# Patient Record
Sex: Female | Born: 1950
Health system: Southern US, Community
[De-identification: ages and names within clinical notes are randomized; demographics above are authoritative.]

## PROBLEM LIST (undated history)

## (undated) DIAGNOSIS — R0989 Other specified symptoms and signs involving the circulatory and respiratory systems: Secondary | ICD-10-CM

## (undated) DIAGNOSIS — K589 Irritable bowel syndrome without diarrhea: Secondary | ICD-10-CM

## (undated) DIAGNOSIS — E079 Disorder of thyroid, unspecified: Secondary | ICD-10-CM

## (undated) DIAGNOSIS — R011 Cardiac murmur, unspecified: Secondary | ICD-10-CM

## (undated) DIAGNOSIS — E785 Hyperlipidemia, unspecified: Secondary | ICD-10-CM

## (undated) DIAGNOSIS — E559 Vitamin D deficiency, unspecified: Secondary | ICD-10-CM

## (undated) DIAGNOSIS — H3530 Unspecified macular degeneration: Secondary | ICD-10-CM

## (undated) DIAGNOSIS — T7840XA Allergy, unspecified, initial encounter: Secondary | ICD-10-CM

## (undated) DIAGNOSIS — K219 Gastro-esophageal reflux disease without esophagitis: Secondary | ICD-10-CM

## (undated) DIAGNOSIS — D126 Benign neoplasm of colon, unspecified: Secondary | ICD-10-CM

## (undated) DIAGNOSIS — G2581 Restless legs syndrome: Secondary | ICD-10-CM

## (undated) HISTORY — DX: Disorder of thyroid, unspecified: E07.9

## (undated) HISTORY — DX: Irritable bowel syndrome, unspecified: K58.9

## (undated) HISTORY — DX: Hyperlipidemia, unspecified: E78.5

## (undated) HISTORY — PX: TUBAL LIGATION: SHX77

## (undated) HISTORY — PX: POLYPECTOMY: SHX149

## (undated) HISTORY — DX: Benign neoplasm of colon, unspecified: D12.6

## (undated) HISTORY — DX: Gastro-esophageal reflux disease without esophagitis: K21.9

## (undated) HISTORY — DX: Restless legs syndrome: G25.81

## (undated) HISTORY — PX: COLONOSCOPY: SHX174

## (undated) HISTORY — DX: Cardiac murmur, unspecified: R01.1

## (undated) HISTORY — DX: Vitamin D deficiency, unspecified: E55.9

## (undated) HISTORY — DX: Other specified symptoms and signs involving the circulatory and respiratory systems: R09.89

## (undated) HISTORY — DX: Unspecified macular degeneration: H35.30

## (undated) HISTORY — DX: Allergy, unspecified, initial encounter: T78.40XA

---

## 1998-04-22 ENCOUNTER — Ambulatory Visit (HOSPITAL_COMMUNITY): Admission: RE | Admit: 1998-04-22 | Discharge: 1998-04-22 | Payer: Self-pay | Admitting: Internal Medicine

## 1998-07-10 ENCOUNTER — Other Ambulatory Visit: Admission: RE | Admit: 1998-07-10 | Discharge: 1998-07-10 | Payer: Self-pay | Admitting: Obstetrics & Gynecology

## 1999-07-24 ENCOUNTER — Other Ambulatory Visit: Admission: RE | Admit: 1999-07-24 | Discharge: 1999-07-24 | Payer: Self-pay | Admitting: Obstetrics & Gynecology

## 2000-05-14 ENCOUNTER — Encounter: Payer: Self-pay | Admitting: Internal Medicine

## 2000-05-14 ENCOUNTER — Ambulatory Visit (HOSPITAL_COMMUNITY): Admission: RE | Admit: 2000-05-14 | Discharge: 2000-05-14 | Payer: Self-pay | Admitting: Internal Medicine

## 2000-08-05 ENCOUNTER — Other Ambulatory Visit: Admission: RE | Admit: 2000-08-05 | Discharge: 2000-08-05 | Payer: Self-pay | Admitting: Obstetrics & Gynecology

## 2000-09-29 ENCOUNTER — Encounter: Admission: RE | Admit: 2000-09-29 | Discharge: 2000-09-29 | Payer: Self-pay | Admitting: Urology

## 2000-09-29 ENCOUNTER — Encounter: Payer: Self-pay | Admitting: Urology

## 2001-07-27 ENCOUNTER — Other Ambulatory Visit: Admission: RE | Admit: 2001-07-27 | Discharge: 2001-07-27 | Payer: Self-pay | Admitting: Obstetrics & Gynecology

## 2002-07-31 ENCOUNTER — Other Ambulatory Visit: Admission: RE | Admit: 2002-07-31 | Discharge: 2002-07-31 | Payer: Self-pay | Admitting: Obstetrics & Gynecology

## 2003-08-17 ENCOUNTER — Other Ambulatory Visit: Admission: RE | Admit: 2003-08-17 | Discharge: 2003-08-17 | Payer: Self-pay | Admitting: Obstetrics & Gynecology

## 2003-08-24 ENCOUNTER — Ambulatory Visit (HOSPITAL_COMMUNITY): Admission: RE | Admit: 2003-08-24 | Discharge: 2003-08-24 | Payer: Self-pay | Admitting: Internal Medicine

## 2003-08-24 ENCOUNTER — Encounter: Payer: Self-pay | Admitting: Internal Medicine

## 2004-08-29 ENCOUNTER — Other Ambulatory Visit: Admission: RE | Admit: 2004-08-29 | Discharge: 2004-08-29 | Payer: Self-pay | Admitting: Obstetrics & Gynecology

## 2005-09-08 ENCOUNTER — Other Ambulatory Visit: Admission: RE | Admit: 2005-09-08 | Discharge: 2005-09-08 | Payer: Self-pay | Admitting: Obstetrics & Gynecology

## 2007-08-03 ENCOUNTER — Ambulatory Visit: Payer: Self-pay | Admitting: Internal Medicine

## 2007-08-16 ENCOUNTER — Ambulatory Visit: Payer: Self-pay | Admitting: Internal Medicine

## 2012-07-05 ENCOUNTER — Encounter: Payer: Self-pay | Admitting: Internal Medicine

## 2012-07-11 ENCOUNTER — Encounter: Payer: Self-pay | Admitting: *Deleted

## 2012-08-12 ENCOUNTER — Ambulatory Visit (AMBULATORY_SURGERY_CENTER): Payer: BC Managed Care – PPO | Admitting: *Deleted

## 2012-08-12 ENCOUNTER — Encounter: Payer: Self-pay | Admitting: Internal Medicine

## 2012-08-12 VITALS — Ht 63.0 in | Wt 129.0 lb

## 2012-08-12 DIAGNOSIS — Z1211 Encounter for screening for malignant neoplasm of colon: Secondary | ICD-10-CM

## 2012-08-12 MED ORDER — MOVIPREP 100 G PO SOLR: ORAL | Status: DC

## 2012-08-23 ENCOUNTER — Telehealth: Payer: Self-pay | Admitting: Internal Medicine

## 2012-08-23 NOTE — Telephone Encounter (Signed)
Spoke with patient and reviewed with her certain foods she could eat and should avoid prior to her colonoscopy.She states she understood.She will call back if she has further questions.

## 2012-08-30 ENCOUNTER — Encounter: Payer: Self-pay | Admitting: Internal Medicine

## 2012-08-30 ENCOUNTER — Ambulatory Visit (AMBULATORY_SURGERY_CENTER): Payer: BC Managed Care – PPO | Admitting: Internal Medicine

## 2012-08-30 VITALS — BP 128/62 | HR 58 | Temp 98.4°F | Resp 20 | Ht 63.0 in | Wt 129.0 lb

## 2012-08-30 DIAGNOSIS — D126 Benign neoplasm of colon, unspecified: Secondary | ICD-10-CM

## 2012-08-30 DIAGNOSIS — Z86010 Personal history of colonic polyps: Secondary | ICD-10-CM

## 2012-08-30 DIAGNOSIS — Z1211 Encounter for screening for malignant neoplasm of colon: Secondary | ICD-10-CM

## 2012-08-30 LAB — HM COLONOSCOPY

## 2012-08-30 MED ORDER — SODIUM CHLORIDE 0.9 % IV SOLN: 500.0000 mL | INTRAVENOUS | Status: DC

## 2012-08-30 NOTE — Progress Notes (Signed)
Patient did not experience any of the following events: a burn prior to discharge; a fall within the facility; wrong site/side/patient/procedure/implant event; or a hospital transfer or hospital admission upon discharge from the facility. (G8907) Patient did not have preoperative order for IV antibiotic SSI prophylaxis. (G8918)  

## 2012-08-30 NOTE — Op Note (Signed)
[facility] [address] [place], [address]   COLONOSCOPY PROCEDURE REPORT  PATIENT: Sarah Hartman, Sarah Hartman  MR#: [id number] BIRTHDATE: Sep 29, 1951 , 61  yrs. old GENDER: Female ENDOSCOPIST: Roxy Cedar, MD REFERRED XB:[id number] Program Recall PROCEDURE DATE:  08/30/2012 PROCEDURE:   Colonoscopy with snare polypectomy  x 2 ASA CLASS:   Class II INDICATIONS:patient's personal history of adenomatous colon polyps (index 2004 w/ TA; f/u 2008 w/ polyps). MEDICATIONS: MAC sedation, administered by CRNA and propofol (Diprivan) 200mg  IV  DESCRIPTION OF PROCEDURE:   After the risks benefits and alternatives of the procedure were thoroughly explained, informed consent was obtained.  A digital rectal exam revealed no abnormalities of the rectum.   The LB [id number]  endoscope was introduced through the anus and advanced to the cecum, which was identified by both the appendix and ileocecal valve. No adverse events experienced.   The quality of the prep was excellent, using MoviPrep  The instrument was then slowly withdrawn as the colon was fully examined.      COLON FINDINGS: Two diminutive polyps were found in the descending colon and sigmoid colon.  A polypectomy was performed with a cold snare.  The resection was complete and the polyp tissue was completely retrieved.   Mild diverticulosis was noted in the ascending colon.  Retroflexed views revealed no abnormalities. The time to cecum=3 minutes 50 seconds.  Withdrawal time=9 minutes 31 seconds.  The scope was withdrawn and the procedure completed. COMPLICATIONS: There were no complications.  ENDOSCOPIC IMPRESSION: 1.   Two diminutive polyps were found in the descending colon and sigmoid colon; polypectomy was performed with a cold snare 2.   Mild diverticulosis was noted in the ascending colon  RECOMMENDATIONS: 1. Follow up colonoscopy in 5 years   eSigned:  Roxy Cedar, MD 08/30/2012 10:14  AM   cc: Lucky Cowboy, MD and The Patient   PATIENT NAME:  Sarah Hartman, Sarah Hartman MR#: [id number]

## 2012-08-30 NOTE — Patient Instructions (Signed)
YOU HAD AN ENDOSCOPIC PROCEDURE TODAY AT THE [facility]: Refer to the procedure report that was given to you for any specific questions about what was found during the examination.  If the procedure report does not answer your questions, please call your gastroenterologist to clarify.  If you requested that your care partner not be given the details of your procedure findings, then the procedure report has been included in a sealed envelope for you to review at your convenience later.  YOU SHOULD EXPECT: Some feelings of bloating in the abdomen. Passage of more gas than usual.  Walking can help get rid of the air that was put into your GI tract during the procedure and reduce the bloating. If you had a lower endoscopy (such as a colonoscopy or flexible sigmoidoscopy) you may notice spotting of blood in your stool or on the toilet paper. If you underwent a bowel prep for your procedure, then you may not have a normal bowel movement for a few days.  DIET: Your first meal following the procedure should be a light meal and then it is ok to progress to your normal diet.  A half-sandwich or bowl of soup is an example of a good first meal.  Heavy or fried foods are harder to digest and may make you feel nauseous or bloated.  Likewise meals heavy in dairy and vegetables can cause extra gas to form and this can also increase the bloating.  Drink plenty of fluids but you should avoid alcoholic beverages for 24 hours.  ACTIVITY: Your care partner should take you home directly after the procedure.  You should plan to take it easy, moving slowly for the rest of the day.  You can resume normal activity the day after the procedure however you should NOT DRIVE or use heavy machinery for 24 hours (because of the sedation medicines used during the test).    SYMPTOMS TO REPORT IMMEDIATELY: A gastroenterologist can be reached at any hour.  During normal business hours, 8:30 AM to 5:00 PM Monday through Friday,  call [phone].  After hours and on weekends, please call the GI answering service at [phone] who will take a message and have the physician on call contact you.   Following lower endoscopy (colonoscopy or flexible sigmoidoscopy):  Excessive amounts of blood in the stool  Significant tenderness or worsening of abdominal pains  Swelling of the abdomen that is new, acute  Fever of 100F or higher    FOLLOW UP: If any biopsies were taken you will be contacted by phone or by letter within the next 1-3 weeks.  Call your gastroenterologist if you have not heard about the biopsies in 3 weeks.  Our staff will call the home number listed on your records the next business day following your procedure to check on you and address any questions or concerns that you may have at that time regarding the information given to you following your procedure. This is a courtesy call and so if there is no answer at the home number and we have not heard from you through the emergency physician on call, we will assume that you have returned to your regular daily activities without incident.  SIGNATURES/CONFIDENTIALITY: You and/or your care partner have signed paperwork which will be entered into your electronic medical record.  These signatures attest to the fact that that the information above on your After Visit Summary has been reviewed and is understood.  Full responsibility of the confidentiality   of this discharge information lies with you and/or your care-partner.     

## 2012-08-31 ENCOUNTER — Telehealth: Payer: Self-pay

## 2012-08-31 NOTE — Telephone Encounter (Signed)
  Follow up Call-  Call back number 08/30/2012  Post procedure Call Back phone  # [phone]  Permission to leave phone message Yes     Patient questions:  Do you have a fever, pain , or abdominal swelling? no Pain Score  0 *  Have you tolerated food without any problems? yes  Have you been able to return to your normal activities? yes  Do you have any questions about your discharge instructions: Diet   no Medications  no Follow up visit  no  Do you have questions or concerns about your Care? no  Actions: * If pain score is 4 or above: No action needed, pain <4.  Per the pt, "everyone there was wonderful". Maw

## 2012-09-06 ENCOUNTER — Encounter: Payer: Self-pay | Admitting: Internal Medicine

## 2013-02-03 ENCOUNTER — Other Ambulatory Visit: Payer: Self-pay | Admitting: Orthopedic Surgery

## 2013-02-03 DIAGNOSIS — G8929 Other chronic pain: Secondary | ICD-10-CM

## 2013-02-16 ENCOUNTER — Ambulatory Visit
Admission: RE | Admit: 2013-02-16 | Discharge: 2013-02-16 | Disposition: A | Discharge status: Home / Self Care | Payer: BC Managed Care – PPO | Source: Ambulatory Visit | Attending: Orthopedic Surgery | Admitting: Orthopedic Surgery

## 2013-02-16 DIAGNOSIS — G8929 Other chronic pain: Secondary | ICD-10-CM

## 2013-02-16 DIAGNOSIS — M25521 Pain in right elbow: Secondary | ICD-10-CM

## 2013-04-24 ENCOUNTER — Encounter (HOSPITAL_BASED_OUTPATIENT_CLINIC_OR_DEPARTMENT_OTHER): Payer: Self-pay | Admitting: *Deleted

## 2013-04-24 NOTE — Progress Notes (Signed)
No labs needed

## 2013-04-24 NOTE — H&P (Signed)
[place] [address] [phone] A Division of [facility]  Loreta Ave, M.D.   Robert A. Thurston Hole, M.D.   Burnell Blanks, M.D.   Eulas Post, M.D.   Lunette Stands, M.D Buford Dresser, M.D.  Charlsie Quest, M.D.   Estell Harpin, M.D.   Melina Fiddler, M.D. Genene Churn. Barry Dienes, PA-C            Kirstin A. Shepperson, PA-C Josh [place], PA-C [place], [place]   RE: Sarah, Hartman   [id number]      DOB: 25-Jan-1951 PROGRESS NOTE: 02-03-13 Chief complaint: right elbow pain. History of present illness: 62 year old white female with right elbow pain for about a year worse in the last few months. No injury that she can recall. Pain when gripping objects and using a mouse on a computer. Pain occasionally wakes her at night. Pain to the lateral elbow but she does have discomfort proximal forearm dull aching sensation towards her wrist. She has some right hand numbness and tingling and feeling of grip weakness. No cervical spine issues. No symptoms on the left. Current medications: Synthroid. Drug allergy to codeine. Past medical/surgical history: hypothyroid.  Family history: positive diabetes hypertension heart disease.  Social history: she is divorced employed as a Catering manager. Admits occasional alcohol use denies smoking.  Review of systems: all other systems unremarkable.   EXAMINATION: Alert and oriented x3 in no acute distress. Cervical spine and bilateral shoulder exam unremarkable. Both elbows have good range of motion. Right elbow positive Tinel's over the cubital tunnel. She has exquisite tenderness over the lateral epicondyle. No tendon defect. She has right greater than left tenderness over the radial tunnels. Left wrist has positive Tinel's negative Phalen's. Negative elbow flexion test. She's neurovascularly intact. Skin warm and dry. No increase in respiratory effort.   X-RAYS: Right elbow AP lateral and oblique views show a fair amount  of calcific changes at the lateral epicondyle. No acute changes.  IMPRESSION: Right elbow pain secondary to lateral epicondylitis with calcific tendinopathy, possible cubital radial and carpal tunnel syndrome.   DISPOSITION: With the amount of calcific changes she has and worsening symptoms we'll schedule MRI to evaluate her common extensor tendon. Follow up after MRI to delineate therapeutic recommendations. She may need EMG NCV study if she continues to have nerve irritation.   Loreta Ave, M.D.  Electronically verified by Loreta Ave, M.D. DFM(JMO):kh D 02-06-13 T 02-07-13  [facility] [address]   [address] [place], [place] [address] [phone] A Division of [facility]  Loreta Ave, M.D.   Robert A. Thurston Hole, M.D.   Burnell Blanks, M.D.   Eulas Post, M.D.   Lunette Stands, M.D Buford Dresser, M.D.  Charlsie Quest, M.D.   Estell Harpin, M.D.   Melina Fiddler, M.D. Genene Churn. Barry Dienes, PA-C            Kirstin A. Shepperson, PA-C Josh [place], PA-C [place], [place]   RE: Sarah, Hartman                                [id number]      DOB: Jul 03, 1951 PROGRESS NOTE: 02-21-13 [age] year-old white female with a history of right elbow pain.  Returns for review of her MRI scan performed on February 16, 2013.  Scan showed lateral epicondylitis with calcific  tendinopathy and interstitial tearing of the ECRB and tendinosis of the extensor digitorum communis.  Negative for ligament tear.  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  Elbow symptoms unchanged from previous visit.  Advised patient the only viable option at this point would be right lateral elbow exploration, debridement and repair of common extensor tendon.  Surgical procedure, along with potential rehab/recovery time discussed.  All questions answered.  Pre-op paperwork filled out.   Loreta Ave, M.D.     Electronically verified by Loreta Ave, M.D. DFM(JMO):jjh D 02-22-13 T 02-23-13

## 2013-04-26 ENCOUNTER — Other Ambulatory Visit: Payer: Self-pay | Admitting: Orthopedic Surgery

## 2013-04-27 ENCOUNTER — Encounter (HOSPITAL_BASED_OUTPATIENT_CLINIC_OR_DEPARTMENT_OTHER): Payer: Self-pay | Admitting: Anesthesiology

## 2013-04-27 ENCOUNTER — Encounter (HOSPITAL_BASED_OUTPATIENT_CLINIC_OR_DEPARTMENT_OTHER)
Admission: RE | Disposition: A | Discharge status: Home / Self Care | Payer: Self-pay | Source: Ambulatory Visit | Attending: Orthopedic Surgery

## 2013-04-27 ENCOUNTER — Ambulatory Visit (HOSPITAL_BASED_OUTPATIENT_CLINIC_OR_DEPARTMENT_OTHER)
Admission: RE | Admit: 2013-04-27 | Discharge: 2013-04-27 | Disposition: A | Discharge status: Home / Self Care | Payer: BC Managed Care – PPO | Source: Ambulatory Visit | Attending: Orthopedic Surgery | Admitting: Orthopedic Surgery

## 2013-04-27 ENCOUNTER — Ambulatory Visit (HOSPITAL_BASED_OUTPATIENT_CLINIC_OR_DEPARTMENT_OTHER): Payer: BC Managed Care – PPO | Admitting: Anesthesiology

## 2013-04-27 DIAGNOSIS — M6520 Calcific tendinitis, unspecified site: Secondary | ICD-10-CM | POA: Insufficient documentation

## 2013-04-27 DIAGNOSIS — M249 Joint derangement, unspecified: Secondary | ICD-10-CM | POA: Insufficient documentation

## 2013-04-27 DIAGNOSIS — E039 Hypothyroidism, unspecified: Secondary | ICD-10-CM | POA: Insufficient documentation

## 2013-04-27 DIAGNOSIS — Z885 Allergy status to narcotic agent status: Secondary | ICD-10-CM | POA: Insufficient documentation

## 2013-04-27 DIAGNOSIS — M7710 Lateral epicondylitis, unspecified elbow: Secondary | ICD-10-CM | POA: Insufficient documentation

## 2013-04-27 HISTORY — PX: TENDON RECONSTRUCTION: SHX2487

## 2013-04-27 LAB — POCT HEMOGLOBIN-HEMACUE: Hemoglobin: 14.9 g/dL (ref 12.0–15.0)

## 2013-04-27 SURGERY — RECONSTRUCTION, TENDON OR LIGAMENT, ELBOW
Anesthesia: General | Site: Elbow | Laterality: Right | Wound class: Clean

## 2013-04-27 MED ORDER — LIDOCAINE HCL 1 % IJ SOLN: INTRAMUSCULAR | Status: DC | PRN

## 2013-04-27 MED ORDER — DEXAMETHASONE SODIUM PHOSPHATE 4 MG/ML IJ SOLN
INTRAMUSCULAR | Status: DC | PRN
  Administered 2013-04-27: 10 mg via INTRAVENOUS

## 2013-04-27 MED ORDER — METOCLOPRAMIDE HCL 5 MG/ML IJ SOLN: 5.0000 mg | Freq: Three times a day (TID) | INTRAMUSCULAR | Status: DC | PRN

## 2013-04-27 MED ORDER — ONDANSETRON HCL 4 MG/2ML IJ SOLN: 4.0000 mg | Freq: Four times a day (QID) | INTRAMUSCULAR | Status: DC | PRN

## 2013-04-27 MED ORDER — ONDANSETRON HCL 4 MG PO TABS: 4.0000 mg | ORAL_TABLET | Freq: Four times a day (QID) | ORAL | Status: DC | PRN

## 2013-04-27 MED ORDER — LACTATED RINGERS IV SOLN: INTRAVENOUS | Status: DC

## 2013-04-27 MED ORDER — LIDOCAINE HCL (CARDIAC) 20 MG/ML IV SOLN
INTRAVENOUS | Status: DC | PRN
  Administered 2013-04-27: 50 mg via INTRAVENOUS

## 2013-04-27 MED ORDER — OXYCODONE HCL 5 MG PO TABS: 5.0000 mg | ORAL_TABLET | Freq: Once | ORAL | Status: DC | PRN

## 2013-04-27 MED ORDER — HYDROMORPHONE HCL PF 1 MG/ML IJ SOLN: 0.2500 mg | INTRAMUSCULAR | Status: DC | PRN

## 2013-04-27 MED ORDER — METHOCARBAMOL 500 MG PO TABS: 500.0000 mg | ORAL_TABLET | Freq: Four times a day (QID) | ORAL | Status: DC

## 2013-04-27 MED ORDER — MIDAZOLAM HCL 2 MG/2ML IJ SOLN
1.0000 mg | INTRAMUSCULAR | Status: DC | PRN
  Administered 2013-04-27: 2 mg via INTRAVENOUS

## 2013-04-27 MED ORDER — ONDANSETRON HCL 4 MG/2ML IJ SOLN
INTRAMUSCULAR | Status: DC | PRN
  Administered 2013-04-27: 4 mg via INTRAVENOUS

## 2013-04-27 MED ORDER — FENTANYL CITRATE 0.05 MG/ML IJ SOLN: 50.0000 ug | INTRAMUSCULAR | Status: DC | PRN

## 2013-04-27 MED ORDER — METOCLOPRAMIDE HCL 5 MG PO TABS: 5.0000 mg | ORAL_TABLET | Freq: Three times a day (TID) | ORAL | Status: DC | PRN

## 2013-04-27 MED ORDER — FENTANYL CITRATE 0.05 MG/ML IJ SOLN
50.0000 ug | INTRAMUSCULAR | Status: DC | PRN
  Administered 2013-04-27: 100 ug via INTRAVENOUS

## 2013-04-27 MED ORDER — 0.9 % SODIUM CHLORIDE (POUR BTL) OPTIME
TOPICAL | Status: DC | PRN
  Administered 2013-04-27: 200 mL

## 2013-04-27 MED ORDER — METOCLOPRAMIDE HCL 5 MG/ML IJ SOLN
INTRAMUSCULAR | Status: DC | PRN
  Administered 2013-04-27: 10 mg via INTRAVENOUS

## 2013-04-27 MED ORDER — EPHEDRINE SULFATE 50 MG/ML IJ SOLN
INTRAMUSCULAR | Status: DC | PRN
  Administered 2013-04-27 (×2): 10 mg via INTRAVENOUS

## 2013-04-27 MED ORDER — OXYCODONE HCL 5 MG/5ML PO SOLN: 5.0000 mg | Freq: Once | ORAL | Status: DC | PRN

## 2013-04-27 MED ORDER — CEFAZOLIN SODIUM-DEXTROSE 2-3 GM-% IV SOLR
2.0000 g | INTRAVENOUS | Status: AC
  Administered 2013-04-27: 2 g via INTRAVENOUS

## 2013-04-27 MED ORDER — METOCLOPRAMIDE HCL 5 MG/ML IJ SOLN
10.0000 mg | Freq: Once | INTRAMUSCULAR | Status: AC | PRN
  Administered 2013-04-27: 10 mg via INTRAVENOUS

## 2013-04-27 MED ORDER — PROPOFOL 10 MG/ML IV BOLUS
INTRAVENOUS | Status: DC | PRN
  Administered 2013-04-27: 150 mg via INTRAVENOUS

## 2013-04-27 MED ORDER — LACTATED RINGERS IV SOLN
INTRAVENOUS | Status: DC
  Administered 2013-04-27 (×3): via INTRAVENOUS

## 2013-04-27 MED ORDER — CHLORHEXIDINE GLUCONATE 4 % EX LIQD: 60.0000 mL | Freq: Once | CUTANEOUS | Status: DC

## 2013-04-27 MED ORDER — OXYCODONE-ACETAMINOPHEN 10-325 MG PO TABS: 1.0000 | ORAL_TABLET | Freq: Four times a day (QID) | ORAL | Status: DC | PRN

## 2013-04-27 MED ORDER — MIDAZOLAM HCL 2 MG/2ML IJ SOLN: 1.0000 mg | INTRAMUSCULAR | Status: DC | PRN

## 2013-04-27 MED ORDER — LIDOCAINE HCL 1 % IJ SOLN
INTRAMUSCULAR | Status: DC | PRN
  Administered 2013-04-27: 2 mL via INTRADERMAL

## 2013-04-27 MED ORDER — ROPIVACAINE HCL 5 MG/ML IJ SOLN
INTRAMUSCULAR | Status: DC | PRN
  Administered 2013-04-27: 25 mL

## 2013-04-27 SURGICAL SUPPLY — 68 items
APL SKNCLS STERI-STRIP NONHPOA (GAUZE/BANDAGES/DRESSINGS) ×1
BANDAGE ELASTIC 4 VELCRO ST LF (GAUZE/BANDAGES/DRESSINGS) ×4 IMPLANT
BENZOIN TINCTURE PRP APPL 2/3 (GAUZE/BANDAGES/DRESSINGS) ×1 IMPLANT
BLADE SURG 15 STRL LF DISP TIS (BLADE) ×1 IMPLANT
BLADE SURG 15 STRL SS (BLADE) ×2
BNDG CMPR 9X4 STRL LF SNTH (GAUZE/BANDAGES/DRESSINGS) ×1
BNDG COHESIVE 4X5 TAN STRL (GAUZE/BANDAGES/DRESSINGS) ×2 IMPLANT
BNDG ESMARK 4X9 LF (GAUZE/BANDAGES/DRESSINGS) ×2 IMPLANT
CANISTER SUCTION 1200CC (MISCELLANEOUS) ×2 IMPLANT
CLOTH BEACON ORANGE TIMEOUT ST (SAFETY) ×2 IMPLANT
COVER TABLE BACK 60X90 (DRAPES) ×2 IMPLANT
CUFF TOURNIQUET SINGLE 18IN (TOURNIQUET CUFF) IMPLANT
DECANTER SPIKE VIAL GLASS SM (MISCELLANEOUS) IMPLANT
DRAPE EXTREMITY T 121X128X90 (DRAPE) ×2 IMPLANT
DRAPE OEC MINIVIEW 54X84 (DRAPES) ×1 IMPLANT
DRAPE U 20/CS (DRAPES) ×2 IMPLANT
DRAPE U-SHAPE 47X51 STRL (DRAPES) ×2 IMPLANT
DRSG PAD ABDOMINAL 8X10 ST (GAUZE/BANDAGES/DRESSINGS) ×2 IMPLANT
DURAPREP 26ML APPLICATOR (WOUND CARE) ×2 IMPLANT
ELECT REM PT RETURN 9FT ADLT (ELECTROSURGICAL) ×2
ELECTRODE REM PT RTRN 9FT ADLT (ELECTROSURGICAL) ×1 IMPLANT
GAUZE XEROFORM 1X8 LF (GAUZE/BANDAGES/DRESSINGS) IMPLANT
GLOVE BIO SURGEON STRL SZ 6.5 (GLOVE) ×2 IMPLANT
GLOVE BIO SURGEON STRL SZ7 (GLOVE) ×1 IMPLANT
GLOVE BIOGEL PI IND STRL 7.0 (GLOVE) IMPLANT
GLOVE BIOGEL PI IND STRL 7.5 (GLOVE) IMPLANT
GLOVE BIOGEL PI INDICATOR 7.0 (GLOVE) ×2
GLOVE BIOGEL PI INDICATOR 7.5 (GLOVE) ×1
GLOVE ORTHO TXT STRL SZ7.5 (GLOVE) ×4 IMPLANT
GOWN BRE IMP PREV XXLGXLNG (GOWN DISPOSABLE) ×1 IMPLANT
GOWN PREVENTION PLUS XLARGE (GOWN DISPOSABLE) ×4 IMPLANT
K-WIRE .045X4 (WIRE) ×2 IMPLANT
NDL 1/2 CIR CATGUT .05X1.09 (NEEDLE) IMPLANT
NDL HYPO 25X1 1.5 SAFETY (NEEDLE) IMPLANT
NEEDLE 1/2 CIR CATGUT .05X1.09 (NEEDLE) IMPLANT
NEEDLE HYPO 25X1 1.5 SAFETY (NEEDLE) IMPLANT
NS IRRIG 1000ML POUR BTL (IV SOLUTION) ×2 IMPLANT
PACK BASIN DAY SURGERY FS (CUSTOM PROCEDURE TRAY) ×2 IMPLANT
PAD CAST 4YDX4 CTTN HI CHSV (CAST SUPPLIES) ×2 IMPLANT
PADDING CAST ABS 4INX4YD NS (CAST SUPPLIES) ×1
PADDING CAST ABS COTTON 4X4 ST (CAST SUPPLIES) ×1 IMPLANT
PADDING CAST COTTON 4X4 STRL (CAST SUPPLIES) ×4
PENCIL BUTTON HOLSTER BLD 10FT (ELECTRODE) ×2 IMPLANT
SLEEVE SCD COMPRESS KNEE MED (MISCELLANEOUS) ×1 IMPLANT
SLING ARM FOAM STRAP LRG (SOFTGOODS) ×1 IMPLANT
SLING ARM FOAM STRAP MED (SOFTGOODS) IMPLANT
SPEAR FASTAKII (SLEEVE) ×2 IMPLANT
SPLINT FAST PLASTER 5X30 (CAST SUPPLIES) ×10
SPLINT PLASTER CAST FAST 5X30 (CAST SUPPLIES) ×10 IMPLANT
SPONGE GAUZE 4X4 12PLY (GAUZE/BANDAGES/DRESSINGS) ×2 IMPLANT
STOCKINETTE 4X48 STRL (DRAPES) ×2 IMPLANT
STRIP CLOSURE SKIN 1/2X4 (GAUZE/BANDAGES/DRESSINGS) ×2 IMPLANT
SUCTION FRAZIER TIP 10 FR DISP (SUCTIONS) ×2 IMPLANT
SUT 2 FIBERLOOP 20 STRT BLUE (SUTURE)
SUT FIBERWIRE #2 38 T-5 BLUE (SUTURE)
SUT MNCRL AB 3-0 PS2 18 (SUTURE) ×2 IMPLANT
SUT VIC AB 0 CT1 27 (SUTURE) ×2
SUT VIC AB 0 CT1 27XBRD ANBCTR (SUTURE) IMPLANT
SUT VIC AB 2-0 SH 27 (SUTURE) ×2
SUT VIC AB 2-0 SH 27XBRD (SUTURE) ×1 IMPLANT
SUT VIC AB 3-0 SH 18 (SUTURE) ×1 IMPLANT
SUTURE 2 FIBERLOOP 20 STRT BLU (SUTURE) IMPLANT
SUTURE FIBERWR #2 38 T-5 BLUE (SUTURE) ×1 IMPLANT
SYR BULB 3OZ (MISCELLANEOUS) ×2 IMPLANT
TOWEL OR 17X24 6PK STRL BLUE (TOWEL DISPOSABLE) ×2 IMPLANT
TOWEL OR NON WOVEN STRL DISP B (DISPOSABLE) ×2 IMPLANT
TUBE CONNECTING 20X1/4 (TUBING) ×2 IMPLANT
UNDERPAD 30X30 INCONTINENT (UNDERPADS AND DIAPERS) ×2 IMPLANT

## 2013-04-27 NOTE — Progress Notes (Signed)
Assisted Dr. Frederick with right, ultrasound guided, supraclavicular block. Side rails up, monitors on throughout procedure. See vital signs in flow sheet. Tolerated Procedure well. 

## 2013-04-27 NOTE — Interval H&P Note (Signed)
History and Physical Interval Note:  04/27/2013 7:30 AM  Sarah Hartman  has presented today for surgery, with the diagnosis of RIGHT ELBOW LATERAL EPICONYLITIS  The various methods of treatment have been discussed with the patient and family. After consideration of risks, benefits and other options for treatment, the patient has consented to  Lateral elbow debridement and repair right as a surgical intervention .  The patient's history has been reviewed, patient examined, no change in status, stable for surgery.  I have reviewed the patient's chart and labs.  Questions were answered to the patient's satisfaction.     MURPHY,DANIEL F

## 2013-04-27 NOTE — Anesthesia Postprocedure Evaluation (Signed)
Anesthesia Post Note  Patient: Sarah Hartman  Procedure(s) Performed: Procedure(s) (LRB): RIGHT ELBOW DEBRIDE/REPAIR/EXPLORE - LATERAL HUMERAL EPICONDYLE (Right)  Anesthesia type: General  Patient location: PACU  Post pain: Pain level controlled  Post assessment: Patient's Cardiovascular Status Stable  Last Vitals:  Filed Vitals:   04/27/13 1248  BP: 120/59  Pulse: 74  Temp: 36.3 C  Resp: 16    Post vital signs: Reviewed and stable  Level of consciousness: alert  Complications: No apparent anesthesia complications

## 2013-04-27 NOTE — Anesthesia Preprocedure Evaluation (Signed)
Anesthesia Evaluation  Patient identified by MRN, date of birth, ID band Patient awake    Reviewed: Allergy & Precautions, H&P , NPO status , Patient's Chart, lab work & pertinent test results, reviewed documented beta blocker date and time   Airway Mallampati: II TM Distance: >3 FB Neck ROM: full    Dental   Pulmonary neg pulmonary ROS,  breath sounds clear to auscultation        Cardiovascular negative cardio ROS  Rhythm:regular     Neuro/Psych negative neurological ROS  negative psych ROS   GI/Hepatic negative GI ROS, Neg liver ROS, GERD-  Medicated and Controlled,  Endo/Other  Hypothyroidism   Renal/GU negative Renal ROS  negative genitourinary   Musculoskeletal   Abdominal   Peds  Hematology negative hematology ROS (+)   Anesthesia Other Findings See surgeon's H&P   Reproductive/Obstetrics negative OB ROS                           Anesthesia Physical Anesthesia Plan  ASA: II  Anesthesia Plan: General   Post-op Pain Management:    Induction: Intravenous  Airway Management Planned: LMA  Additional Equipment:   Intra-op Plan:   Post-operative Plan:   Informed Consent: I have reviewed the patients History and Physical, chart, labs and discussed the procedure including the risks, benefits and alternatives for the proposed anesthesia with the patient or authorized representative who has indicated his/her understanding and acceptance.   Dental Advisory Given  Plan Discussed with: CRNA and Surgeon  Anesthesia Plan Comments:         Anesthesia Quick Evaluation

## 2013-04-27 NOTE — Transfer of Care (Signed)
Immediate Anesthesia Transfer of Care Note  Patient: Sarah Hartman  Procedure(s) Performed: Procedure(s): RIGHT ELBOW DEBRIDE/REPAIR/EXPLORE - LATERAL HUMERAL EPICONDYLE (Right)  Patient Location: PACU  Anesthesia Type:General  Level of Consciousness: awake and alert   Airway & Oxygen Therapy: Patient Spontanous Breathing and Patient connected to face mask oxygen  Post-op Assessment: Report given to PACU RN and Post -op Vital signs reviewed and stable  Post vital signs: Reviewed and stable  Complications: No apparent anesthesia complications

## 2013-04-27 NOTE — Anesthesia Procedure Notes (Signed)
Anesthesia Regional Block:  Supraclavicular block  Pre-Anesthetic Checklist: ,, timeout performed, Correct Patient, Correct Site, Correct Laterality, Correct Procedure, Correct Position, site marked, Risks and benefits discussed,  Surgical consent,  Pre-op evaluation,  At surgeon's request and post-op pain management  Laterality: Right  Prep: chloraprep       Needles:   Needle Type: Other     Needle Length: 9cm  Needle Gauge: 21    Additional Needles:  Procedures: ultrasound guided (picture in chart) Supraclavicular block Narrative:  Start time: 04/27/2013 8:52 AM End time: 04/27/2013 8:57 AM Injection made incrementally with aspirations every 5 mL.  Performed by: Personally  Anesthesiologist: C Marcie Shearon  Additional Notes: Ultrasound guidance used to: id relevant anatomy, confirm needle position, local anesthetic spread, avoidance of vascular puncture. Picture saved. No complications. Block performed personally by Janetta Hora. Gelene Mink, MD    Supraclavicular block

## 2013-04-28 ENCOUNTER — Encounter (HOSPITAL_BASED_OUTPATIENT_CLINIC_OR_DEPARTMENT_OTHER): Payer: Self-pay | Admitting: Orthopedic Surgery

## 2013-04-28 NOTE — Op Note (Signed)
Sarah Hartman, HOOK NO.:  [id number]  MEDICAL RECORD NO.:  [id number]  LOCATION:                               FACILITY:  [facility]  PHYSICIAN:  Loreta Ave, M.D. 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  POSTOPERATIVE DIAGNOSIS:  Chronic lateral epicondylitis with calcific tendinopathy tearing, ECRB tendon.  PROCEDURE:  Right elbow exploration, excision of calcific tendinopathy, debridement and excision of ECRB tendon.  Debridement of lateral epicondyle with multiple drilling.  Repair of superficial extensors over defect.  SURGEON:  Loreta Ave, M.D.  ASSISTANT:  Skip Mayer, PA  ANESTHESIA:  General.  ESTIMATED BLOOD LOSS:  Minimal.  SPECIMENS:  None.  CULTURES:  None.  COMPLICATION:  None.  DRESSINGS:  Soft compressive, long-arm splint.  TOURNIQUET TIME:  45 minutes.  PROCEDURE IN DETAIL:  The patient was brought to the operating room, placed on the operating table in supine position.  After adequate anesthesia had been obtained, tourniquet was applied.  Prepped and draped in usual sterile fashion.  Exsanguinated with elevation of Esmarch.  Tourniquet inflated to 250 mmHg.  Longitudinal incision from the epicondyle distally.  Skin and subcutaneous tissue were divided. Superficial extensors intact and divided longitudinally.  This exposed extensive mucinous degeneration, calcific deposits throughout the entire attachment.  ECRB tendon and lateral capsule.  Taken down a healthy tissue almost to the level of the radial head.  No instability.  Joints inspected without changes there.  All calcific material were removed. Epicondyle was debrided and treated with multiple drilling.  Wound was irrigated.  Fluoroscopy used to confirm adequate  excision of all calcific deposits. Superficial extensors were then closed over the defect with 0 Vicryl. Subcutaneous and subcuticular closure.  Steri-Strips applied.  Sterile compressive dressing applied.  Tourniquet was inflated and removed. Long-arm splint applied.  Anesthesia reversed.  Brought to the recovery room.  Tolerated the surgery well with no complications.     Loreta Ave, M.D.     DFM/MEDQ  D:  04/27/2013  T:  04/28/2013  Job:  [id number]

## 2013-11-14 DIAGNOSIS — K589 Irritable bowel syndrome without diarrhea: Secondary | ICD-10-CM | POA: Insufficient documentation

## 2013-11-14 DIAGNOSIS — H3530 Unspecified macular degeneration: Secondary | ICD-10-CM | POA: Insufficient documentation

## 2013-11-14 DIAGNOSIS — R0989 Other specified symptoms and signs involving the circulatory and respiratory systems: Secondary | ICD-10-CM | POA: Insufficient documentation

## 2013-11-14 DIAGNOSIS — G2581 Restless legs syndrome: Secondary | ICD-10-CM | POA: Insufficient documentation

## 2013-11-14 DIAGNOSIS — K219 Gastro-esophageal reflux disease without esophagitis: Secondary | ICD-10-CM | POA: Insufficient documentation

## 2013-11-15 ENCOUNTER — Encounter: Payer: Self-pay | Admitting: Internal Medicine

## 2013-11-15 ENCOUNTER — Ambulatory Visit (INDEPENDENT_AMBULATORY_CARE_PROVIDER_SITE_OTHER): Payer: BC Managed Care – PPO | Admitting: Internal Medicine

## 2013-11-15 VITALS — BP 132/76 | HR 64 | Temp 97.5°F | Resp 18 | Ht 62.75 in | Wt 133.4 lb

## 2013-11-15 DIAGNOSIS — Z111 Encounter for screening for respiratory tuberculosis: Secondary | ICD-10-CM

## 2013-11-15 DIAGNOSIS — Z1212 Encounter for screening for malignant neoplasm of rectum: Secondary | ICD-10-CM

## 2013-11-15 DIAGNOSIS — R740 Nonspecific elevation of levels of transaminase and lactic acid dehydrogenase [LDH]: Secondary | ICD-10-CM

## 2013-11-15 DIAGNOSIS — E559 Vitamin D deficiency, unspecified: Secondary | ICD-10-CM

## 2013-11-15 DIAGNOSIS — I10 Essential (primary) hypertension: Secondary | ICD-10-CM

## 2013-11-15 DIAGNOSIS — Z113 Encounter for screening for infections with a predominantly sexual mode of transmission: Secondary | ICD-10-CM

## 2013-11-15 DIAGNOSIS — R7402 Elevation of levels of lactic acid dehydrogenase (LDH): Secondary | ICD-10-CM

## 2013-11-15 DIAGNOSIS — R0989 Other specified symptoms and signs involving the circulatory and respiratory systems: Secondary | ICD-10-CM

## 2013-11-15 DIAGNOSIS — Z0000 Encounter for general adult medical examination without abnormal findings: Secondary | ICD-10-CM

## 2013-11-15 DIAGNOSIS — Z79899 Other long term (current) drug therapy: Secondary | ICD-10-CM

## 2013-11-15 LAB — CBC WITH DIFFERENTIAL/PLATELET
BASOS PCT: 0 % (ref 0–1)
Basophils Absolute: 0 10*3/uL (ref 0.0–0.1)
Eosinophils Absolute: 0 10*3/uL (ref 0.0–0.7)
Eosinophils Relative: 1 % (ref 0–5)
HCT: 38 % (ref 36.0–46.0)
HEMOGLOBIN: 13.6 g/dL (ref 12.0–15.0)
Lymphocytes Relative: 47 % — ABNORMAL HIGH (ref 12–46)
Lymphs Abs: 2.9 10*3/uL (ref 0.7–4.0)
MCH: 31.2 pg (ref 26.0–34.0)
MCHC: 35.8 g/dL (ref 30.0–36.0)
MCV: 87.2 fL (ref 78.0–100.0)
MONOS PCT: 6 % (ref 3–12)
Monocytes Absolute: 0.4 10*3/uL (ref 0.1–1.0)
NEUTROS ABS: 2.8 10*3/uL (ref 1.7–7.7)
NEUTROS PCT: 46 % (ref 43–77)
Platelets: 303 10*3/uL (ref 150–400)
RBC: 4.36 MIL/uL (ref 3.87–5.11)
RDW: 13.8 % (ref 11.5–15.5)
WBC: 6.2 10*3/uL (ref 4.0–10.5)

## 2013-11-15 LAB — BASIC METABOLIC PANEL WITH GFR
BUN: 13 mg/dL (ref 6–23)
CHLORIDE: 102 meq/L (ref 96–112)
CO2: 30 meq/L (ref 19–32)
CREATININE: 0.68 mg/dL (ref 0.50–1.10)
Calcium: 10.1 mg/dL (ref 8.4–10.5)
GFR, Est African American: >89 mL/min
GFR, Est Non African American: >89 mL/min
GLUCOSE: 83 mg/dL (ref 70–99)
POTASSIUM: 4.1 meq/L (ref 3.5–5.3)
Sodium: 143 mEq/L (ref 135–145)

## 2013-11-15 LAB — LIPID PANEL
Cholesterol: 202 mg/dL — ABNORMAL HIGH (ref 0–200)
HDL: 76 mg/dL (ref >39)
LDL CALC: 104 mg/dL — AB (ref 0–99)
Total CHOL/HDL Ratio: 2.7 Ratio
Triglycerides: 109 mg/dL (ref <150)
VLDL: 22 mg/dL (ref 0–40)

## 2013-11-15 LAB — HEPATIC FUNCTION PANEL
ALK PHOS: 33 U/L — AB (ref 39–117)
ALT: 16 U/L (ref 0–35)
AST: 22 U/L (ref 0–37)
Albumin: 5 g/dL (ref 3.5–5.2)
BILIRUBIN DIRECT: 0.1 mg/dL (ref 0.0–0.3)
BILIRUBIN INDIRECT: 0.5 mg/dL (ref 0.0–0.9)
BILIRUBIN TOTAL: 0.6 mg/dL (ref 0.3–1.2)
TOTAL PROTEIN: 7.6 g/dL (ref 6.0–8.3)

## 2013-11-15 LAB — HEMOGLOBIN A1C
Hgb A1c MFr Bld: 5.4 % (ref <5.7)
MEAN PLASMA GLUCOSE: 108 mg/dL (ref <117)

## 2013-11-15 LAB — MAGNESIUM: MAGNESIUM: 2 mg/dL (ref 1.5–2.5)

## 2013-11-15 NOTE — Patient Instructions (Signed)
Diet and Irritable Bowel Syndrome  No cure has been found for irritable bowel syndrome (IBS). Many options are available to treat the symptoms. Your caregiver will give you the best treatments available for your symptoms. He or she will also encourage you to manage stress and to make changes to your diet. You need to work with your caregiver and Registered Dietician to find the best combination of medicine, diet, counseling, and support to control your symptoms. The following are some diet suggestions. FOODS THAT MAKE IBS WORSE  Fatty foods, such as [place] fries.  Milk products, such as cheese or ice cream.  Chocolate.  Alcohol.  Caffeine (found in coffee and some sodas).  Carbonated drinks, such as soda. If certain foods cause symptoms, you should eat less of them or stop eating them. FOOD JOURNAL   Keep a journal of the foods that seem to cause distress. Write down:  What you are eating during the day and when.  What problems you are having after eating.  When the symptoms occur in relation to your meals.  What foods always make you feel badly.  Take your notes with you to your caregiver to see if you should stop eating certain foods. FOODS THAT MAKE IBS BETTER Fiber reduces IBS symptoms, especially constipation, because it makes stools soft, bulky, and easier to pass. Fiber is found in bran, bread, cereal, beans, fruit, and vegetables. Examples of foods with fiber include:  Apples.  Peaches.  Pears.  Berries.  Figs.  Broccoli, raw.  Cabbage.  Carrots.  Raw peas.  Kidney beans.  Lima beans.  Whole-grain bread.  Whole-grain cereal. Add foods with fiber to your diet a little at a time. This will let your body get used to them. Too much fiber at once might cause gas and swelling of your abdomen. This can trigger symptoms in a person with IBS. Caregivers usually recommend a diet with enough fiber to produce soft, painless bowel movements. High fiber diets may  cause gas and bloating. However, these symptoms often go away within a few weeks, as your body adjusts. In many cases, dietary fiber may lessen IBS symptoms, particularly constipation. However, it may not help pain or diarrhea. High fiber diets keep the colon mildly enlarged (distended) with the added fiber. This may help prevent spasms in the colon. Some forms of fiber also keep water in the stool, thereby preventing hard stools that are difficult to pass.  Besides telling you to eat more foods with fiber, your caregiver may also tell you to get more fiber by taking a fiber pill or drinking water mixed with a special high fiber powder. An example of this is a natural fiber laxative containing psyllium seed.  TIPS  Large meals can cause cramping and diarrhea in people with IBS. If this happens to you, try eating 4 or 5 small meals a day, or try eating less at each of your usual 3 meals. It may also help if your meals are low in fat and high in carbohydrates. Examples of carbohydrates are pasta, rice, whole-grain breads and cereals, fruits, and vegetables.  If dairy products cause your symptoms to flare up, you can try eating less of those foods. You might be able to handle yogurt better than other dairy products, because it contains bacteria that helps with digestion. Dairy products are an important source of calcium and other nutrients. If you need to avoid dairy products, be sure to talk with a Registered Dietitian about getting these nutrients  through other food sources.  Drink enough water and fluids to keep your urine clear or pale yellow. This is important, especially if you have diarrhea. FOR MORE INFORMATION  International Foundation for Functional Gastrointestinal Disorders: www.iffgd.org  National Digestive Diseases Information Clearinghouse: digestive.AmenCredit.is Document Released: 01/09/2004 Document Revised: 01/11/2012 Document Reviewed: 09/26/2007 [facility] Patient Information 2014  [place], [place].  Gastroesophageal Reflux Disease, Adult Gastroesophageal reflux disease (GERD) happens when acid from your stomach flows up into the esophagus. When acid comes in contact with the esophagus, the acid causes soreness (inflammation) in the esophagus. Over time, GERD may create small holes (ulcers) in the lining of the esophagus. CAUSES   Increased body weight. This puts pressure on the stomach, making acid rise from the stomach into the esophagus.  Smoking. This increases acid production in the stomach.  Drinking alcohol. This causes decreased pressure in the lower esophageal sphincter (valve or ring of muscle between the esophagus and stomach), allowing acid from the stomach into the esophagus.  Late evening meals and a full stomach. This increases pressure and acid production in the stomach.  A malformed lower esophageal sphincter. Sometimes, no cause is found. SYMPTOMS   Burning pain in the lower part of the mid-chest behind the breastbone and in the mid-stomach area. This may occur twice a week or more often.  Trouble swallowing.  Sore throat.  Dry cough.  Asthma-like symptoms including chest tightness, shortness of breath, or wheezing. DIAGNOSIS  Your caregiver may be able to diagnose GERD based on your symptoms. In some cases, X-rays and other tests may be done to check for complications or to check the condition of your stomach and esophagus. TREATMENT  Your caregiver may recommend over-the-counter or prescription medicines to help decrease acid production. Ask your caregiver before starting or adding any new medicines.  HOME CARE INSTRUCTIONS   Change the factors that you can control. Ask your caregiver for guidance concerning weight loss, quitting smoking, and alcohol consumption.  Avoid foods and drinks that make your symptoms worse, such as:  Caffeine or alcoholic drinks.  Chocolate.  Peppermint or mint flavorings.  Garlic and onions.  Spicy  foods.  Citrus fruits, such as oranges, lemons, or limes.  Tomato-based foods such as sauce, chili, salsa, and pizza.  Fried and fatty foods.  Avoid lying down for the 3 hours prior to your bedtime or prior to taking a nap.  Eat small, frequent meals instead of large meals.  Wear loose-fitting clothing. Do not wear anything tight around your waist that causes pressure on your stomach.  Raise the head of your bed 6 to 8 inches with wood blocks to help you sleep. Extra pillows will not help.  Only take over-the-counter or prescription medicines for pain, discomfort, or fever as directed by your caregiver.  Do not take aspirin, ibuprofen, or other nonsteroidal anti-inflammatory drugs (NSAIDs). SEEK IMMEDIATE MEDICAL CARE IF:   You have pain in your arms, neck, jaw, teeth, or back.  Your pain increases or changes in intensity or duration.  You develop nausea, vomiting, or sweating (diaphoresis).  You develop shortness of breath, or you faint.  Your vomit is green, yellow, black, or looks like coffee grounds or blood.  Your stool is red, bloody, or black. These symptoms could be signs of other problems, such as heart disease, gastric bleeding, or esophageal bleeding. MAKE SURE YOU:   Understand these instructions.  Will watch your condition.  Will get help right away if you are not doing well or get  worse. Document Released: 07/29/2005 Document Revised: 01/11/2012 Document Reviewed: 05/08/2011 [facility] Patient Information 2014 [place], [place].  Hypertension As your heart beats, it forces blood through your arteries. This force is your blood pressure. If the pressure is too high, it is called hypertension (HTN) or high blood pressure. HTN is dangerous because you may have it and not know it. High blood pressure may mean that your heart has to work harder to pump blood. Your arteries may be narrow or stiff. The extra work puts you at risk for heart disease, stroke, and other  problems.  Blood pressure consists of two numbers, a higher number over a lower, 110/72, for example. It is stated as "110 over 72." The ideal is below 120 for the top number (systolic) and under 80 for the bottom (diastolic). Write down your blood pressure today. You should pay close attention to your blood pressure if you have certain conditions such as:  Heart failure.  Prior heart attack.  Diabetes  Chronic kidney disease.  Prior stroke.  Multiple risk factors for heart disease. To see if you have HTN, your blood pressure should be measured while you are seated with your arm held at the level of the heart. It should be measured at least twice. A one-time elevated blood pressure reading (especially in the Emergency Department) does not mean that you need treatment. There may be conditions in which the blood pressure is different between your right and left arms. It is important to see your caregiver soon for a recheck. Most people have essential hypertension which means that there is not a specific cause. This type of high blood pressure may be lowered by changing lifestyle factors such as:  Stress.  Smoking.  Lack of exercise.  Excessive weight.  Drug/tobacco/alcohol use.  Eating less salt. Most people do not have symptoms from high blood pressure until it has caused damage to the body. Effective treatment can often prevent, delay or reduce that damage. TREATMENT  When a cause has been identified, treatment for high blood pressure is directed at the cause. There are a large number of medications to treat HTN. These fall into several categories, and your caregiver will help you select the medicines that are best for you. Medications may have side effects. You should review side effects with your caregiver. If your blood pressure stays high after you have made lifestyle changes or started on medicines,   Your medication(s) may need to be changed.  Other problems may need to be  addressed.  Be certain you understand your prescriptions, and know how and when to take your medicine.  Be sure to follow up with your caregiver within the time frame advised (usually within two weeks) to have your blood pressure rechecked and to review your medications.  If you are taking more than one medicine to lower your blood pressure, make sure you know how and at what times they should be taken. Taking two medicines at the same time can result in blood pressure that is too low. SEEK IMMEDIATE MEDICAL CARE IF:  You develop a severe headache, blurred or changing vision, or confusion.  You have unusual weakness or numbness, or a faint feeling.  You have severe chest or abdominal pain, vomiting, or breathing problems. MAKE SURE YOU:   Understand these instructions.  Will watch your condition.  Will get help right away if you are not doing well or get worse. Document Released: 10/19/2005 Document Revised: 01/11/2012 Document Reviewed: 06/08/2008 [employer] Patient Information 2014  [employer].  Diabetes and Exercise Exercising regularly is important. It is not just about losing weight. It has many health benefits, such as:  Improving your overall fitness, flexibility, and endurance.  Increasing your bone density.  Helping with weight control.  Decreasing your body fat.  Increasing your muscle strength.  Reducing stress and tension.  Improving your overall health. People with diabetes who exercise gain additional benefits because exercise:  Reduces appetite.  Improves the body's use of blood sugar (glucose).  Helps lower or control blood glucose.  Decreases blood pressure.  Helps control blood lipids (such as cholesterol and triglycerides).  Improves the body's use of the hormone insulin by:  Increasing the body's insulin sensitivity.  Reducing the body's insulin needs.  Decreases the risk for heart disease because exercising:  Lowers cholesterol and  triglycerides levels.  Increases the levels of good cholesterol (such as high-density lipoproteins [HDL]) in the body.  Lowers blood glucose levels. YOUR ACTIVITY PLAN  Choose an activity that you enjoy and set realistic goals. Your health care provider or diabetes educator can help you make an activity plan that works for you. You can break activities into 2 or 3 sessions throughout the day. Doing so is as good as one long session. Exercise ideas include:  Taking the dog for a walk.  Taking the stairs instead of the elevator.  Dancing to your favorite song.  Doing your favorite exercise with a friend. RECOMMENDATIONS FOR EXERCISING WITH TYPE 1 OR TYPE 2 DIABETES   Check your blood glucose before exercising. If blood glucose levels are greater than 240 mg/dL, check for urine ketones. Do not exercise if ketones are present.  Avoid injecting insulin into areas of the body that are going to be exercised. For example, avoid injecting insulin into:  The arms when playing tennis.  The legs when jogging.  Keep a record of:  Food intake before and after you exercise.  Expected peak times of insulin action.  Blood glucose levels before and after you exercise.  The type and amount of exercise you have done.  Review your records with your health care provider. Your health care provider will help you to develop guidelines for adjusting food intake and insulin amounts before and after exercising.  If you take insulin or oral hypoglycemic agents, watch for signs and symptoms of hypoglycemia. They include:  Dizziness.  Shaking.  Sweating.  Chills.  Confusion.  Drink plenty of water while you exercise to prevent dehydration or heat stroke. Body water is lost during exercise and must be replaced.  Talk to your health care provider before starting an exercise program to make sure it is safe for you. Remember, almost any type of activity is better than none. Document Released:  01/09/2004 Document Revised: 06/21/2013 Document Reviewed: 03/28/2013 [facility] Patient Information 2014 [place].  Cholesterol Cholesterol is a white, waxy, fat-like protein needed by your body in small amounts. The liver makes all the cholesterol you need. It is carried from the liver by the blood through the blood vessels. Deposits (plaque) may build up on blood vessel walls. This makes the arteries narrower and stiffer. Plaque increases the risk for heart attack and stroke. You cannot feel your cholesterol level even if it is very high. The only way to know is by a blood test to check your lipid (fats) levels. Once you know your cholesterol levels, you should keep a record of the test results. Work with your caregiver to to keep your levels in  the desired range. WHAT THE RESULTS MEAN:  Total cholesterol is a rough measure of all the cholesterol in your blood.  LDL is the so-called bad cholesterol. This is the type that deposits cholesterol in the walls of the arteries. You want this level to be low.  HDL is the good cholesterol because it cleans the arteries and carries the LDL away. You want this level to be high.  Triglycerides are fat that the body can either burn for energy or store. High levels are closely linked to heart disease. DESIRED LEVELS:  Total cholesterol below 200.  LDL below 100 for people at risk, below 70 for very high risk.  HDL above 50 is good, above 60 is best.  Triglycerides below 150. HOW TO LOWER YOUR CHOLESTEROL:  Diet.  Choose fish or white meat chicken and [place], roasted or baked. Limit fatty cuts of red meat, fried foods, and processed meats, such as sausage and lunch meat.  Eat lots of fresh fruits and vegetables. Choose whole grains, beans, pasta, potatoes and cereals.  Use only small amounts of olive, corn or canola oils. Avoid butter, mayonnaise, shortening or palm kernel oils. Avoid foods with trans-fats.  Use skim/nonfat milk and  low-fat/nonfat yogurt and cheeses. Avoid whole milk, cream, ice cream, egg yolks and cheeses. Healthy desserts include angel food cake, ginger snaps, animal crackers, hard candy, popsicles, and low-fat/nonfat frozen yogurt. Avoid pastries, cakes, pies and cookies.  Exercise.  A regular program helps decrease LDL and raises HDL.  Helps with weight control.  Do things that increase your activity level like gardening, walking, or taking the stairs.  Medication.  May be prescribed by your caregiver to help lowering cholesterol and the risk for heart disease.  You may need medicine even if your levels are normal if you have several risk factors. HOME CARE INSTRUCTIONS   Follow your diet and exercise programs as suggested by your caregiver.  Take medications as directed.  Have blood work done when your caregiver feels it is necessary. MAKE SURE YOU:   Understand these instructions.  Will watch your condition.  Will get help right away if you are not doing well or get worse. Document Released: 07/14/2001 Document Revised: 01/11/2012 Document Reviewed: 08/02/2013 [facility] Patient Information 2014 [place], [place]. Vitamin D Deficiency Vitamin D is an important vitamin that your body needs. Having too little of it in your body is called a deficiency. A very bad deficiency can make your bones soft and can cause a condition called rickets.  Vitamin D is important to your body for different reasons, such as:   It helps your body absorb 2 minerals called calcium and phosphorus.  It helps make your bones healthy.  It may prevent some diseases, such as diabetes and multiple sclerosis.  It helps your muscles and heart. You can get vitamin D in several ways. It is a natural part of some foods. The vitamin is also added to some dairy products and cereals. Some people take vitamin D supplements. Also, your body makes vitamin D when you are in the sun. It changes the sun's rays into a form of  the vitamin that your body can use. CAUSES   Not eating enough foods that contain vitamin D.  Not getting enough sunlight.  Having certain digestive system diseases that make it hard to absorb vitamin D. These diseases include Crohn's disease, chronic pancreatitis, and cystic fibrosis.  Having a surgery in which part of the stomach or small intestine is removed.  Being obese. Fat cells pull vitamin D out of your blood. That means that obese people may not have enough vitamin D left in their blood and in other body tissues.  Having chronic kidney or liver disease. RISK FACTORS Risk factors are things that make you more likely to develop a vitamin D deficiency. They include:  Being older.  Not being able to get outside very much.  Living in a nursing home.  Having had broken bones.  Having weak or thin bones (osteoporosis).  Having a disease or condition that changes how your body absorbs vitamin D.  Having dark skin.  Some medicines such as seizure medicines or steroids.  Being overweight or obese. SYMPTOMS Mild cases of vitamin D deficiency may not have any symptoms. If you have a very bad case, symptoms may include:  Bone pain.  Muscle pain.  Falling often.  Broken bones caused by a minor injury, due to osteoporosis. DIAGNOSIS A blood test is the best way to tell if you have a vitamin D deficiency. TREATMENT Vitamin D deficiency can be treated in different ways. Treatment for vitamin D deficiency depends on what is causing it. Options include:  Taking vitamin D supplements.  Taking a calcium supplement. Your caregiver will suggest what dose is best for you. HOME CARE INSTRUCTIONS  Take any supplements that your caregiver prescribes. Follow the directions carefully. Take only the suggested amount.  Have your blood tested 2 months after you start taking supplements.  Eat foods that contain vitamin D. Healthy choices include:  Fortified dairy products,  cereals, or juices. Fortified means vitamin D has been added to the food. Check the label on the package to be sure.  Fatty fish like salmon or trout.  Eggs.  Oysters.  Do not use a tanning bed.  Keep your weight at a healthy level. Lose weight if you need to.  Keep all follow-up appointments. Your caregiver will need to perform blood tests to make sure your vitamin D deficiency is going away. SEEK MEDICAL CARE IF:  You have any questions about your treatment.  You continue to have symptoms of vitamin D deficiency.  You have nausea or vomiting.  You are constipated.  You feel confused.  You have severe abdominal or back pain. MAKE SURE YOU:  Understand these instructions.  Will watch your condition.  Will get help right away if you are not doing well or get worse. Document Released: 01/11/2012 Document Revised: 02/13/2013 Document Reviewed: 01/11/2012 [facility] Patient Information 2014 [place].

## 2013-11-15 NOTE — Progress Notes (Signed)
Patient ID: Sarah Hartman, female   DOB: December 25, 1950, 63 y.o.   MRN: [id number]  Annual Screening Comprehensive Examination  This very nice 63 y.o. DWF presents for complete physical.  Patient has been followed for HTN, Hypothyroidism,  Prediabetes, Hyperlipidemia, and Vitamin D Deficiency.    She has Hx/o Labile HTN predates since 2007 being monitored expectantly. She did have one reading of 132/90 in Jan 2014. Patient's BP has been controlled at home. Today's BP: 132/76 mmHg. Patient denies any cardiac symptoms as chest pain, palpitations, shortness of breath, dizziness or ankle swelling.   Patient's hyperlipidemia is controlled with diet and medications. Patient denies myalgias or other medication SE's. Last cholesterol last visit was 203, triglycerides 81, HDL 85 and LDL 102 in Jan 2014.     Patient has prediabetes predating since Jan 2012 with A1c 5.7% and last A1c 5.6%. Patient denies reactive hypoglycemic symptoms, visual blurring, diabetic polys, or paresthesias.    Patient has Hypothyroidism controlled on current therapy. She also has GERD and IBS which have been assymptomatic over the last year.   Finally, patient has history of Vitamin D Deficiency with last vitamin D 96 in Jan 2014.       Medication List         beta carotene w/minerals tablet  Take 1 tablet by mouth daily.     CALCIUM 600 + D PO  Take 600 mg by mouth daily.     levothyroxine 50 MCG tablet  Commonly known as:  SYNTHROID, LEVOTHROID  Take 1 tablet by mouth Daily.     ranitidine 300 MG capsule  Commonly known as:  ZANTAC  Take 300 mg by mouth as needed. heartburn     RED YEAST RICE PO  Take 1,200 mg by mouth daily.     Vitamin D 1000 UNITS capsule  Take 1,000 Units by mouth 2 (two) times daily.        Allergies  Allergen Reactions  . Codeine Nausea And Vomiting and Other (See Comments)    Migraine    Past Medical History  Diagnosis Date  . Macular degeneration of left eye   .  Adenomatous colon polyp   . GERD (gastroesophageal reflux disease)   . Thyroid disease     hypothyroid  . IBS (irritable bowel syndrome)   . Vitamin D deficiency   . RLS (restless legs syndrome)   . Labile hypertension     Past Surgical History  Procedure Laterality Date  . Colonoscopy    . Tubal ligation    . Tendon reconstruction Right 04/27/2013    Procedure: RIGHT ELBOW DEBRIDE/REPAIR/EXPLORE - LATERAL HUMERAL EPICONDYLE;  Surgeon: Ninetta Lights, MD;  Location: [facility];  Service: Orthopedics;  Laterality: Right;    Family History  Problem Relation Age of Onset  . Alzheimer's disease Mother   . Hypertension Father   . Cerebral aneurysm Father     History  Substance Use Topics  . Smoking status: Former Smoker    Quit date: 04/24/1981  . Smokeless tobacco: Never Used  . Alcohol Use: 1.8 oz/week    3 Glasses of wine per week     Comment: occ    ROS Constitutional: Denies fever, chills, weight loss/gain, headaches, insomnia, fatigue, night sweats, and change in appetite. Eyes: Denies redness, blurred vision, diplopia, discharge, itchy, watery eyes.  ENT: Denies discharge, congestion, post nasal drip, epistaxis, sore throat, earache, hearing loss, dental pain, Tinnitus, Vertigo, Sinus pain, snoring.  Cardio: Denies chest pain,  palpitations, irregular heartbeat, syncope, dyspnea, diaphoresis, orthopnea, PND, claudication, edema Respiratory: denies cough, dyspnea, DOE, pleurisy, hoarseness, laryngitis, wheezing.  Gastrointestinal: Denies dysphagia, heartburn, reflux, water brash, pain, cramps, nausea, vomiting, bloating, diarrhea, constipation, hematemesis, melena, hematochezia, jaundice, hemorrhoids Genitourinary: Denies dysuria, frequency, urgency, nocturia, hesitancy, discharge, hematuria, flank pain Breast:Breast lumps, nipple discharge, bleeding.  Musculoskeletal: Denies arthralgia, myalgia, stiffness, Jt. Swelling, pain, limp, and strain/sprain. Skin:  Denies puritis, rash, hives, warts, acne, eczema, changing in skin lesion Neuro: No weakness, tremor, incoordination, spasms, paresthesia, pain Psychiatric: Denies confusion, memory loss, sensory loss Endocrine: Denies change in weight, skin, hair change, nocturia, and paresthesia, diabetic polys, visual blurring, hyper / hypo glycemic episodes.  Heme/Lymph: No excessive bleeding, bruising, enlarged lymph nodes.  BP: 132/76  Pulse: 64  Temp: 97.5 F (36.4 C)  Resp: 18    body mass index  23.81 kg/(m^2)    Height  5' 2.75"    Weight  133 lb 6.4 oz   Physical Exam  General Appearance: Well nourished, in no apparent distress. Eyes: PERRLA, EOMs, conjunctiva no swelling or erythema, normal fundi and vessels. Sinuses: No frontal/maxillary tenderness ENT/Mouth: EACs patent / TMs  nl. Nares clear without erythema, swelling, mucoid exudates. Oral hygiene is good. No erythema, swelling, or exudate. Tongue normal, non-obstructing. Tonsils not swollen or erythematous. Hearing normal.  Neck: Supple, thyroid normal. No bruits, nodes or JVD. Respiratory: Respiratory effort normal.  BS equal and clear bilateral without rales, rhonci, wheezing or stridor. Cardio: Heart sounds are normal with regular rate and rhythm and no murmurs, rubs or gallops. Peripheral pulses are normal and equal bilaterally without edema. No aortic or femoral bruits. Chest: symmetric with normal excursions and percussion. Breasts: Symmetric, without lumps, nipple discharge, retractions, or fibrocystic changes.  Abdomen: Flat, soft, with bowl sounds. Nontender, no guarding, rebound, hernias, masses, or organomegaly.  Lymphatics: Non tender without lymphadenopathy.  Genitourinary:  Musculoskeletal: Full ROM all peripheral extremities, joint stability, 5/5 strength, and normal gait. Skin: Warm and dry without rashes, lesions, cyanosis, clubbing or  ecchymosis.  Neuro: Cranial nerves intact, reflexes equal bilaterally. Normal  muscle tone, no cerebellar symptoms. Sensation intact.  Pysch: Awake and oriented X 3, normal affect, Insight and Judgment appropriate.   Assessment and Plan  1. Annual Screening Examination 2. Hypertension, labile 3. Hyperlipidemia, diet controlled 4. Pre Diabetes, diet controlled 5. Vitamin D Deficiency  Continue prudent diet as discussed, weight control, BP monitoring, regular exercise, and medications. Discussed med's effects and SE's. Screening labs and tests as requested with regular follow-up as recommended.

## 2013-11-16 LAB — HEPATITIS B SURFACE ANTIBODY,QUALITATIVE: HEP B S AB: NEGATIVE

## 2013-11-16 LAB — URINALYSIS, MICROSCOPIC ONLY
Bacteria, UA: NONE SEEN
CASTS: NONE SEEN
CRYSTALS: NONE SEEN
Squamous Epithelial / LPF: NONE SEEN

## 2013-11-16 LAB — HEPATITIS C ANTIBODY: HCV Ab: NEGATIVE

## 2013-11-16 LAB — RPR

## 2013-11-16 LAB — MICROALBUMIN / CREATININE URINE RATIO
Creatinine, Urine: 81.7 mg/dL
MICROALB UR: 0.5 mg/dL (ref 0.00–1.89)
MICROALB/CREAT RATIO: 6.1 mg/g (ref 0.0–30.0)

## 2013-11-16 LAB — TSH: TSH: 1.546 u[IU]/mL (ref 0.350–4.500)

## 2013-11-16 LAB — HEPATITIS B E ANTIBODY: Hepatitis Be Antibody: NEGATIVE

## 2013-11-16 LAB — HEPATITIS B CORE ANTIBODY, TOTAL: Hep B Core Total Ab: NONREACTIVE

## 2013-11-16 LAB — VITAMIN B12: VITAMIN B 12: 558 pg/mL (ref 211–911)

## 2013-11-16 LAB — HIV ANTIBODY (ROUTINE TESTING W REFLEX): HIV: NONREACTIVE

## 2013-11-16 LAB — INSULIN, FASTING: INSULIN FASTING, SERUM: 4 u[IU]/mL (ref 3–28)

## 2013-11-16 LAB — VITAMIN D 25 HYDROXY (VIT D DEFICIENCY, FRACTURES): VIT D 25 HYDROXY: 84 ng/mL (ref 30–89)

## 2013-11-16 LAB — HEPATITIS A ANTIBODY, TOTAL: HEP A TOTAL AB: BORDERLINE — AB

## 2013-11-17 ENCOUNTER — Telehealth: Payer: Self-pay | Admitting: *Deleted

## 2013-11-17 NOTE — Telephone Encounter (Signed)
Message copied by Emelda Brothers on Fri Nov 17, 2013  8:30 AM ------      Message from: Unk Pinto      Created: Fri Nov 17, 2013 12:14 AM       Hep A borderline positive - suggests prior infection with Hep A ---- Both Hep B & C  Are negative -       HIV/AIDS and RPR/Syphilis are neg/ok      A1c nl - no diabetes ---- vit 84  Great --chol 202 hdl 76 terrific - very low risk for ht attack      All else nl and ok       ------

## 2013-11-20 ENCOUNTER — Telehealth: Payer: Self-pay | Admitting: *Deleted

## 2013-11-20 NOTE — Telephone Encounter (Signed)
Message copied by Emelda Brothers on Mon Nov 20, 2013 12:23 PM ------      Message from: Unk Pinto      Created: Fri Nov 17, 2013 12:14 AM       Hep A borderline positive - suggests prior infection with Hep A ---- Both Hep B & C  Are negative -       HIV/AIDS and RPR/Syphilis are neg/ok      A1c nl - no diabetes ---- vit 84  Great --chol 202 hdl 76 terrific - very low risk for ht attack      All else nl and ok       ------

## 2013-12-06 ENCOUNTER — Other Ambulatory Visit: Payer: Self-pay | Admitting: Internal Medicine

## 2014-03-22 ENCOUNTER — Telehealth: Payer: Self-pay | Admitting: Internal Medicine

## 2014-03-22 ENCOUNTER — Other Ambulatory Visit: Payer: Self-pay | Admitting: Internal Medicine

## 2014-03-22 MED ORDER — PANTOPRAZOLE SODIUM 40 MG PO TBEC: 40.0000 mg | DELAYED_RELEASE_TABLET | Freq: Every day | ORAL | Status: DC

## 2014-03-22 NOTE — Telephone Encounter (Signed)
Ph - [phone] Patient said Zantac (g) 300mg  tab, not working,keeping patient up at night, please advise patient.  Pharm- [facility] in [place]  Thank you, [facility] [phone]

## 2014-04-12 ENCOUNTER — Other Ambulatory Visit: Payer: Self-pay | Admitting: *Deleted

## 2014-04-12 MED ORDER — LEVOTHYROXINE SODIUM 50 MCG PO TABS: 50.0000 ug | ORAL_TABLET | Freq: Every day | ORAL | Status: DC

## 2014-08-27 ENCOUNTER — Other Ambulatory Visit: Payer: Self-pay | Admitting: *Deleted

## 2014-08-27 MED ORDER — PANTOPRAZOLE SODIUM 40 MG PO TBEC: 40.0000 mg | DELAYED_RELEASE_TABLET | Freq: Every day | ORAL | Status: DC

## 2014-08-27 MED ORDER — LEVOTHYROXINE SODIUM 50 MCG PO TABS: 50.0000 ug | ORAL_TABLET | Freq: Every day | ORAL | Status: DC

## 2014-10-15 ENCOUNTER — Other Ambulatory Visit: Payer: Self-pay

## 2014-10-15 MED ORDER — LEVOTHYROXINE SODIUM 50 MCG PO TABS
50.0000 ug | ORAL_TABLET | Freq: Every day | ORAL | Status: DC
Start: 2014-10-15 — End: 2016-04-28

## 2014-11-15 ENCOUNTER — Encounter: Payer: Self-pay | Admitting: Internal Medicine

## 2015-02-07 ENCOUNTER — Ambulatory Visit (INDEPENDENT_AMBULATORY_CARE_PROVIDER_SITE_OTHER): Payer: PPO | Admitting: Internal Medicine

## 2015-02-07 ENCOUNTER — Encounter: Payer: Self-pay | Admitting: Internal Medicine

## 2015-02-07 VITALS — BP 128/88 | HR 68 | Temp 97.7°F | Resp 16 | Ht 63.0 in | Wt 137.0 lb

## 2015-02-07 DIAGNOSIS — I10 Essential (primary) hypertension: Secondary | ICD-10-CM

## 2015-02-07 DIAGNOSIS — Z136 Encounter for screening for cardiovascular disorders: Secondary | ICD-10-CM

## 2015-02-07 DIAGNOSIS — E039 Hypothyroidism, unspecified: Secondary | ICD-10-CM

## 2015-02-07 DIAGNOSIS — N3289 Other specified disorders of bladder: Secondary | ICD-10-CM

## 2015-02-07 DIAGNOSIS — Z0001 Encounter for general adult medical examination with abnormal findings: Secondary | ICD-10-CM

## 2015-02-07 DIAGNOSIS — Z0000 Encounter for general adult medical examination without abnormal findings: Secondary | ICD-10-CM

## 2015-02-07 DIAGNOSIS — Z79899 Other long term (current) drug therapy: Secondary | ICD-10-CM | POA: Insufficient documentation

## 2015-02-07 DIAGNOSIS — R7309 Other abnormal glucose: Secondary | ICD-10-CM | POA: Insufficient documentation

## 2015-02-07 DIAGNOSIS — Z1331 Encounter for screening for depression: Secondary | ICD-10-CM

## 2015-02-07 DIAGNOSIS — E782 Mixed hyperlipidemia: Secondary | ICD-10-CM | POA: Insufficient documentation

## 2015-02-07 DIAGNOSIS — R6889 Other general symptoms and signs: Secondary | ICD-10-CM

## 2015-02-07 DIAGNOSIS — R0989 Other specified symptoms and signs involving the circulatory and respiratory systems: Secondary | ICD-10-CM

## 2015-02-07 DIAGNOSIS — Z9181 History of falling: Secondary | ICD-10-CM

## 2015-02-07 DIAGNOSIS — R7303 Prediabetes: Secondary | ICD-10-CM

## 2015-02-07 DIAGNOSIS — E559 Vitamin D deficiency, unspecified: Secondary | ICD-10-CM

## 2015-02-07 DIAGNOSIS — Z1212 Encounter for screening for malignant neoplasm of rectum: Secondary | ICD-10-CM

## 2015-02-07 LAB — CBC WITH DIFFERENTIAL/PLATELET
BASOS ABS: 0 10*3/uL (ref 0.0–0.1)
Basophils Relative: 0 % (ref 0–1)
EOS PCT: 0 % (ref 0–5)
Eosinophils Absolute: 0 10*3/uL (ref 0.0–0.7)
HCT: 39 % (ref 36.0–46.0)
HEMOGLOBIN: 13.6 g/dL (ref 12.0–15.0)
LYMPHS ABS: 2.1 10*3/uL (ref 0.7–4.0)
Lymphocytes Relative: 41 % (ref 12–46)
MCH: 30.4 pg (ref 26.0–34.0)
MCHC: 34.9 g/dL (ref 30.0–36.0)
MCV: 87.1 fL (ref 78.0–100.0)
MONOS PCT: 3 % (ref 3–12)
MPV: 9 fL (ref 8.6–12.4)
Monocytes Absolute: 0.2 10*3/uL (ref 0.1–1.0)
Neutro Abs: 2.8 10*3/uL (ref 1.7–7.7)
Neutrophils Relative %: 56 % (ref 43–77)
Platelets: 292 10*3/uL (ref 150–400)
RBC: 4.48 MIL/uL (ref 3.87–5.11)
RDW: 13.2 % (ref 11.5–15.5)
WBC: 5 10*3/uL (ref 4.0–10.5)

## 2015-02-07 LAB — HEMOGLOBIN A1C
HEMOGLOBIN A1C: 5.6 % (ref <5.7)
Mean Plasma Glucose: 114 mg/dL (ref <117)

## 2015-02-07 LAB — BASIC METABOLIC PANEL WITH GFR
BUN: 15 mg/dL (ref 6–23)
CHLORIDE: 103 meq/L (ref 96–112)
CO2: 27 meq/L (ref 19–32)
CREATININE: 0.85 mg/dL (ref 0.50–1.10)
Calcium: 9.9 mg/dL (ref 8.4–10.5)
GFR, Est African American: 83 mL/min
GFR, Est Non African American: 72 mL/min
GLUCOSE: 111 mg/dL — AB (ref 70–99)
Potassium: 3.8 mEq/L (ref 3.5–5.3)
Sodium: 140 mEq/L (ref 135–145)

## 2015-02-07 LAB — TSH: TSH: 3.087 u[IU]/mL (ref 0.350–4.500)

## 2015-02-07 LAB — LIPID PANEL
Cholesterol: 189 mg/dL (ref 0–200)
HDL: 61 mg/dL (ref >=46)
LDL Cholesterol: 114 mg/dL — ABNORMAL HIGH (ref 0–99)
Total CHOL/HDL Ratio: 3.1 Ratio
Triglycerides: 72 mg/dL (ref <150)
VLDL: 14 mg/dL (ref 0–40)

## 2015-02-07 LAB — MAGNESIUM: Magnesium: 1.8 mg/dL (ref 1.5–2.5)

## 2015-02-07 MED ORDER — HYOSCYAMINE SULFATE 0.125 MG SL SUBL: SUBLINGUAL_TABLET | SUBLINGUAL | Status: DC

## 2015-02-07 NOTE — Patient Instructions (Signed)
Recommend the book "The END of DIETING" by Dr Excell Seltzer   & the book "The END of DIABETES " by Dr Excell Seltzer  At [email or web address] - get book & Audio CD's      Being diabetic has a  300% increased risk for heart attack, stroke, cancer, and alzheimer- type vascular dementia. It is very important that you work harder with diet by avoiding all foods that are white. Avoid white rice (brown & wild rice is OK), white potatoes (sweetpotatoes in moderation is OK), White bread or wheat bread or anything made out of white flour like bagels, donuts, rolls, buns, biscuits, cakes, pastries, cookies, pizza crust, and pasta (made from white flour & egg whites) - vegetarian pasta or spinach or wheat pasta is OK. Multigrain breads like Arnold's or [employer], or multigrain sandwich thins or flatbreads.  Diet, exercise and weight loss can reverse and cure diabetes in the early stages.  Diet, exercise and weight loss is very important in the control and prevention of complications of diabetes which affects every system in your body, ie. Brain - dementia/stroke, eyes - glaucoma/blindness, heart - heart attack/heart failure, kidneys - dialysis, stomach - gastric paralysis, intestines - malabsorption, nerves - severe painful neuritis, circulation - gangrene & loss of a leg(s), and finally cancer and Alzheimers.    I recommend avoid fried & greasy foods,  sweets/candy, white rice (brown or wild rice or Quinoa is OK), white potatoes (sweet potatoes are OK) - anything made from white flour - bagels, doughnuts, rolls, buns, biscuits,white and wheat breads, pizza crust and traditional pasta made of white flour & egg white(vegetarian pasta or spinach or wheat pasta is OK).  Multi-grain bread is OK - like multi-grain flat bread or sandwich thins. Avoid alcohol in excess. Exercise is also important.    Eat all the vegetables you want - avoid meat, especially red meat and dairy - especially cheese.  Cheese is the most  concentrated form of trans-fats which is the worst thing to clog up our arteries. Veggie cheese is OK which can be found in the fresh produce section at [employer] or [employer] or [employer]  Preventive Care for Adults A healthy lifestyle and preventive care can promote health and wellness. Preventive health guidelines for women include the following key practices.  A routine yearly physical is a good way to check with your health care provider about your health and preventive screening. It is a chance to share any concerns and updates on your health and to receive a thorough exam.  Visit your dentist for a routine exam and preventive care every 6 months. Brush your teeth twice a day and floss once a day. Good oral hygiene prevents tooth decay and gum disease.  The frequency of eye exams is based on your age, health, family medical history, use of contact lenses, and other factors. Follow your health care provider's recommendations for frequency of eye exams.  Eat a healthy diet. Foods like vegetables, fruits, whole grains, low-fat dairy products, and lean protein foods contain the nutrients you need without too many calories. Decrease your intake of foods high in solid fats, added sugars, and salt. Eat the right amount of calories for you.Get information about a proper diet from your health care provider, if necessary.  Regular physical exercise is one of the most important things you can do for your health. Most adults should get at least 150 minutes of moderate-intensity exercise (any activity that increases your heart rate and  causes you to sweat) each week. In addition, most adults need muscle-strengthening exercises on 2 or more days a week.  Maintain a healthy weight. The body mass index (BMI) is a screening tool to identify possible weight problems. It provides an estimate of body fat based on height and weight. Your health care provider can find your BMI and can help you achieve or  maintain a healthy weight.For adults 20 years and older:  A BMI below 18.5 is considered underweight.  A BMI of 18.5 to 24.9 is normal.  A BMI of 25 to 29.9 is considered overweight.  A BMI of 30 and above is considered obese.  Maintain normal blood lipids and cholesterol levels by exercising and minimizing your intake of saturated fat. Eat a balanced diet with plenty of fruit and vegetables. If your lipid or cholesterol levels are high, you are over 50, or you are at high risk for heart disease, you may need your cholesterol levels checked more frequently.Ongoing high lipid and cholesterol levels should be treated with medicines if diet and exercise are not working.  If you smoke, find out from your health care provider how to quit. If you do not use tobacco, do not start.  Lung cancer screening is recommended for adults aged 55-80 years who are at high risk for developing lung cancer because of a history of smoking. A yearly low-dose CT scan of the lungs is recommended for people who have at least a 30-pack-year history of smoking and are a current smoker or have quit within the past 15 years. A pack year of smoking is smoking an average of 1 pack of cigarettes a day for 1 year (for example: 1 pack a day for 30 years or 2 packs a day for 15 years). Yearly screening should continue until the smoker has stopped smoking for at least 15 years. Yearly screening should be stopped for people who develop a health problem that would prevent them from having lung cancer treatment.  Avoid use of street drugs. Do not share needles with anyone. Ask for help if you need support or instructions about stopping the use of drugs.  High blood pressure causes heart disease and increases the risk of stroke.  Ongoing high blood pressure should be treated with medicines if weight loss and exercise do not work.  If you are 55-79 years old, ask your health care provider if you should take aspirin to prevent  strokes.  Diabetes screening involves taking a blood sample to check your fasting blood sugar level. This should be done once every 3 years, after age 45, if you are within normal weight and without risk factors for diabetes. Testing should be considered at a younger age or be carried out more frequently if you are overweight and have at least 1 risk factor for diabetes.  Breast cancer screening is essential preventive care for women. You should practice "breast self-awareness." This means understanding the normal appearance and feel of your breasts and may include breast self-examination. Any changes detected, no matter how small, should be reported to a health care provider. Women in their 20s and 30s should have a clinical breast exam (CBE) by a health care provider as part of a regular health exam every 1 to 3 years. After age 40, women should have a CBE every year. Starting at age 40, women should consider having a mammogram (breast X-ray test) every year. Women who have a family history of breast cancer should talk to their health   care provider about genetic screening. Women at a high risk of breast cancer should talk to their health care providers about having an MRI and a mammogram every year.  Breast cancer gene (BRCA)-related cancer risk assessment is recommended for women who have family members with BRCA-related cancers. BRCA-related cancers include breast, ovarian, tubal, and peritoneal cancers. Having family members with these cancers may be associated with an increased risk for harmful changes (mutations) in the breast cancer genes BRCA1 and BRCA2. Results of the assessment will determine the need for genetic counseling and BRCA1 and BRCA2 testing.  Routine pelvic exams to screen for cancer are no longer recommended for nonpregnant women who are considered low risk for cancer of the pelvic organs (ovaries, uterus, and vagina) and who do not have symptoms. Ask your health care provider if a  screening pelvic exam is right for you.  If you have had past treatment for cervical cancer or a condition that could lead to cancer, you need Pap tests and screening for cancer for at least 20 years after your treatment. If Pap tests have been discontinued, your risk factors (such as having a new sexual partner) need to be reassessed to determine if screening should be resumed. Some women have medical problems that increase the chance of getting cervical cancer. In these cases, your health care provider may recommend more frequent screening and Pap tests.    Colorectal cancer can be detected and often prevented. Most routine colorectal cancer screening begins at the age of 100 years and continues through age 42 years. However, your health care provider may recommend screening at an earlier age if you have risk factors for colon cancer. On a yearly basis, your health care provider may provide home test kits to check for hidden blood in the stool. Use of a small camera at the end of a tube, to directly examine the colon (sigmoidoscopy or colonoscopy), can detect the earliest forms of colorectal cancer. Talk to your health care provider about this at age 8, when routine screening begins. Direct exam of the colon should be repeated every 5-10 years through age 30 years, unless early forms of pre-cancerous polyps or small growths are found.  Osteoporosis is a disease in which the bones lose minerals and strength with aging. This can result in serious bone fractures or breaks. The risk of osteoporosis can be identified using a bone density scan. Women ages 53 years and over and women at risk for fractures or osteoporosis should discuss screening with their health care providers. Ask your health care provider whether you should take a calcium supplement or vitamin D to reduce the rate of osteoporosis.  Menopause can be associated with physical symptoms and risks. Hormone replacement therapy is available to  decrease symptoms and risks. You should talk to your health care provider about whether hormone replacement therapy is right for you.  Use sunscreen. Apply sunscreen liberally and repeatedly throughout the day. You should seek shade when your shadow is shorter than you. Protect yourself by wearing long sleeves, pants, a wide-brimmed hat, and sunglasses year round, whenever you are outdoors.  Once a month, do a whole body skin exam, using a mirror to look at the skin on your back. Tell your health care provider of new moles, moles that have irregular borders, moles that are larger than a pencil eraser, or moles that have changed in shape or color.  Stay current with required vaccines (immunizations).  Influenza vaccine. All adults should be immunized every  year.  Tetanus, diphtheria, and acellular pertussis (Td, Tdap) vaccine. Pregnant women should receive 1 dose of Tdap vaccine during each pregnancy. The dose should be obtained regardless of the length of time since the last dose. Immunization is preferred during the 27th-36th week of gestation. An adult who has not previously received Tdap or who does not know her vaccine status should receive 1 dose of Tdap. This initial dose should be followed by tetanus and diphtheria toxoids (Td) booster doses every 10 years. Adults with an unknown or incomplete history of completing a 3-dose immunization series with Td-containing vaccines should begin or complete a primary immunization series including a Tdap dose. Adults should receive a Td booster every 10 years.    Zoster vaccine. One dose is recommended for adults aged 56 years or older unless certain conditions are present.    Pneumococcal 13-valent conjugate (PCV13) vaccine. When indicated, a person who is uncertain of her immunization history and has no record of immunization should receive the PCV13 vaccine. An adult aged 32 years or older who has certain medical conditions and has not been previously  immunized should receive 1 dose of PCV13 vaccine. This PCV13 should be followed with a dose of pneumococcal polysaccharide (PPSV23) vaccine. The PPSV23 vaccine dose should be obtained at least 8 weeks after the dose of PCV13 vaccine. An adult aged 42 years or older who has certain medical conditions and previously received 1 or more doses of PPSV23 vaccine should receive 1 dose of PCV13. The PCV13 vaccine dose should be obtained 1 or more years after the last PPSV23 vaccine dose.    Pneumococcal polysaccharide (PPSV23) vaccine. When PCV13 is also indicated, PCV13 should be obtained first. All adults aged 48 years and older should be immunized. An adult younger than age 59 years who has certain medical conditions should be immunized. Any person who resides in a nursing home or long-term care facility should be immunized. An adult smoker should be immunized. People with an immunocompromised condition and certain other conditions should receive both PCV13 and PPSV23 vaccines. People with human immunodeficiency virus (HIV) infection should be immunized as soon as possible after diagnosis. Immunization during chemotherapy or radiation therapy should be avoided. Routine use of PPSV23 vaccine is not recommended for American Indians, [place] Natives, or people younger than 65 years unless there are medical conditions that require PPSV23 vaccine. When indicated, people who have unknown immunization and have no record of immunization should receive PPSV23 vaccine. One-time revaccination 5 years after the first dose of PPSV23 is recommended for people aged 19-64 years who have chronic kidney failure, nephrotic syndrome, asplenia, or immunocompromised conditions. People who received 1-2 doses of PPSV23 before age 50 years should receive another dose of PPSV23 vaccine at age 76 years or later if at least 5 years have passed since the previous dose. Doses of PPSV23 are not needed for people immunized with PPSV23 at or after  age 46 years.   Preventive Services / Frequency  Ages 82 years and over  Blood pressure check.  Lipid and cholesterol check.  Lung cancer screening. / Every year if you are aged 21-80 years and have a 30-pack-year history of smoking and currently smoke or have quit within the past 15 years. Yearly screening is stopped once you have quit smoking for at least 15 years or develop a health problem that would prevent you from having lung cancer treatment.  Clinical breast exam.** / Every year after age 41 years.  BRCA-related cancer risk assessment.** /  For women who have family members with a BRCA-related cancer (breast, ovarian, tubal, or peritoneal cancers).  Mammogram.** / Every year beginning at age 40 years and continuing for as long as you are in good health. Consult with your health care provider.  Pap test.** / Every 3 years starting at age 30 years through age 65 or 70 years with 3 consecutive normal Pap tests. Testing can be stopped between 65 and 70 years with 3 consecutive normal Pap tests and no abnormal Pap or HPV tests in the past 10 years.  Fecal occult blood test (FOBT) of stool. / Every year beginning at age 50 years and continuing until age 75 years. You may not need to do this test if you get a colonoscopy every 10 years.  Flexible sigmoidoscopy or colonoscopy.** / Every 5 years for a flexible sigmoidoscopy or every 10 years for a colonoscopy beginning at age 50 years and continuing until age 75 years.  Hepatitis C blood test.** / For all people born from 1945 through 1965 and any individual with known risks for hepatitis C.  Osteoporosis screening.** / A one-time screening for women ages 65 years and over and women at risk for fractures or osteoporosis.  Skin self-exam. / Monthly.  Influenza vaccine. / Every year.  Tetanus, diphtheria, and acellular pertussis (Tdap/Td) vaccine.** / 1 dose of Td every 10 years.  Zoster vaccine.** / 1 dose for adults aged 60 years  or older.  Pneumococcal 13-valent conjugate (PCV13) vaccine.** / Consult your health care provider.  Pneumococcal polysaccharide (PPSV23) vaccine.** / 1 dose for all adults aged 65 years and older. Screening for abdominal aortic aneurysm (AAA)  by ultrasound is recommended for people who have history of high blood pressure or who are current or former smokers. 

## 2015-02-07 NOTE — Progress Notes (Signed)
Patient ID: Sarah Hartman, female   DOB: 1951-02-26, 64 y.o.   MRN: [id number]  Initial Welcome to [facility]  Assessment:   1. Routine general medical examination at a health care facility   2. Labile hypertension  - EKG 12-Lead - TSH  3. Mixed hyperlipidemia   4. Prediabetes  - Hemoglobin A1c - Insulin, random  5. Vitamin D deficiency  - Vit D  25 hydroxy   6. Hypothyroidism   7. Bladder spasms  - hyoscyamine (LEVSIN SL) 0.125 MG SL tablet; Take 1 to 2 tablets 3 to 4 x day if needed for Bladder Spasms, Nausea, vomiting, cramping or diarrhea  Dispense: 90 tablet; Refill: 99  8. Screening for rectal cancer  - POC Hemoccult Bld/Stl (3-Cd Home Screen); Future  9. Depression screen   10. At low risk for fall   11. Screening for ischemic heart disease   12. Medication management  - Urine Microscopic - Microalbumin / creatinine urine ratio - CBC with Differential/Platelet - BASIC METABOLIC PANEL WITH GFR - Magnesium - Lipid panel   Plan:   During the course of the visit the patient was educated and counseled about appropriate screening and preventive services including:    Pneumococcal vaccine   Influenza vaccine  Td vaccine  Screening electrocardiogram  Bone densitometry screening  Colorectal cancer screening  Diabetes screening  Glaucoma screening  Nutrition counseling   Advanced directives: requested  Screening recommendations, referrals: Vaccinations:  Immunization History  Administered Date(s) Administered  . Pneumococcal Polysaccharide-23 11/09/2012  . Tdap 10/18/2008  Influenza vaccine 08/2014 at [place] ordered Shingles vaccine 08/2014 at [place] B vaccine not indicated  Nutrition assessed and recommended  Colonoscopy 08/30/2012 Recommended yearly ophthalmology/optometry visit for glaucoma screening and checkup Recommended yearly dental visit for hygiene and  checkup Advanced directives - No , offered forms  Conditions/risks identified: BMI: Discussed weight loss, diet, and increase physical activity.  Increase physical activity: AHA recommends 150 minutes of physical activity a week.  Medications reviewed PreDiabetes is at goal, ACE/ARB therapy: Not Indicated Urinary Incontinence is not an issue: discussed non pharmacology and pharmacology options.  Fall risk: low- discussed PT, home fall assessment, medications.   Subjective:    Sarah Hartman is a 64 y.o.  female who presents for Initial Welcome to  [facility]  Wellness Visit and complete physical.     She has had elevated blood pressure since 2007 and has been monitored expectantly. Her blood pressure has been controlled at home, & today her BP is BP: 128/88 mmHg She does workout. She denies chest pain, shortness of breath, dizziness.  She is not on cholesterol medication. Her cholesterol is near, but not at goal. The cholesterol last visit was:  Lab Results  Component Value Date   CHOL 202* 11/15/2013   HDL 76 11/15/2013   LDLCALC 104* 11/15/2013   TRIG 109 11/15/2013   CHOLHDL 2.7 11/15/2013   Patient has Hypothyroidism and is on replacement therapy predating since 2007. Periodic monitoring has confirmed euthyroid state.     She has had Prediabetes for 4  years (Jan 2012). She has been working on diet and exercise for prediabetes, and denies foot ulcerations, hyperglycemia, hypoglycemia , nausea, paresthesia of the feet, polydipsia, polyuria and visual disturbances. Last A1C in the office was:  Lab Results  Component Value Date   HGBA1C 5.4 11/15/2013     Patient has hx/o Vit D Deficiency and is is on Vitamin D supplement.  Lab Results  Component Value Date   VD25OH 84 11/15/2013     Names of Other Physician/Practitioners you currently use: 1. [facility] here for primary care 2. Dr Leonarda Salon, OD, eye doctor, last visit 2015 3. Dr Wyline Beady, DDS, dentist, last visit 2015  Patient Care Team: Unk Pinto, MD as PCP - General (Internal Medicine) Kathryne Hitch, MD as Consulting Physician (Orthopedic Surgery) Irene Shipper, MD as Consulting Physician (Gastroenterology) Festus Aloe, MD as Consulting Physician (Urology) Maisie Fus, MD as Consulting Physician (Obstetrics and Gynecology)  Medication Review: Medication Sig  . OCUVITE tablet Take 1 tablet by mouth daily.  Marland Kitchen  CALCIUM 600 + D  Take 600 mg by mouth daily.   Marland Kitchen levothyroxine  50 MCG tablet Take 1 tablet (50 mcg total) by mouth daily before breakfast.  . pantoprazole  40 MG  Take 1 tab daily. For acid indigestion or reflux  . Red Yeast Rice Extract  Take 1,200 mg by mouth daily.   Current Problems (verified) Patient Active Problem List   Diagnosis Date Noted  . Mixed hyperlipidemia 02/07/2015  . Prediabetes 02/07/2015  . Hypothyroidism 02/07/2015  . Medication management 02/07/2015  . Vitamin D deficiency 11/15/2013  . GERD (gastroesophageal reflux disease)   . Macular degeneration of left eye   . IBS (irritable bowel syndrome)   . RLS (restless legs syndrome)   . Labile hypertension    Screening Tests Health Maintenance  Topic Date Due  . MAMMOGRAM  01/31/2000  . ZOSTAVAX  01/30/2010  . PNA vac Low Risk Adult (1 of 2 - PCV13) 01/31/2015  . DEXA SCAN  01/31/2015  . INFLUENZA VACCINE  06/03/2015  . COLONOSCOPY  08/30/2017  . TETANUS/TDAP  10/18/2018  . HIV Screening  Completed   Immunization History  Administered Date(s) Administered  . Pneumococcal Polysaccharide-23 11/09/2012  . Tdap 10/18/2008   Preventative care: Last colonoscopy: 08/30/2012 Dr Henrene Pastor recc 5 yr f/u  History reviewed: allergies, current medications, past family history, past medical history, past social history, past surgical history and problem list  Risk Factors: Tobacco History  Substance Use Topics  . Smoking status: Former Smoker    Quit date: 04/24/1981   . Smokeless tobacco: Never Used  . Alcohol Use: 1.8 oz/week    3 Glasses of wine per week     Comment: occ   She does not smoke.  Patient is a former smoker. Are there smokers in your home (other than you)?  No Alcohol Current alcohol use: none  Caffeine Current caffeine use: denies use  Exercise Current exercise: walking and treadmill   Nutrition/Diet Current diet: in general, a "healthy" diet    Cardiac risk factors: advanced age (older than 107 for men, 70 for women), dyslipidemia, hypertension, sedentary lifestyle and smoking/ tobacco exposure.  Depression Screen (Note: if answer to either of the following is "Yes", a more complete depression screening is indicated)   Q1: Over the past two weeks, have you felt down, depressed or hopeless? No  Q2: Over the past two weeks, have you felt little interest or pleasure in doing things? No  Have you lost interest or pleasure in daily life? No  Do you often feel hopeless? No  Do you cry easily over simple problems? No  Activities of Daily Living In your present state of health, do you have any difficulty performing the following activities?:  Driving? No Managing money?  No Feeding yourself? No Getting from bed to chair?  No Climbing a flight of stairs? No Preparing food and eating?: No Bathing or showering? No Getting dressed: No Getting to the toilet? No Using the toilet:No Moving around from place to place: No In the past year have you fallen or had a near fall?:No   Are you sexually active?  Yes  Do you have more than one partner?  No  Vision Difficulties: No  Hearing Difficulties: No Do you often ask people to speak up or repeat themselves? No Do you experience ringing or noises in your ears? No Do you have difficulty understanding soft or whispered voices? Sometimes.  Cognition  Do you feel that you have a problem with memory?No  Do you often misplace items? No  Do you feel safe at home?  Yes  Advanced  directives Does patient have a [place]? No Does patient have a Living Will? No  Past Medical History  Diagnosis Date  . Macular degeneration of left eye   . Adenomatous colon polyp   . GERD (gastroesophageal reflux disease)   . Thyroid disease     hypothyroid  . IBS (irritable bowel syndrome)   . Vitamin D deficiency   . RLS (restless legs syndrome)   . Labile hypertension    Past Surgical History  Procedure Laterality Date  . Colonoscopy    . Tubal ligation    . Tendon reconstruction Right 04/27/2013    Procedure: RIGHT ELBOW DEBRIDE/REPAIR/EXPLORE - LATERAL HUMERAL EPICONDYLE;  Surgeon: Ninetta Lights, MD;  Location: [place];  Service: Orthopedics;  Laterality: Right;   ROS: Constitutional: Denies fever, chills, weight loss/gain, headaches, insomnia, fatigue, night sweats, and change in appetite. Eyes: Denies redness, blurred vision, diplopia, discharge, itchy, watery eyes.  ENT: Denies discharge, congestion, post nasal drip, epistaxis, sore throat, earache, hearing loss, dental pain, Tinnitus, Vertigo, Sinus pain, snoring.  Cardio: Denies chest pain, palpitations, irregular heartbeat, syncope, dyspnea, diaphoresis, orthopnea, PND, claudication, edema Respiratory: denies cough, dyspnea, DOE, pleurisy, hoarseness, laryngitis, wheezing.  Gastrointestinal: Denies dysphagia, heartburn, reflux, water brash, pain, cramps, nausea, vomiting, bloating, diarrhea, constipation, hematemesis, melena, hematochezia, jaundice, hemorrhoids Genitourinary: has intermittent suprapubic discomfort and has had negative w/u by both GYN & Urology. Breast: Breast lumps, nipple discharge, bleeding.  Musculoskeletal: Denies arthralgia, myalgia, stiffness, Jt. Swelling, pain, limp, and strain/sprain. Denies falls. Skin: Denies puritis, rash, hives, warts, acne, eczema, changing in skin lesion Neuro: No weakness, tremor, incoordination, spasms, paresthesia,  pain Psychiatric: Denies confusion, memory loss, sensory loss. Denies Depression. Endocrine: Denies change in weight, skin, hair change, nocturia, and paresthesia, diabetic polys, visual blurring, hyper / hypo glycemic episodes.  Heme/Lymph: No excessive bleeding, bruising, enlarged lymph nodes  Objective:     BP 128/88   Pulse 68  Temp 97.7 F   Resp 16  Ht 5\' 3"    Wt 137 lb    BMI 24.27   General Appearance: Well nourished, alert, WD/WN, female and in no apparent distress. Eyes: PERRLA, EOMs, conjunctiva no swelling or erythema, normal fundi and vessels. Sinuses: No frontal/maxillary tenderness ENT/Mouth: EACs patent / TMs  nl. Nares clear without erythema, swelling, mucoid exudates. Oral hygiene is good. No erythema, swelling, or exudate. Tongue normal, non-obstructing. Tonsils not swollen or erythematous. Hearing normal.  Neck: Supple, thyroid normal. No bruits, nodes or JVD. Respiratory: Respiratory effort normal.  BS equal and clear bilateral without rales, rhonci, wheezing or stridor. Cardio: Heart sounds are normal with regular rate and rhythm and no murmurs, rubs or gallops. Peripheral pulses are  normal and equal bilaterally without edema. No aortic or femoral bruits. Chest: symmetric with normal excursions and percussion. Breasts: Symmetric, without lumps, nipple discharge, retractions, or fibrocystic changes.  Abdomen: Flat, soft  with nl bowel sounds. Nontender, no guarding, rebound, hernias, masses, or organomegaly.  Lymphatics: Non tender without lymphadenopathy.  Musculoskeletal: Full ROM all peripheral extremities, joint stability, 5/5 strength, and normal gait. Skin: Warm and dry without rashes, lesions, cyanosis, clubbing or  ecchymosis.  Neuro: Cranial nerves intact, reflexes equal bilaterally. Normal muscle tone, no cerebellar symptoms. Sensation intact.  Pysch: Alert and oriented X 3, normal affect, Insight and Judgment appropriate.   Cognitive Testing  Alert? Yes   Normal Appearance?Yes  Oriented to person? Yes  Place? Yes   Time? Yes  Recall of three objects?  Yes  Can perform simple calculations? Yes  Displays appropriate judgment? Yes  Can read the correct time from a watch/clock?Yes  Medicare Attestation I have personally reviewed: The patient's medical and social history Their use of alcohol, tobacco or illicit drugs Their current medications and supplements The patient's functional ability including ADLs,fall risks, home safety risks, cognitive, and hearing and visual impairment Diet and physical activities Evidence for depression or mood disorders  The patient's weight, height, BMI, and visual acuity have been recorded in the chart.  I have made referrals, counseling, and provided education to the patient based on review of the above and I have provided the patient with a written personalized care plan for preventive services.  Over 40 minutes of exam, counseling, chart review was performed.   Chidinma Clites DAVID, MD   02/07/2015

## 2015-02-08 LAB — VITAMIN D 25 HYDROXY (VIT D DEFICIENCY, FRACTURES): VIT D 25 HYDROXY: 60 ng/mL (ref 30–100)

## 2015-02-08 LAB — MICROALBUMIN / CREATININE URINE RATIO
CREATININE, URINE: 145 mg/dL
Microalb Creat Ratio: 3.4 mg/g (ref 0.0–30.0)
Microalb, Ur: 0.5 mg/dL (ref <2.0)

## 2015-02-08 LAB — INSULIN, RANDOM: INSULIN: 44 u[IU]/mL — AB (ref 2.0–19.6)

## 2015-02-08 LAB — URINALYSIS, MICROSCOPIC ONLY
BACTERIA UA: NONE SEEN
Casts: NONE SEEN
Crystals: NONE SEEN
Squamous Epithelial / LPF: NONE SEEN

## 2015-02-10 NOTE — Addendum Note (Signed)
Addended by: Maegen Wigle A on: 02/10/2015 09:48 AM   Modules accepted: Orders

## 2015-02-22 ENCOUNTER — Other Ambulatory Visit: Payer: Self-pay | Admitting: *Deleted

## 2015-02-22 DIAGNOSIS — Z1212 Encounter for screening for malignant neoplasm of rectum: Secondary | ICD-10-CM

## 2015-02-22 LAB — POC HEMOCCULT BLD/STL (HOME/3-CARD/SCREEN)
Card #2 Fecal Occult Blod, POC: NEGATIVE
FECAL OCCULT BLD: NEGATIVE
FECAL OCCULT BLD: NEGATIVE

## 2015-03-16 ENCOUNTER — Encounter: Payer: Self-pay | Admitting: *Deleted

## 2015-04-29 ENCOUNTER — Other Ambulatory Visit: Payer: Self-pay

## 2015-06-24 ENCOUNTER — Encounter: Payer: Self-pay | Admitting: Internal Medicine

## 2015-10-25 ENCOUNTER — Other Ambulatory Visit: Payer: Self-pay | Admitting: Internal Medicine

## 2015-11-29 ENCOUNTER — Encounter: Payer: Self-pay | Admitting: Internal Medicine

## 2015-12-23 ENCOUNTER — Ambulatory Visit (INDEPENDENT_AMBULATORY_CARE_PROVIDER_SITE_OTHER): Payer: PPO | Admitting: Physician Assistant

## 2015-12-23 ENCOUNTER — Encounter: Payer: Self-pay | Admitting: Physician Assistant

## 2015-12-23 VITALS — BP 126/80 | HR 73 | Temp 98.4°F | Resp 16 | Ht 63.0 in | Wt 143.0 lb

## 2015-12-23 DIAGNOSIS — E2839 Other primary ovarian failure: Secondary | ICD-10-CM | POA: Diagnosis not present

## 2015-12-23 DIAGNOSIS — E559 Vitamin D deficiency, unspecified: Secondary | ICD-10-CM | POA: Diagnosis not present

## 2015-12-23 DIAGNOSIS — G2581 Restless legs syndrome: Secondary | ICD-10-CM

## 2015-12-23 DIAGNOSIS — Z0001 Encounter for general adult medical examination with abnormal findings: Secondary | ICD-10-CM

## 2015-12-23 DIAGNOSIS — R7303 Prediabetes: Secondary | ICD-10-CM | POA: Diagnosis not present

## 2015-12-23 DIAGNOSIS — R0989 Other specified symptoms and signs involving the circulatory and respiratory systems: Secondary | ICD-10-CM

## 2015-12-23 DIAGNOSIS — J0100 Acute maxillary sinusitis, unspecified: Secondary | ICD-10-CM

## 2015-12-23 DIAGNOSIS — H3530 Unspecified macular degeneration: Secondary | ICD-10-CM

## 2015-12-23 DIAGNOSIS — Z79899 Other long term (current) drug therapy: Secondary | ICD-10-CM

## 2015-12-23 DIAGNOSIS — R6889 Other general symptoms and signs: Secondary | ICD-10-CM | POA: Diagnosis not present

## 2015-12-23 DIAGNOSIS — E782 Mixed hyperlipidemia: Secondary | ICD-10-CM | POA: Diagnosis not present

## 2015-12-23 DIAGNOSIS — I10 Essential (primary) hypertension: Secondary | ICD-10-CM

## 2015-12-23 DIAGNOSIS — E039 Hypothyroidism, unspecified: Secondary | ICD-10-CM

## 2015-12-23 DIAGNOSIS — K219 Gastro-esophageal reflux disease without esophagitis: Secondary | ICD-10-CM | POA: Diagnosis not present

## 2015-12-23 DIAGNOSIS — K589 Irritable bowel syndrome without diarrhea: Secondary | ICD-10-CM

## 2015-12-23 DIAGNOSIS — Z0000 Encounter for general adult medical examination without abnormal findings: Secondary | ICD-10-CM

## 2015-12-23 MED ORDER — AZITHROMYCIN 250 MG PO TABS: ORAL_TABLET | ORAL | Status: AC

## 2015-12-23 MED ORDER — BENZONATATE 100 MG PO CAPS: 200.0000 mg | ORAL_CAPSULE | Freq: Three times a day (TID) | ORAL | Status: DC | PRN

## 2015-12-23 MED ORDER — DEXAMETHASONE SODIUM PHOSPHATE 100 MG/10ML IJ SOLN
10.0000 mg | Freq: Once | INTRAMUSCULAR | Status: AC
  Administered 2015-12-23: 10 mg via INTRAMUSCULAR

## 2015-12-23 MED ORDER — PREDNISONE 20 MG PO TABS: ORAL_TABLET | ORAL | Status: DC

## 2015-12-23 NOTE — Progress Notes (Signed)
Patient ID: MAILLE SCHEURING, female   DOB: [birth year], 65 y.o.   MRN: [id number]   MEDICARE WELLNESS VISIT AND sick visit  Assessment:   1. Labile hypertension - continue medications, DASH diet, exercise and monitor at home. Call if greater than 130/80.   2. Hypothyroidism, unspecified hypothyroidism type Hypothyroidism-check TSH level, continue medications the same, reminded to take on an empty stomach 30-80mins before food.   3. Prediabetes monitor  4. Mixed hyperlipidemia -continue medications, check lipids, decrease fatty foods, increase activity.   5. IBS (irritable bowel syndrome) Diet controlled  6. Vitamin D deficiency  7. Medication management  8. RLS (restless legs syndrome) Continue meds, walk  9. Gastroesophageal reflux disease, esophagitis presence not specified Continue PPI/H2 blocker, diet discussed  10. Macular degeneration of left eye Continue eye doctor follow up  11. Acute maxillary sinusitis, recurrence not specified Will hold the zpak and take if she is not getting better, increase fluids, rest, cont allergy pill - dexamethasone (DECADRON) injection 10 mg; Inject 1 mL (10 mg total) into the muscle once.  12. Encounter for Medicare annual wellness exam DEXA with next MGM  Plan:   During the course of the visit the patient was educated and counseled about appropriate screening and preventive services including:    Pneumococcal vaccine   Influenza vaccine  Td vaccine  Screening electrocardiogram  Bone densitometry screening  Colorectal cancer screening  Diabetes screening  Glaucoma screening  Nutrition counseling   Advanced directives: requested  Conditions/risks identified: BMI: Discussed weight loss, diet, and increase physical activity.  Increase physical activity: AHA recommends 150 minutes of physical activity a week.  Medications reviewed PreDiabetes is at goal, ACE/ARB therapy: Not Indicated Urinary Incontinence is not an  issue: discussed non pharmacology and pharmacology options.  Fall risk: low- discussed PT, home fall assessment, medications.   Subjective:   ADABEL SWORDS is a 65 y.o.  female who presents for Wellness Visit and acute sick visit.  Has been feeling bad since Wednesday with sore throat, then worse Saturday, no fever, chills, cough with mucus, sinus congestion with dental pain, no SOB, wheezing, CP.  She has had elevated blood pressure since 2007 and has been monitored expectantly. Her blood pressure has been controlled at home, & today her BP is BP: 126/80 mmHg She does workout. She denies chest pain, shortness of breath, dizziness.  She is not on cholesterol medication. Her cholesterol is near, but not at goal. The cholesterol last visit was:  Lab Results  Component Value Date   CHOL 189 02/07/2015   HDL 61 02/07/2015   LDLCALC 114* 02/07/2015   TRIG 72 02/07/2015   CHOLHDL 3.1 02/07/2015   She is on thyroid medication. Her medication was not changed last visit.   Lab Results  Component Value Date   TSH 3.087 02/07/2015   She has had Prediabetes for 4  years (Jan 2012). She has been working on diet and exercise for prediabetes, and denies foot ulcerations, hyperglycemia, hypoglycemia , nausea, paresthesia of the feet, polydipsia, polyuria and visual disturbances. Last A1C in the office was:  Lab Results  Component Value Date   HGBA1C 5.6 02/07/2015   Patient has hx/o Vit D Deficiency and is is on Vitamin D supplement.   Lab Results  Component Value Date   VD25OH 60 02/07/2015     Names of Other Physician/Practitioners you currently use: 1. [facility] here for primary care 2. Dr Leonarda Salon, OD, eye doctor, last visit  2016 3. Dr Wyline Beady, DDS, dentist, last visit 2016  Patient Care Team: Unk Pinto, MD as PCP - General (Internal Medicine) Ninetta Lights, MD as Consulting Physician (Orthopedic Surgery) Irene Shipper, MD as  Consulting Physician (Gastroenterology) Festus Aloe, MD as Consulting Physician (Urology) Maisie Fus, MD as Consulting Physician (Obstetrics and Gynecology)  Medication Review:   Medication List       This list is accurate as of: 12/23/15  4:02 PM.  Always use your most recent med list.               beta carotene w/minerals tablet  Take 1 tablet by mouth daily.     CALCIUM 600 + D PO  Take 600 mg by mouth daily.     levothyroxine 50 MCG tablet  Commonly known as:  SYNTHROID, LEVOTHROID  TAKE ONE TABLET BY MOUTH ONCE DAILY BEFORE  BREAKFAST     RED YEAST RICE PO  Take 1,200 mg by mouth daily.       Current Problems (verified) Patient Active Problem List   Diagnosis Date Noted  . Mixed hyperlipidemia 02/07/2015  . Prediabetes 02/07/2015  . Hypothyroidism 02/07/2015  . Medication management 02/07/2015  . Vitamin D deficiency 11/15/2013  . GERD (gastroesophageal reflux disease)   . Macular degeneration of left eye   . IBS (irritable bowel syndrome)   . RLS (restless legs syndrome)   . Labile hypertension    Screening Tests Health Maintenance  Topic Date Due  . PAP SMEAR  01/31/1971  . ZOSTAVAX  01/30/2010  . DEXA SCAN  01/31/2015  . PNA vac Low Risk Adult (1 of 2 - PCV13) 01/31/2015  . INFLUENZA VACCINE  06/03/2015  . MAMMOGRAM  01/14/2017  . COLONOSCOPY  08/30/2017  . TETANUS/TDAP  10/18/2018  . Hepatitis C Screening  Completed  . HIV Screening  Completed   Immunization History  Administered Date(s) Administered  . Pneumococcal Polysaccharide-23 11/09/2012  . Tdap 10/18/2008   Preventative care: Last colonoscopy: 08/30/2012 Dr Henrene Pastor recc 5 yr f/u [facility] 01/15/2015 PAP: remote CXR 2004  Prevnar 13: DUE Pneumococcal 23: 2014 Influenza 2016 at [facility] Shingles 08/2014 at [facility]  History reviewed: allergies, current medications, past family history, past medical history, past social history, past surgical history and problem list  Risk  Factors: Tobacco Social History  Substance Use Topics  . Smoking status: Former Smoker    Quit date: 04/24/1981  . Smokeless tobacco: Never Used  . Alcohol Use: 1.8 oz/week    3 Glasses of wine per week     Comment: occ    Past Surgical History  Procedure Laterality Date  . Colonoscopy    . Tubal ligation    . Tendon reconstruction Right 04/27/2013    Procedure: RIGHT ELBOW DEBRIDE/REPAIR/EXPLORE - LATERAL HUMERAL EPICONDYLE;  Surgeon: Ninetta Lights, MD;  Location: [place];  Service: Orthopedics;  Laterality: Right;   Family History  Problem Relation Age of Onset  . Alzheimer's disease Mother   . Hypertension Father   . Cerebral aneurysm Father    MEDICARE WELLNESS OBJECTIVES: Tobacco use: She does not smoke.  Patient is a former smoker. If yes, counseling given Alcohol Current alcohol use: none Osteoporosis: postmenopausal estrogen deficiency and dietary calcium and/or vitamin D deficiency, History of fracture in the past year: no Diet: in general, a "healthy" diet   Physical activity: Current Exercise Habits:: Home exercise routine, Type of exercise: walking, Time (Minutes): 20, Frequency (Times/Week): 4, Weekly Exercise (Minutes/Week):  80, Intensity: Mild Cardiac risk factors: Cardiac Risk Factors include: advanced age (>71men, >25 women);dyslipidemia;hypertension;sedentary lifestyle Depression/mood screen:   Depression screen [facility] 2/9 12/23/2015  Decreased Interest 0  Down, Depressed, Hopeless 0  PHQ - 2 Score 0    ADLs:  In your present state of health, do you have any difficulty performing the following activities: 12/23/2015  Hearing? N  Vision? N  Difficulty concentrating or making decisions? N  Walking or climbing stairs? N  Dressing or bathing? N  Doing errands, shopping? N  Preparing Food and eating ? N  Using the Toilet? N  In the past six months, have you accidently leaked urine? N  Do you have problems with loss of bowel control? N   Managing your Medications? N  Managing your Finances? N  Housekeeping or managing your Housekeeping? N     Cognitive Testing  Alert? Yes  Normal Appearance?Yes  Oriented to person? Yes  Place? Yes   Time? Yes  Recall of three objects?  Yes  Can perform simple calculations? Yes  Displays appropriate judgment?Yes  Can read the correct time from a watch face?Yes  EOL planning: Does patient have an advance directive?: No Would patient like information on creating an advanced directive?: Yes - Educational materials given   Objective:     Blood pressure 126/80, pulse 73, temperature 98.4 F (36.9 C), temperature source Temporal, resp. rate 16, height 5\' 3"  (1.6 m), weight 143 lb (64.864 kg), SpO2 96 %.   General Appearance: Well nourished, alert, WD/WN, female and in no apparent distress. Eyes: PERRLA, EOMs, conjunctiva no swelling or erythema, normal fundi and vessels. Sinuses: + frontal/maxillary tenderness ENT/Mouth: EACs patent / TMs  nl. Nares clear without erythema, swelling, mucoid exudates. Oral hygiene is good. No erythema, swelling, or exudate. Tongue normal, non-obstructing. Tonsils not swollen or erythematous. Hearing normal.  Neck: Supple, thyroid normal. No bruits, nodes or JVD. Respiratory: Respiratory effort normal.  BS equal and clear bilateral without rales, rhonci, wheezing or stridor. Cardio: Heart sounds are normal with regular rate and rhythm and no murmurs, rubs or gallops. Peripheral pulses are normal and equal bilaterally without edema. No aortic or femoral bruits. Chest: symmetric with normal excursions and percussion. Breasts: Symmetric, without lumps, nipple discharge, retractions, or fibrocystic changes.  Abdomen: Flat, soft  with nl bowel sounds. Nontender, no guarding, rebound, hernias, masses, or organomegaly.  Lymphatics: Non tender without lymphadenopathy.  Musculoskeletal: Full ROM all peripheral extremities, joint stability, 5/5 strength, and normal  gait. Skin: Warm and dry without rashes, lesions, cyanosis, clubbing or  ecchymosis.  Neuro: Cranial nerves intact, reflexes equal bilaterally. Normal muscle tone, no cerebellar symptoms. Sensation intact.  Pysch: Alert and oriented X 3, normal affect, Insight and Judgment appropriate.   Cognitive Testing  Alert? Yes  Normal Appearance?Yes  Oriented to person? Yes  Place? Yes   Time? Yes  Recall of three objects?  Yes  Can perform simple calculations? Yes  Displays appropriate judgment? Yes  Can read the correct time from a watch/clock?Yes  Medicare Attestation I have personally reviewed: The patient's medical and social history Their use of alcohol, tobacco or illicit drugs Their current medications and supplements The patient's functional ability including ADLs,fall risks, home safety risks, cognitive, and hearing and visual impairment Diet and physical activities Evidence for depression or mood disorders  The patient's weight, height, BMI, and visual acuity have been recorded in the chart.  I have made referrals, counseling, and provided education to the patient based on review  of the above and I have provided the patient with a written personalized care plan for preventive services.  Over 40 minutes of exam, counseling, chart review was performed.   Vicie Mutters, PA-C   12/23/2015

## 2015-12-23 NOTE — Patient Instructions (Signed)
Please take the prednisone to help decrease inflammation and therefore decrease symptoms. Take it it with food to avoid GI upset. It can cause increased energy but on the other hand it can make it hard to sleep at night so please take it AT [place], it takes 8-12 hours to start working so it will NOT affect your sleeping if you take it at night with your food!!  If you are diabetic it will increase your sugars so decrease carbs and monitor your sugars closely.    You will get the shot, take 2-3 days if you are not getting better than take the prednisone pills and the zpak  HOW TO TREAT VIRAL COUGH AND COLD SYMPTOMS:  -Symptoms usually last at least 1 week with the worst symptoms being around day 4.  - colds usually start with a sore throat and end with a cough, and the cough can take 2 weeks to get better.  -No antibiotics are needed for colds, flu, sore throats, cough, bronchitis UNLESS symptoms are longer than 7 days OR if you are getting better then get drastically worse.  -There are a lot of combination medications (Dayquil, Nyquil, Vicks 44, tyelnol cold and sinus, ETC). Please look at the ingredients on the back so that you are treating the correct symptoms and not doubling up on medications/ingredients.    Medicines you can use  Nasal congestion  - pseudoephedrine (Sudafed)- behind the counter, do not use if you have high blood pressure, medicine that have -D in them.  - phenylephrine (Sudafed PE) -Dextormethorphan + chlorpheniramine (Coridcidin HBP)- okay if you have high blood pressure -Oxymetazoline (Afrin) nasal spray- LIMIT to 3 days -Saline nasal spray -Neti pot (used distilled or bottled water)  Ear pain/congestion  -pseudoephedrine (sudafed) - Nasonex/flonase nasal spray  Fever  -Acetaminophen (Tyelnol) -Ibuprofen (Advil, motrin, aleve)  Sore Throat  -Acetaminophen (Tyelnol) -Ibuprofen (Advil, motrin, aleve) -Drink a lot of water -Gargle with salt water -  Rest your voice (don't talk) -Throat sprays -Cough drops  Body Aches  -Acetaminophen (Tyelnol) -Ibuprofen (Advil, motrin, aleve)  Headache  -Acetaminophen (Tyelnol) -Ibuprofen (Advil, motrin, aleve) - Exedrin, Exedrin Migraine  Allergy symptoms (cough, sneeze, runny nose, itchy eyes) -Claritin or loratadine cheapest but likely the weakest  -Zyrtec or certizine at night because it can make you sleepy -The strongest is allegra or fexafinadine  Cheapest at [employer], [employer], [employer]  Cough  -Dextromethorphan (Delsym)- medicine that has DM in it -Guafenesin (Mucinex/Robitussin) - cough drops - drink lots of water  Chest Congestion  -Guafenesin (Mucinex/Robitussin)  Red Itchy Eyes  - Naphcon-A  Upset Stomach  - Bland diet (nothing spicy, greasy, fried, and high acid foods like tomatoes, oranges, berries) -OKAY- cereal, bread, soup, crackers, rice -Eat smaller more frequent meals -reduce caffeine, no alcohol -Loperamide (Imodium-AD) if diarrhea -Prevacid for heart burn  General health when sick  -Hydration -wash your hands frequently -keep surfaces clean -change pillow cases and sheets often -Get fresh air but do not exercise strenuously -Vitamin D, double up on it - Vitamin C -Zinc

## 2016-01-17 DIAGNOSIS — Z1231 Encounter for screening mammogram for malignant neoplasm of breast: Secondary | ICD-10-CM | POA: Diagnosis not present

## 2016-01-27 DIAGNOSIS — R3915 Urgency of urination: Secondary | ICD-10-CM | POA: Diagnosis not present

## 2016-01-27 DIAGNOSIS — N390 Urinary tract infection, site not specified: Secondary | ICD-10-CM | POA: Diagnosis not present

## 2016-01-27 DIAGNOSIS — Z6826 Body mass index (BMI) 26.0-26.9, adult: Secondary | ICD-10-CM | POA: Diagnosis not present

## 2016-01-27 DIAGNOSIS — Z124 Encounter for screening for malignant neoplasm of cervix: Secondary | ICD-10-CM | POA: Diagnosis not present

## 2016-03-03 ENCOUNTER — Ambulatory Visit (INDEPENDENT_AMBULATORY_CARE_PROVIDER_SITE_OTHER): Payer: PPO | Admitting: Internal Medicine

## 2016-03-03 ENCOUNTER — Encounter: Payer: Self-pay | Admitting: Internal Medicine

## 2016-03-03 ENCOUNTER — Other Ambulatory Visit: Payer: Self-pay | Admitting: *Deleted

## 2016-03-03 VITALS — BP 138/84 | HR 76 | Temp 97.8°F | Resp 16 | Ht 63.0 in | Wt 142.6 lb

## 2016-03-03 DIAGNOSIS — R7303 Prediabetes: Secondary | ICD-10-CM | POA: Diagnosis not present

## 2016-03-03 DIAGNOSIS — I10 Essential (primary) hypertension: Secondary | ICD-10-CM | POA: Diagnosis not present

## 2016-03-03 DIAGNOSIS — Z136 Encounter for screening for cardiovascular disorders: Secondary | ICD-10-CM

## 2016-03-03 DIAGNOSIS — Z79899 Other long term (current) drug therapy: Secondary | ICD-10-CM

## 2016-03-03 DIAGNOSIS — Z1212 Encounter for screening for malignant neoplasm of rectum: Secondary | ICD-10-CM

## 2016-03-03 DIAGNOSIS — R0989 Other specified symptoms and signs involving the circulatory and respiratory systems: Secondary | ICD-10-CM

## 2016-03-03 DIAGNOSIS — E782 Mixed hyperlipidemia: Secondary | ICD-10-CM

## 2016-03-03 DIAGNOSIS — E039 Hypothyroidism, unspecified: Secondary | ICD-10-CM

## 2016-03-03 DIAGNOSIS — R7309 Other abnormal glucose: Secondary | ICD-10-CM | POA: Diagnosis not present

## 2016-03-03 DIAGNOSIS — E559 Vitamin D deficiency, unspecified: Secondary | ICD-10-CM | POA: Diagnosis not present

## 2016-03-03 DIAGNOSIS — K219 Gastro-esophageal reflux disease without esophagitis: Secondary | ICD-10-CM

## 2016-03-03 DIAGNOSIS — Z0001 Encounter for general adult medical examination with abnormal findings: Secondary | ICD-10-CM

## 2016-03-03 DIAGNOSIS — K589 Irritable bowel syndrome without diarrhea: Secondary | ICD-10-CM

## 2016-03-03 LAB — BASIC METABOLIC PANEL WITH GFR
BUN: 13 mg/dL (ref 7–25)
CALCIUM: 10 mg/dL (ref 8.6–10.4)
CO2: 25 mmol/L (ref 20–31)
Chloride: 104 mmol/L (ref 98–110)
Creat: 0.84 mg/dL (ref 0.50–0.99)
GFR, EST AFRICAN AMERICAN: 84 mL/min (ref >=60)
GFR, EST NON AFRICAN AMERICAN: 73 mL/min (ref >=60)
GLUCOSE: 96 mg/dL (ref 65–99)
POTASSIUM: 4.2 mmol/L (ref 3.5–5.3)
SODIUM: 139 mmol/L (ref 135–146)

## 2016-03-03 LAB — CBC WITH DIFFERENTIAL/PLATELET
BASOS PCT: 1 %
Basophils Absolute: 43 cells/uL (ref 0–200)
EOS PCT: 1 %
Eosinophils Absolute: 43 cells/uL (ref 15–500)
HCT: 38.2 % (ref 35.0–45.0)
Hemoglobin: 13.1 g/dL (ref 11.7–15.5)
LYMPHS PCT: 46 %
Lymphs Abs: 1978 cells/uL (ref 850–3900)
MCH: 31.1 pg (ref 27.0–33.0)
MCHC: 34.3 g/dL (ref 32.0–36.0)
MCV: 90.7 fL (ref 80.0–100.0)
MONO ABS: 215 {cells}/uL (ref 200–950)
MONOS PCT: 5 %
MPV: 8.9 fL (ref 7.5–12.5)
NEUTROS PCT: 47 %
Neutro Abs: 2021 cells/uL (ref 1500–7800)
PLATELETS: 300 10*3/uL (ref 140–400)
RBC: 4.21 MIL/uL (ref 3.80–5.10)
RDW: 13.6 % (ref 11.0–15.0)
WBC: 4.3 10*3/uL (ref 3.8–10.8)

## 2016-03-03 LAB — HEPATIC FUNCTION PANEL
ALT: 16 U/L (ref 6–29)
AST: 22 U/L (ref 10–35)
Albumin: 4.5 g/dL (ref 3.6–5.1)
Alkaline Phosphatase: 30 U/L — ABNORMAL LOW (ref 33–130)
BILIRUBIN INDIRECT: 0.5 mg/dL (ref 0.2–1.2)
BILIRUBIN TOTAL: 0.6 mg/dL (ref 0.2–1.2)
Bilirubin, Direct: 0.1 mg/dL (ref <=0.2)
TOTAL PROTEIN: 6.7 g/dL (ref 6.1–8.1)

## 2016-03-03 LAB — LIPID PANEL
CHOLESTEROL: 187 mg/dL (ref 125–200)
HDL: 76 mg/dL (ref >=46)
LDL CALC: 93 mg/dL (ref <130)
TRIGLYCERIDES: 91 mg/dL (ref <150)
Total CHOL/HDL Ratio: 2.5 Ratio (ref <=5.0)
VLDL: 18 mg/dL (ref <30)

## 2016-03-03 LAB — HEMOGLOBIN A1C
Hgb A1c MFr Bld: 5.3 % (ref <5.7)
MEAN PLASMA GLUCOSE: 105 mg/dL

## 2016-03-03 LAB — MAGNESIUM: Magnesium: 2 mg/dL (ref 1.5–2.5)

## 2016-03-03 LAB — TSH: TSH: 1.77 mIU/L

## 2016-03-03 MED ORDER — PANTOPRAZOLE SODIUM 40 MG PO TBEC: 40.0000 mg | DELAYED_RELEASE_TABLET | Freq: Every day | ORAL | Status: DC

## 2016-03-03 NOTE — Patient Instructions (Signed)
Recommend Adult Low Dose Aspirin or   coated  Aspirin 81 mg daily   To reduce risk of Colon Cancer 20 %,   Skin Cancer 26 % ,   Melanoma 46%   and   Pancreatic cancer 60%   ++++++++++++++++++++++++++++++++++++++++++++++++++++++ Vitamin D goal   is between 70-100.   Please make sure that you are taking your Vitamin D as directed.   It is very important as a natural anti-inflammatory   helping hair, skin, and nails, as well as reducing stroke and heart attack risk.   It helps your bones and helps with mood.  It also decreases numerous cancer risks so please take it as directed.   Low Vit D is associated with a 200-300% higher risk for CANCER   and 200-300% higher risk for HEART   ATTACK  &  STROKE.   .....................................Marland Kitchen  It is also associated with higher death rate at younger ages,   autoimmune diseases like Rheumatoid arthritis, Lupus, Multiple Sclerosis.     Also many other serious conditions, like depression, Alzheimer's  Dementia, infertility, muscle aches, fatigue, fibromyalgia - just to name a few.  ++++++++++++++++++++++++++++++++++++++++++++++++  Recommend the book "The END of DIETING" by Dr Excell Seltzer   & the book "The END of DIABETES " by Dr Excell Seltzer  At [email or web address] - get book & Audio CD's     Being diabetic has a  300% increased risk for heart attack, stroke, cancer, and alzheimer- type vascular dementia. It is very important that you work harder with diet by avoiding all foods that are white. Avoid white rice (brown & wild rice is OK), white potatoes (sweetpotatoes in moderation is OK), White bread or wheat bread or anything made out of white flour like bagels, donuts, rolls, buns, biscuits, cakes, pastries, cookies, pizza crust, and pasta (made from white flour & egg whites) - vegetarian pasta or spinach or wheat pasta is OK. Multigrain breads like Arnold's or Pepperidge [employer], or multigrain sandwich thins or flatbreads.  Diet,  exercise and weight loss can reverse and cure diabetes in the early stages.  Diet, exercise and weight loss is very important in the control and prevention of complications of diabetes which affects every system in your body, ie. Brain - dementia/stroke, eyes - glaucoma/blindness, heart - heart attack/heart failure, kidneys - dialysis, stomach - gastric paralysis, intestines - malabsorption, nerves - severe painful neuritis, circulation - gangrene & loss of a leg(s), and finally cancer and Alzheimers.    I recommend avoid fried & greasy foods,  sweets/candy, white rice (brown or wild rice or Quinoa is OK), white potatoes (sweet potatoes are OK) - anything made from white flour - bagels, doughnuts, rolls, buns, biscuits,white and wheat breads, pizza crust and traditional pasta made of white flour & egg white(vegetarian pasta or spinach or wheat pasta is OK).  Multi-grain bread is OK - like multi-grain flat bread or sandwich thins. Avoid alcohol in excess. Exercise is also important.    Eat all the vegetables you want - avoid meat, especially red meat and dairy - especially cheese.  Cheese is the most concentrated form of trans-fats which is the worst thing to clog up our arteries. Veggie cheese is OK which can be found in the fresh produce section at [employer] or [employer] or [employer]  ++++++++++++++++++++++++++++++++++++++++++++++++++ DASH Eating Plan  DASH stands for "Dietary Approaches to Stop Hypertension."   The DASH eating plan is a healthy eating plan that has been shown to reduce high blood  pressure (hypertension). Additional health benefits may include reducing the risk of type 2 diabetes mellitus, heart disease, and stroke. The DASH eating plan may also help with weight loss.  WHAT DO I NEED TO KNOW ABOUT THE DASH EATING PLAN?  For the DASH eating plan, you will follow these general guidelines:  Choose foods with a percent daily value for sodium of less than 5% (as listed on the food  label).  Use salt-free seasonings or herbs instead of table salt or sea salt.  Check with your health care provider or pharmacist before using salt substitutes.  Eat lower-sodium products, often labeled as "lower sodium" or "no salt added."  Eat fresh foods.  Eat more vegetables, fruits, and low-fat dairy products.    Choose whole grains. Look for the word "whole" as the first word in the ingredient list.  Choose fish   Limit sweets, desserts, sugars, and sugary drinks.  Choose heart-healthy fats.  Eat veggie cheese   Eat more home-cooked food and less restaurant, buffet, and fast food.  Limit fried foods.  [place]uffaker foods using methods other than frying.  Limit canned vegetables. If you do use them, rinse them well to decrease the sodium.  When eating at a restaurant, ask that your food be prepared with less salt, or no salt if possible.                      WHAT FOODS CAN I EAT?  Read Dr Fara Olden Fuhrman's books on The End of Dieting & The End of Diabetes  Grains  Whole grain or whole wheat bread. Brown rice. Whole grain or whole wheat pasta. Quinoa, bulgur, and whole grain cereals. Low-sodium cereals. Corn or whole wheat flour tortillas. Whole grain cornbread. Whole grain crackers. Low-sodium crackers.  Vegetables  Fresh or frozen vegetables (raw, steamed, roasted, or grilled). Low-sodium or reduced-sodium tomato and vegetable juices. Low-sodium or reduced-sodium tomato sauce and paste. Low-sodium or reduced-sodium canned vegetables.   Fruits  All fresh, canned (in natural juice), or frozen fruits.  Protein Products   All fish and seafood.  Dried beans, peas, or lentils. Unsalted nuts and seeds. Unsalted canned beans.  Dairy  Low-fat dairy products, such as skim or 1% milk, 2% or reduced-fat cheeses, low-fat ricotta or cottage cheese, or plain low-fat yogurt. Low-sodium or reduced-sodium cheeses.  Fats and Oils  Tub margarines without trans fats. Light or  reduced-fat mayonnaise and salad dressings (reduced sodium). Avocado. Safflower, olive, or canola oils. Natural peanut or almond butter.  Other  Unsalted popcorn and pretzels. The items listed above may not be a complete list of recommended foods or beverages. Contact your dietitian for more options.  +++++++++++++++++++++++++++++++++++++++++++  WHAT FOODS ARE NOT RECOMMENDED?  Grains/ White flour or wheat flour  White bread. White pasta. White rice. Refined cornbread. Bagels and croissants. Crackers that contain trans fat.  Vegetables  Creamed or fried vegetables. Vegetables in a . Regular canned vegetables. Regular canned tomato sauce and paste. Regular tomato and vegetable juices.  Fruits  Dried fruits. Canned fruit in light or heavy syrup. Fruit juice.  Meat and Other Protein Products  Meat in general - RED mwaet & White meat.  Fatty cuts of meat. Ribs, chicken wings, bacon, sausage, bologna, salami, chitterlings, fatback, hot dogs, bratwurst, and packaged luncheon meats.  Dairy  Whole or 2% milk, cream, half-and-half, and cream cheese. Whole-fat or sweetened yogurt. Full-fat cheeses or blue cheese. Nondairy creamers and whipped toppings. Processed cheese, cheese spreads, or  cheese curds.  Condiments  Onion and garlic salt, seasoned salt, table salt, and sea salt. Canned and packaged gravies. Worcestershire sauce. Tartar sauce. Barbecue sauce. Teriyaki sauce. Soy sauce, including reduced sodium. Steak sauce. Fish sauce. Oyster sauce. Cocktail sauce. Horseradish. Ketchup and mustard. Meat flavorings and tenderizers. Bouillon cubes. Hot sauce. Tabasco sauce. Marinades. Taco seasonings. Relishes.  Fats and Oils Butter, stick margarine, lard, shortening and bacon fat. Coconut, palm kernel, or palm oils. Regular salad dressings.  Pickles and olives. Salted popcorn and pretzels.  The items listed above may not be a complete list of foods and beverages to avoid.   Preventive  Care for Adults  A healthy lifestyle and preventive care can promote health and wellness. Preventive health guidelines for women include the following key practices.  A routine yearly physical is a good way to check with your health care provider about your health and preventive screening. It is a chance to share any concerns and updates on your health and to receive a thorough exam.  Visit your dentist for a routine exam and preventive care every 6 months. Brush your teeth twice a day and floss once a day. Good oral hygiene prevents tooth decay and gum disease.  The frequency of eye exams is based on your age, health, family medical history, use of contact lenses, and other factors. Follow your health care provider's recommendations for frequency of eye exams.  Eat a healthy diet. Foods like vegetables, fruits, whole grains, low-fat dairy products, and lean protein foods contain the nutrients you need without too many calories. Decrease your intake of foods high in solid fats, added sugars, and salt. Eat the right amount of calories for you.Get information about a proper diet from your health care provider, if necessary.  Regular physical exercise is one of the most important things you can do for your health. Most adults should get at least 150 minutes of moderate-intensity exercise (any activity that increases your heart rate and causes you to sweat) each week. In addition, most adults need muscle-strengthening exercises on 2 or more days a week.  Maintain a healthy weight. The body mass index (BMI) is a screening tool to identify possible weight problems. It provides an estimate of body fat based on height and weight. Your health care provider can find your BMI and can help you achieve or maintain a healthy weight.For adults 20 years and older:  A BMI below 18.5 is considered underweight.  A BMI of 18.5 to 24.9 is normal.  A BMI of 25 to 29.9 is considered overweight.  A BMI of 30 and  above is considered obese.  Maintain normal blood lipids and cholesterol levels by exercising and minimizing your intake of saturated fat. Eat a balanced diet with plenty of fruit and vegetables. If your lipid or cholesterol levels are high, you are over 50, or you are at high risk for heart disease, you may need your cholesterol levels checked more frequently.Ongoing high lipid and cholesterol levels should be treated with medicines if diet and exercise are not working.  If you smoke, find out from your health care provider how to quit. If you do not use tobacco, do not start.  Lung cancer screening is recommended for adults aged 56-80 years who are at high risk for developing lung cancer because of a history of smoking. A yearly low-dose CT scan of the lungs is recommended for people who have at least a 30-pack-year history of smoking and are a current smoker or  have quit within the past 15 years. A pack year of smoking is smoking an average of 1 pack of cigarettes a day for 1 year (for example: 1 pack a day for 30 years or 2 packs a day for 15 years). Yearly screening should continue until the smoker has stopped smoking for at least 15 years. Yearly screening should be stopped for people who develop a health problem that would prevent them from having lung cancer treatment.  Avoid use of street drugs. Do not share needles with anyone. Ask for help if you need support or instructions about stopping the use of drugs.  High blood pressure causes heart disease and increases the risk of stroke.  Ongoing high blood pressure should be treated with medicines if weight loss and exercise do not work.  If you are 52-67 years old, ask your health care provider if you should take aspirin to prevent strokes.  Diabetes screening involves taking a blood sample to check your fasting blood sugar level. This should be done once every 3 years, after age 9, if you are within normal weight and without risk factors for  diabetes. Testing should be considered at a younger age or be carried out more frequently if you are overweight and have at least 1 risk factor for diabetes.  Breast cancer screening is essential preventive care for women. You should practice "breast self-awareness." This means understanding the normal appearance and feel of your breasts and may include breast self-examination. Any changes detected, no matter how small, should be reported to a health care provider. Women in their 45s and 30s should have a clinical breast exam (CBE) by a health care provider as part of a regular health exam every 1 to 3 years. After age 76, women should have a CBE every year. Starting at age 54, women should consider having a mammogram (breast X-ray test) every year. Women who have a family history of breast cancer should talk to their health care provider about genetic screening. Women at a high risk of breast cancer should talk to their health care providers about having an MRI and a mammogram every year.  Breast cancer gene (BRCA)-related cancer risk assessment is recommended for women who have family members with BRCA-related cancers. BRCA-related cancers include breast, ovarian, tubal, and peritoneal cancers. Having family members with these cancers may be associated with an increased risk for harmful changes (mutations) in the breast cancer genes BRCA1 and BRCA2. Results of the assessment will determine the need for genetic counseling and BRCA1 and BRCA2 testing.  Routine pelvic exams to screen for cancer are no longer recommended for nonpregnant women who are considered low risk for cancer of the pelvic organs (ovaries, uterus, and vagina) and who do not have symptoms. Ask your health care provider if a screening pelvic exam is right for you.  If you have had past treatment for cervical cancer or a condition that could lead to cancer, you need Pap tests and screening for cancer for at least 20 years after your  treatment. If Pap tests have been discontinued, your risk factors (such as having a new sexual partner) need to be reassessed to determine if screening should be resumed. Some women have medical problems that increase the chance of getting cervical cancer. In these cases, your health care provider may recommend more frequent screening and Pap tests.    Colorectal cancer can be detected and often prevented. Most routine colorectal cancer screening begins at the age of 73 years and  continues through age 75 years. However, your health care provider may recommend screening at an earlier age if you have risk factors for colon cancer. On a yearly basis, your health care provider may provide home test kits to check for hidden blood in the stool. Use of a small camera at the end of a tube, to directly examine the colon (sigmoidoscopy or colonoscopy), can detect the earliest forms of colorectal cancer. Talk to your health care provider about this at age 50, when routine screening begins. Direct exam of the colon should be repeated every 5-10 years through age 75 years, unless early forms of pre-cancerous polyps or small growths are found.  Osteoporosis is a disease in which the bones lose minerals and strength with aging. This can result in serious bone fractures or breaks. The risk of osteoporosis can be identified using a bone density scan. Women ages 65 years and over and women at risk for fractures or osteoporosis should discuss screening with their health care providers. Ask your health care provider whether you should take a calcium supplement or vitamin D to reduce the rate of osteoporosis.  Menopause can be associated with physical symptoms and risks. Hormone replacement therapy is available to decrease symptoms and risks. You should talk to your health care provider about whether hormone replacement therapy is right for you.  Use sunscreen. Apply sunscreen liberally and repeatedly throughout the day. You  should seek shade when your shadow is shorter than you. Protect yourself by wearing long sleeves, pants, a wide-brimmed hat, and sunglasses year round, whenever you are outdoors.  Once a month, do a whole body skin exam, using a mirror to look at the skin on your back. Tell your health care provider of new moles, moles that have irregular borders, moles that are larger than a pencil eraser, or moles that have changed in shape or color.  Stay current with required vaccines (immunizations).  Influenza vaccine. All adults should be immunized every year.  Tetanus, diphtheria, and acellular pertussis (Td, Tdap) vaccine. Pregnant women should receive 1 dose of Tdap vaccine during each pregnancy. The dose should be obtained regardless of the length of time since the last dose. Immunization is preferred during the 27th-36th week of gestation. An adult who has not previously received Tdap or who does not know her vaccine status should receive 1 dose of Tdap. This initial dose should be followed by tetanus and diphtheria toxoids (Td) booster doses every 10 years. Adults with an unknown or incomplete history of completing a 3-dose immunization series with Td-containing vaccines should begin or complete a primary immunization series including a Tdap dose. Adults should receive a Td booster every 10 years.    Zoster vaccine. One dose is recommended for adults aged 60 years or older unless certain conditions are present.    Pneumococcal 13-valent conjugate (PCV13) vaccine. When indicated, a person who is uncertain of her immunization history and has no record of immunization should receive the PCV13 vaccine. An adult aged 19 years or older who has certain medical conditions and has not been previously immunized should receive 1 dose of PCV13 vaccine. This PCV13 should be followed with a dose of pneumococcal polysaccharide (PPSV23) vaccine. The PPSV23 vaccine dose should be obtained at least 8 weeks after the dose  of PCV13 vaccine. An adult aged 19 years or older who has certain medical conditions and previously received 1 or more doses of PPSV23 vaccine should receive 1 dose of PCV13. The PCV13 vaccine dose should   be obtained 1 or more years after the last PPSV23 vaccine dose.    Pneumococcal polysaccharide (PPSV23) vaccine. When PCV13 is also indicated, PCV13 should be obtained first. All adults aged 65 years and older should be immunized. An adult younger than age 65 years who has certain medical conditions should be immunized. Any person who resides in a nursing home or long-term care facility should be immunized. An adult smoker should be immunized. People with an immunocompromised condition and certain other conditions should receive both PCV13 and PPSV23 vaccines. People with human immunodeficiency virus (HIV) infection should be immunized as soon as possible after diagnosis. Immunization during chemotherapy or radiation therapy should be avoided. Routine use of PPSV23 vaccine is not recommended for American Indians, [place] Natives, or people younger than 65 years unless there are medical conditions that require PPSV23 vaccine. When indicated, people who have unknown immunization and have no record of immunization should receive PPSV23 vaccine. One-time revaccination 5 years after the first dose of PPSV23 is recommended for people aged 19-64 years who have chronic kidney failure, nephrotic syndrome, asplenia, or immunocompromised conditions. People who received 1-2 doses of PPSV23 before age 65 years should receive another dose of PPSV23 vaccine at age 65 years or later if at least 5 years have passed since the previous dose. Doses of PPSV23 are not needed for people immunized with PPSV23 at or after age 65 years.   Preventive Services / Frequency  Ages 65 years and over  Blood pressure check.  Lipid and cholesterol check.  Lung cancer screening. / Every year if you are aged 55-80 years and have a  30-pack-year history of smoking and currently smoke or have quit within the past 15 years. Yearly screening is stopped once you have quit smoking for at least 15 years or develop a health problem that would prevent you from having lung cancer treatment.  Clinical breast exam.** / Every year after age 40 years.  BRCA-related cancer risk assessment.** / For women who have family members with a BRCA-related cancer (breast, ovarian, tubal, or peritoneal cancers).  Mammogram.** / Every year beginning at age 40 years and continuing for as long as you are in good health. Consult with your health care provider.  Pap test.** / Every 3 years starting at age 30 years through age 65 or 70 years with 3 consecutive normal Pap tests. Testing can be stopped between 65 and 70 years with 3 consecutive normal Pap tests and no abnormal Pap or HPV tests in the past 10 years.  Fecal occult blood test (FOBT) of stool. / Every year beginning at age 50 years and continuing until age 75 years. You may not need to do this test if you get a colonoscopy every 10 years.  Flexible sigmoidoscopy or colonoscopy.** / Every 5 years for a flexible sigmoidoscopy or every 10 years for a colonoscopy beginning at age 50 years and continuing until age 75 years.  Hepatitis C blood test.** / For all people born from 1945 through 1965 and any individual with known risks for hepatitis C.  Osteoporosis screening.** / A one-time screening for women ages 65 years and over and women at risk for fractures or osteoporosis.  Skin self-exam. / Monthly.  Influenza vaccine. / Every year.  Tetanus, diphtheria, and acellular pertussis (Tdap/Td) vaccine.** / 1 dose of Td every 10 years.  Zoster vaccine.** / 1 dose for adults aged 60 years or older.  Pneumococcal 13-valent conjugate (PCV13) vaccine.** / Consult your health care provider.    Pneumococcal polysaccharide (PPSV23) vaccine.** / 1 dose for all adults aged 68 years and older. Screening  for abdominal aortic aneurysm (AAA)  by ultrasound is recommended for people who have history of high blood pressure or who are current or former smokers.

## 2016-03-03 NOTE — Progress Notes (Signed)
Patient ID: Sarah Hartman, female   DOB: [birth year], 65 y.o.   MRN: [id number]   [facility] ADOL[facility] MEDIC[facility]                    Unk Pinto, M.D.    Uvaldo Bristle. Silverio Lay, P.A.-C      Starlyn Skeans, P.A.-C   [facility]                [address] [place], [place] SSN-[id number] Telephone [phone] Telefax [phone] _________________________________  Annual Screening/Preventative Visit And Comprehensive Evaluation &  Examination  This very nice 65 y.o. DWF presents for a Wellness/Preventative Visit & comprehensive evaluation and management of multiple medical co-morbidities.  Patient has been followed for hx/o labile HTN, Prediabetes, Hyperlipidemia, Hypothyroidism and Vitamin D Deficiency.    Labile HTN predates since 2007 & patient has been monitored expectently. Patient's BP has been controlled at home and patient denies any cardiac symptoms as chest pain, palpitations, shortness of breath, dizziness or ankle swelling. Today's BP: 138/84 mmHg    Patient's hyperlipidemia is controlled with diet and medications. Patient denies myalgias or other medication SE's. Last lipids were not at goal with T Chol 189, HDL 61, TG 72 and elevated LDL 114 in Apr 2016.   Patient has prediabetes predating since Jan 2012 with A1c 5.7% and then back down to normal 5.6% in 2014 and patient denies reactive hypoglycemic symptoms, visual blurring, diabetic polys, or paresthesias. Last A1c was still 5.6% in Apr 2016.    Patient was initiated on Thyroid replacement in 2001. Finally, patient has history of Vitamin D Deficiency and last Vitamin D was 60 in Apr 2016.    Medication Sig  . OCUVITE Take 1 tablet by mouth daily.  Marland Kitchen CALCIUM 600 + D  Take 600 mg by mouth daily.   Marland Kitchen levothyroxine  50 MCG tablet TAKE ONE TABLET BY MOUTH ONCE DAILY BEFORE  BREAKFAST  . Red Yeast Rice Extract  Take 1,200 mg by mouth daily.   Allergies  Allergen  Reactions  . Codeine Nausea And Vomiting and Other (See Comments)    Migraine   Past Medical History  Diagnosis Date  . Macular degeneration of left eye   . Adenomatous colon polyp   . GERD (gastroesophageal reflux disease)   . Thyroid disease     hypothyroid  . IBS (irritable bowel syndrome)   . Vitamin D deficiency   . RLS (restless legs syndrome)   . Labile hypertension    Health Maintenance  Topic Date Due  . DEXA SCAN  01/31/2015  . INFLUENZA VACCINE  06/02/2016  . MAMMOGRAM  01/14/2017  . COLONOSCOPY  08/30/2017  . PNA vac Low Risk Adult (2 of 2 - PPSV23) 11/09/2017  . TETANUS/TDAP  10/18/2018  . ZOSTAVAX  Completed  . Hepatitis C Screening  Completed   Immunization History  Administered Date(s) Administered  . Pneumococcal Polysaccharide-23 11/09/2012  . Tdap 10/18/2008   Past Surgical History  Procedure Laterality Date  . Colonoscopy    . Tubal ligation    . Tendon reconstruction Right 04/27/2013    Procedure: RIGHT ELBOW DEBRIDE/REPAIR/EXPLORE - LATERAL HUMERAL EPICONDYLE;  Surgeon: Ninetta Lights, MD;  Location: [facility];  Service: Orthopedics;  Laterality: Right;   Family History  Problem Relation Age of Onset  . Alzheimer's disease Mother   . Hypertension  Father   . Cerebral aneurysm Father    Social History  Substance Use Topics  . Smoking status: Former Smoker    Quit date: 04/24/1981  . Smokeless tobacco: Never Used  . Alcohol Use: 1.8 oz/week    3 Glasses of wine per week     Comment: occ    ROS Constitutional: Denies fever, chills, weight loss/gain, headaches, insomnia,  night sweats, and change in appetite. Does c/o fatigue. Eyes: Denies redness, blurred vision, diplopia, discharge, itchy, watery eyes.  ENT: Denies discharge, congestion, post nasal drip, epistaxis, sore throat, earache, hearing loss, dental pain, Tinnitus, Vertigo, Sinus pain, snoring.  Cardio: Denies chest pain, palpitations, irregular heartbeat, syncope,  dyspnea, diaphoresis, orthopnea, PND, claudication, edema Respiratory: denies cough, dyspnea, DOE, pleurisy, hoarseness, laryngitis, wheezing.  Gastrointestinal: Denies dysphagia, heartburn, reflux, water brash, pain, cramps, nausea, vomiting, bloating, diarrhea, constipation, hematemesis, melena, hematochezia, jaundice, hemorrhoids Genitourinary: Denies dysuria, frequency, urgency, nocturia, hesitancy, discharge, hematuria, flank pain Breast: Breast lumps, nipple discharge, bleeding.  Musculoskeletal: Denies arthralgia, myalgia, stiffness, Jt. Swelling, pain, limp, and strain/sprain. Denies falls. Skin: Denies puritis, rash, hives, warts, acne, eczema, changing in skin lesion Neuro: No weakness, tremor, incoordination, spasms, paresthesia, pain Psychiatric: Denies confusion, memory loss, sensory loss. Denies Depression. Endocrine: Denies change in weight, skin, hair change, nocturia, and paresthesia, diabetic polys, visual blurring, hyper / hypo glycemic episodes.  Heme/Lymph: No excessive bleeding, bruising, enlarged lymph nodes.  Physical Exam  BP 138/84 mmHg  Pulse 76  Temp(Src) 97.8 F (36.6 C)  Resp 16  Ht 5\' 3"  (1.6 m)  Wt 142 lb 9.6 oz (64.683 kg)  BMI 25.27 kg/m2  General Appearance: Well nourished and in no apparent distress. Eyes: PERRLA, EOMs, conjunctiva no swelling or erythema, normal fundi and vessels. Sinuses: No frontal/maxillary tenderness ENT/Mouth: EACs patent / TMs  nl. Nares clear without erythema, swelling, mucoid exudates. Oral hygiene is good. No erythema, swelling, or exudate. Tongue normal, non-obstructing. Tonsils not swollen or erythematous. Hearing normal.  Neck: Supple, thyroid normal. No bruits, nodes or JVD. Respiratory: Respiratory effort normal.  BS equal and clear bilateral without rales, rhonci, wheezing or stridor. Cardio: Heart sounds are normal with regular rate and rhythm and no murmurs, rubs or gallops. Peripheral pulses are normal and equal  bilaterally without edema. No aortic or femoral bruits. Chest: symmetric with normal excursions and percussion. Breasts: Done 1 month ago by Dr Nori Riis & also MGM was Negative. Abdomen: Flat, soft, with bowel sounds. Nontender, no guarding, rebound, hernias, masses, or organomegaly.  Lymphatics: Non tender without lymphadenopathy.  Genitourinary: Pap 1 month ago by Dr Nori Riis. Musculoskeletal: Full ROM all peripheral extremities, joint stability, 5/5 strength, and normal gait. Skin: Warm and dry without rashes, lesions, cyanosis, clubbing or  ecchymosis.  Neuro: Cranial nerves intact, reflexes equal bilaterally. Normal muscle tone, no cerebellar symptoms. Sensation intact.  Pysch: Alert and oriented X 3, normal affect, Insight and Judgment appropriate.   Assessment and Plan  1. Labile hypertension  - Microalbumin / creatinine urine ratio - EKG 12-Lead - [place], RETROPERITNL ABD,  LTD - TSH  2. Mixed hyperlipidemia  - Lipid panel - TSH  3. Prediabetes  - Hemoglobin A1c - Insulin, random  4. Vitamin D deficiency  - VITAMIN D 25 Hydroxy   5. Hypothyroidism   6. Gastroesophageal reflux disease   7. IBS (irritable bowel syndrome)   8. Screening for rectal cancer  - POC Hemoccult Bld/Stl   9. Screening for AAA (aortic abdominal aneurysm)   10. Screening for ischemic  heart disease   11. Medication management  - Urinalysis, Routine w reflex microscopic  - CBC with Differential/Platelet - BASIC METABOLIC PANEL WITH GFR - Hepatic function panel - Magnesium   Continue prudent diet as discussed, weight control, BP monitoring, regular exercise, and medications. Discussed med's effects and SE's. Screening labs and tests as requested with regular follow-up as recommended. Over 40 minutes of exam, counseling, chart review and high complex critical decision making was performed.

## 2016-03-04 LAB — URINALYSIS, ROUTINE W REFLEX MICROSCOPIC
BILIRUBIN URINE: NEGATIVE
Glucose, UA: NEGATIVE
HGB URINE DIPSTICK: NEGATIVE
KETONES UR: NEGATIVE
Leukocytes, UA: NEGATIVE
NITRITE: NEGATIVE
PROTEIN: NEGATIVE
SPECIFIC GRAVITY, URINE: 1.02 (ref 1.001–1.035)
pH: 6 (ref 5.0–8.0)

## 2016-03-04 LAB — MICROALBUMIN / CREATININE URINE RATIO
Creatinine, Urine: 131 mg/dL (ref 20–320)
MICROALB UR: 0.4 mg/dL
MICROALB/CREAT RATIO: 3 ug/mg{creat} (ref <30)

## 2016-03-04 LAB — INSULIN, RANDOM: INSULIN: 4.1 u[IU]/mL (ref 2.0–19.6)

## 2016-03-04 LAB — VITAMIN D 25 HYDROXY (VIT D DEFICIENCY, FRACTURES): Vit D, 25-Hydroxy: 46 ng/mL (ref 30–100)

## 2016-04-16 ENCOUNTER — Ambulatory Visit (INDEPENDENT_AMBULATORY_CARE_PROVIDER_SITE_OTHER): Payer: PPO | Admitting: Podiatry

## 2016-04-16 ENCOUNTER — Encounter: Payer: Self-pay | Admitting: Podiatry

## 2016-04-16 ENCOUNTER — Ambulatory Visit (INDEPENDENT_AMBULATORY_CARE_PROVIDER_SITE_OTHER): Payer: PPO

## 2016-04-16 VITALS — BP 145/84 | HR 64 | Resp 16

## 2016-04-16 DIAGNOSIS — M722 Plantar fascial fibromatosis: Secondary | ICD-10-CM

## 2016-04-16 MED ORDER — METHYLPREDNISOLONE 4 MG PO TBPK: ORAL_TABLET | ORAL | Status: DC

## 2016-04-16 MED ORDER — MELOXICAM 15 MG PO TABS: 15.0000 mg | ORAL_TABLET | Freq: Every day | ORAL | Status: DC

## 2016-04-16 NOTE — Progress Notes (Signed)
   Subjective:    Patient ID: Sarah Hartman, female    DOB: 1951/05/08, 65 y.o.   MRN: [id number]  HPI: She presents today with chief complaint of bilateral heel pain she states it has been present for several months left seems to be worse than the right mornings are particularly bad and after she's been sitting for a while. She also states that she has tried ibuprofen which helped some.    Review of Systems  All other systems reviewed and are negative.      Objective:   Physical Exam: Vital signs are stable she is alert and oriented 3. Pulses are palpable. Neurologic sensorium is intact per Semmes-Weinstein monofilament. Deep tendon reflexes are intact bilaterally muscle strength was 5 over 5 dorsiflexion plantar flexors and inverters and everters all intrinsic musculature is intact. Orthopedic evaluation and straight spell palpation medial calcaneal tubercles bilateral. Plantar distally oriented calcaneal heel spurs identified on radiographs today with soft tissue increase in density of the plantar fascia calcaneal insertion site. This is consistent with plantar fasciitis. Cutaneous evaluation and straight supple well-hydrated cutis no erythema edema cellulitis drainage or odor.        Assessment & Plan:  Plantar fasciitis bilateral.  Plan: Discussed etiology pathology conservative versus surgical therapies. At this point a placed her in a plantar fascia brace and the night splint. We also injected her bilateral heels today with Kenalog and local anesthetic. Starting on a Medrol Dosepak to be followed by meloxicam. And I will follow-up with her in 1 month

## 2016-04-16 NOTE — Patient Instructions (Signed)

## 2016-04-27 DIAGNOSIS — N958 Other specified menopausal and perimenopausal disorders: Secondary | ICD-10-CM | POA: Diagnosis not present

## 2016-04-27 DIAGNOSIS — M8588 Other specified disorders of bone density and structure, other site: Secondary | ICD-10-CM | POA: Diagnosis not present

## 2016-04-27 LAB — HM DEXA SCAN

## 2016-04-28 ENCOUNTER — Other Ambulatory Visit: Payer: Self-pay | Admitting: Internal Medicine

## 2016-04-29 ENCOUNTER — Other Ambulatory Visit: Payer: Self-pay | Admitting: *Deleted

## 2016-04-29 MED ORDER — LEVOTHYROXINE SODIUM 50 MCG PO TABS: ORAL_TABLET | ORAL | Status: DC

## 2016-05-19 ENCOUNTER — Encounter: Payer: Self-pay | Admitting: Podiatry

## 2016-05-19 ENCOUNTER — Ambulatory Visit (INDEPENDENT_AMBULATORY_CARE_PROVIDER_SITE_OTHER): Payer: PPO | Admitting: Podiatry

## 2016-05-19 DIAGNOSIS — M722 Plantar fascial fibromatosis: Secondary | ICD-10-CM

## 2016-05-19 MED ORDER — MELOXICAM 15 MG PO TABS: 15.0000 mg | ORAL_TABLET | Freq: Every day | ORAL | Status: DC

## 2016-05-19 NOTE — Progress Notes (Signed)
She presents today for follow-up of her plantar fasciitis. She states that she is some better and she cannot stand wearing the night splint. But she wears the splints regularly. She has not taken meloxicam because it was never distributed to her from her pharmacy.  Objective: Vital signs are stable she is alert and oriented 3. I looked back on the pharmacy orders today making sure that we did order that and it was ordered along with her prednisone which she has already taken in full. She has tenderness on palpation medially continue typical bilateral pulses are palpable without Pain.  Assessment: Residual plantar fasciitis bilateral.  Plan: I encouraged her to take up the meloxicam which I represcribed today I also reinjected the bilateral heels and suggested that she continue all conservative therapies including the plantar fascia braces. Follow up with her in 1 month.

## 2016-06-03 ENCOUNTER — Ambulatory Visit (INDEPENDENT_AMBULATORY_CARE_PROVIDER_SITE_OTHER): Payer: PPO | Admitting: Psychiatry

## 2016-06-03 ENCOUNTER — Encounter (HOSPITAL_COMMUNITY): Payer: Self-pay | Admitting: Psychiatry

## 2016-06-03 DIAGNOSIS — F329 Major depressive disorder, single episode, unspecified: Secondary | ICD-10-CM

## 2016-06-03 DIAGNOSIS — F32A Depression, unspecified: Secondary | ICD-10-CM
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
## 2016-06-17 ENCOUNTER — Other Ambulatory Visit (INDEPENDENT_AMBULATORY_CARE_PROVIDER_SITE_OTHER): Payer: PPO

## 2016-06-17 DIAGNOSIS — Z0001 Encounter for general adult medical examination with abnormal findings: Secondary | ICD-10-CM

## 2016-06-17 DIAGNOSIS — Z1212 Encounter for screening for malignant neoplasm of rectum: Secondary | ICD-10-CM

## 2016-06-17 LAB — POC HEMOCCULT BLD/STL (HOME/3-CARD/SCREEN)
Card #3 Fecal Occult Blood, POC: NEGATIVE
FECAL OCCULT BLD: NEGATIVE
FECAL OCCULT BLD: NEGATIVE

## 2016-06-23 ENCOUNTER — Encounter (HOSPITAL_COMMUNITY): Payer: Self-pay | Admitting: Psychiatry

## 2016-06-23 ENCOUNTER — Ambulatory Visit (INDEPENDENT_AMBULATORY_CARE_PROVIDER_SITE_OTHER): Payer: PPO | Admitting: Psychiatry

## 2016-06-23 DIAGNOSIS — F329 Major depressive disorder, single episode, unspecified: Secondary | ICD-10-CM

## 2016-06-23 DIAGNOSIS — F32A Depression, unspecified: Secondary | ICD-10-CM

## 2016-06-23 NOTE — Progress Notes (Signed)
   THERAPIST PROGRESS NOTE  Session Time: Tuesday 06/23/2016 8:15 AM - 9:10 AM  Participation Level: Active  Behavioral Response: CasualAlertAnxious and Depressed  Type of Therapy: Individual Therapy  Treatment Goals addressed: Learn and implement strategies to overcome depression and anxiety  Interventions: Supportive  Summary: Sarah Hartman is a 65 y.o. female who presents with symptoms of depression and anxiety that have been present for the past few months triggered by the breakup with her fiance. They were together for 4 years. She reports initially experiencing depression when she was 65 years old and attempted suicide while pregnant by pill overdose when her boyfriend at that time left her. Patient reports she was hospital lives and prescribed an antidepressant. She also participated in outpatient therapy briefly. Her current symptoms include depressed mood, anxiety, feelings of worthlessness, insomnia, irritability, and excessive worrying.  Patient reports little to no change in symptoms since last session. She continues to experience depressed mood, anxiety, irritability, and excessive worrying. She reports seeing fiance this past weekend and expresses disappointment about their interaction. She reports deciding that they can no longer be friends. She also reports recent conflict with her daughter. Patient continues to ruminate about her patterns in relationships. She has tried to maintain positive self-care and recently obtained a part-time job.  Suicidal/Homicidal: No   Therapist Response: Reviewed symptoms, facilitated expression of feelings, explore patient's patterns in relationships, discussed rationale for journaling, assisted patient identify ways to improve assertiveness and empathic skills in communication with her daughter.  Plan: Return again in 2-3 weeks.  Diagnosis: Axis I: Depressive disorder    Axis II: Deferred    Oluwafemi Villella, LCSW 06/23/2016

## 2016-06-30 ENCOUNTER — Ambulatory Visit: Payer: PPO | Admitting: Podiatry

## 2016-07-01 ENCOUNTER — Ambulatory Visit (INDEPENDENT_AMBULATORY_CARE_PROVIDER_SITE_OTHER): Payer: PPO | Admitting: Psychiatry

## 2016-07-01 ENCOUNTER — Encounter (HOSPITAL_COMMUNITY): Payer: Self-pay | Admitting: Psychiatry

## 2016-07-01 DIAGNOSIS — F329 Major depressive disorder, single episode, unspecified: Secondary | ICD-10-CM | POA: Diagnosis not present

## 2016-07-01 DIAGNOSIS — F32A Depression, unspecified: Secondary | ICD-10-CM

## 2016-07-01 NOTE — Progress Notes (Signed)
   THERAPIST PROGRESS NOTE  Session Time: Wednesday 07/01/2016 3:05 PM - 4:00 PM  Participation Level: Active  Behavioral Response: CasualAlertAnxious and Depressed  Type of Therapy: Individual Therapy  Treatment Goals addressed:  1. Identify and replace thoughts and beliefs that support depression and anxiety.      2. Learn and implement skills to resolve interpersonal problems.        Interventions: Supportive  Summary: Sarah Hartman is a 65 y.o. female who presents with symptoms of depression and anxiety that have been present for the past few months triggered by the breakup with her fiance. They were together for 4 years. She reports initially experiencing depression when she was 65 years old and attempted suicide while pregnant by pill overdose when her boyfriend at that time left her. Patient reports she was hospital lives and prescribed an antidepressant. She also participated in outpatient therapy briefly. Her current symptoms include depressed mood, anxiety, feelings of worthlessness, insomnia, irritability, and excessive worrying.  Patient reports little to no change in symptoms since last session. She continues to experience depressed mood, anxiety, irritability, and excessive worrying. She is pleased with her successful efforts in improving her communication skills in the relationship with her daughter. She reports having a productive conversation with daughter and being able to express her feelings and concerns in an assertive manner. She continues to ruminate about her patterns in relationships and blames self for dissolution of various relationships. Her statements in session reflect judgmental and critical thought patterns.  She has maintained positive self-care and has continued to work. 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 She reports having no friendships due to trust issues.  Suicidal/Homicidal: No    Therapist Response: Reviewed symptoms, facilitated expression of feelings, praised and reinforced patient's use of assertiveness skills in the relationship with her daughter, assisted patient identify the effects of use of assertiveness skills on her mood and her interaction with others, assisted patient identify strengths and supports, developed treatment plan.  Plan: Return again in 2-3 weeks.  Diagnosis: Axis I: Depressive disorder    Axis II: Deferred    Shantelle Alles, LCSW 07/01/2016

## 2016-07-03 ENCOUNTER — Ambulatory Visit (HOSPITAL_COMMUNITY): Payer: Self-pay | Admitting: Psychiatry

## 2016-07-07 ENCOUNTER — Encounter: Payer: Self-pay | Admitting: Internal Medicine

## 2016-07-07 ENCOUNTER — Ambulatory Visit (INDEPENDENT_AMBULATORY_CARE_PROVIDER_SITE_OTHER): Payer: PPO | Admitting: Internal Medicine

## 2016-07-07 VITALS — BP 124/82 | HR 76 | Temp 97.3°F | Resp 16 | Ht 63.0 in | Wt 138.4 lb

## 2016-07-07 DIAGNOSIS — J209 Acute bronchitis, unspecified: Secondary | ICD-10-CM | POA: Diagnosis not present

## 2016-07-07 MED ORDER — AZITHROMYCIN 250 MG PO TABS: ORAL_TABLET | ORAL | 1 refills | Status: DC

## 2016-07-07 MED ORDER — PREDNISONE 20 MG PO TABS: ORAL_TABLET | ORAL | 0 refills | Status: DC

## 2016-07-07 NOTE — Patient Instructions (Signed)
Mucus Relief 600 mg   Take 2 tablets 3 x/ day after meals   ++++++++++++++++++++ Acute Bronchitis Bronchitis is inflammation of the airways that extend from the windpipe into the lungs (bronchi). The inflammation often causes mucus to develop. This leads to a cough, which is the most common symptom of bronchitis.  In acute bronchitis, the condition usually develops suddenly and goes away over time, usually in a couple weeks. Smoking, allergies, and asthma can make bronchitis worse. Repeated episodes of bronchitis may cause further lung problems.  CAUSES Acute bronchitis is most often caused by the same virus that causes a cold. The virus can spread from person to person (contagious) through coughing, sneezing, and touching contaminated objects. SIGNS AND SYMPTOMS   Cough.   Fever.   Coughing up mucus.   Body aches.   Chest congestion.   Chills.   Shortness of breath.   Sore throat.  DIAGNOSIS  Acute bronchitis is usually diagnosed through a physical exam. Your health care provider will also ask you questions about your medical history. Tests, such as chest X-rays, are sometimes done to rule out other conditions.  TREATMENT  Acute bronchitis usually goes away in a couple weeks. Oftentimes, no medical treatment is necessary. Medicines are sometimes given for relief of fever or cough. Antibiotic medicines are usually not needed but may be prescribed in certain situations. In some cases, an inhaler may be recommended to help reduce shortness of breath and control the cough. A cool mist vaporizer may also be used to help thin bronchial secretions and make it easier to clear the chest.  HOME CARE INSTRUCTIONS  Get plenty of rest.   Drink enough fluids to keep your urine clear or pale yellow (unless you have a medical condition that requires fluid restriction). Increasing fluids may help thin your respiratory secretions (sputum) and reduce chest congestion, and it will prevent  dehydration.   Take medicines only as directed by your health care provider.  If you were prescribed an antibiotic medicine, finish it all even if you start to feel better.  Avoid smoking and secondhand smoke. Exposure to cigarette smoke or irritating chemicals will make bronchitis worse. If you are a smoker, consider using nicotine gum or skin patches to help control withdrawal symptoms. Quitting smoking will help your lungs heal faster.   Reduce the chances of another bout of acute bronchitis by washing your hands frequently, avoiding people with cold symptoms, and trying not to touch your hands to your mouth, nose, or eyes.   Keep all follow-up visits as directed by your health care provider.  SEEK MEDICAL CARE IF: Your symptoms do not improve after 1 week of treatment.  SEEK IMMEDIATE MEDICAL CARE IF:  You develop an increased fever or chills.   You have chest pain.   You have severe shortness of breath.  You have bloody sputum.   You develop dehydration.  You faint or repeatedly feel like you are going to pass out.  You develop repeated vomiting.  You develop a severe headache. MAKE SURE YOU:   Understand these instructions.  Will watch your condition.  Will get help right away if you are not doing well or get worse.

## 2016-07-07 NOTE — Progress Notes (Signed)
  Subjective:    Patient ID: Sarah Hartman, female    DOB: [birth year], 65 y.o.   MRN: [id number]  HPI  This very nice 65 yo DWF presents with a 1 week prodrome of S/T, HA and head congestion and progressive chest congestion. Sputum is yellow. Denies fever, chills, rash, dyspnea.   Medication Sig  . OCUVITE Take 1 tab daily.  Marland Kitchen CALCIUM 600 + D  Take daily.   Marland Kitchen levothyroxine  50 MCG tablet TAKE ONE TAB ONCE DAILY  . meloxicam  15 MG tablet Take 1 tab daily.  . pantoprazole  40 MG tablet Take 1 tab daily. PRN  . Red Yeast Rice Extract  Take 1,200 mg  daily.    Allergies  Allergen Reactions  . Codeine Nausea And Vomiting and Other (See Comments)    Migraine   Past Medical History:  Diagnosis Date  . Adenomatous colon polyp   . GERD (gastroesophageal reflux disease)   . IBS (irritable bowel syndrome)   . Labile hypertension   . Macular degeneration of left eye   . RLS (restless legs syndrome)   . Thyroid disease    hypothyroid  . Vitamin D deficiency    Review of Systems  10 point systems review negative except as above.    Objective:   Physical Exam  BP 124/82   Pulse 76   Temp 97.3 F (36.3 C)   Resp 16   Ht 5\' 3"  (1.6 m)   Wt 138 lb 6.4 oz (62.8 kg)   BMI 24.52 kg/m   Brassy congested cough. No respiratory distress or stridor.  HEENT - Eac's patent. TM's Nl. EOM's full. PERRLA. NasoOroPharynx clear. Neck - supple. Nl Thyroid. Carotids 2+. Chest - Scattered Rales & rhonchi and no wheezes. Cor - Nl HS. RRR w/o sig M.. MS- FROM w/o deformities. Neuro - No obvious Cr N abnormalities. Nl w/o focal abnormalities.    Assessment & Plan:   1. Acute bronchitis, unspecified organism  - predniSONE (DELTASONE) 20 MG tablet; 1 tab 3 x day for 3 days, then 1 tab 2 x day for 3 days, then 1 tab 1 x day for 5 days  Dispense: 20 tablet; Refill: 0  - azithromycin (ZITHROMAX) 250 MG tablet; Take 2 tablets (500 mg) on  Day 1,  followed by 1 tablet (250 mg) once daily on Days 2  through 5.  Dispense: 6 each; Refill: 1  - discussed meds & SE's.

## 2016-07-21 ENCOUNTER — Encounter (HOSPITAL_COMMUNITY): Payer: Self-pay | Admitting: Psychiatry

## 2016-07-21 ENCOUNTER — Ambulatory Visit (INDEPENDENT_AMBULATORY_CARE_PROVIDER_SITE_OTHER): Payer: PPO | Admitting: Psychiatry

## 2016-07-21 DIAGNOSIS — F329 Major depressive disorder, single episode, unspecified: Secondary | ICD-10-CM

## 2016-07-21 DIAGNOSIS — F32A Depression, unspecified: Secondary | ICD-10-CM

## 2016-07-21 NOTE — Progress Notes (Signed)
   THERAPIST PROGRESS NOTE  Session Time:  Tuesday 07/21/2016 10:10 AM - 11:05 AM        Participation Level: Active  Behavioral Response: CasualAlertAnxious and Depressed  Type of Therapy: Individual Therapy   Treatment Goals :    1. Identify and replace thoughts and beliefs that support depression and anxiety.      2. Learn and implement skills to resolve interpersonal problems.  Treatment Goals addressed: 1       Interventions: Supportive ,CBT  Summary: Sarah Hartman is a 65 y.o. female who presents with symptoms of depression and anxiety that have been present for the past few months triggered by the breakup with her fiance. They were together for 4 years. She reports initially experiencing depression when she was 65 years old and attempted suicide while pregnant by pill overdose when her boyfriend at that time left her. Patient reports she was hospital lives and prescribed an antidepressant. She also participated in outpatient therapy briefly. Her current symptoms include depressed mood, anxiety, feelings of worthlessness, insomnia, irritability, and excessive worrying.  Patient reports little to no change in symptoms since last session. She continues to experience depressed mood, anxiety, and excessive worrying. She reports ruminating thoughts about the dissolution of her last relationship and continues to blame self.  Her statements in session continue to reflect judgmental and critical thought patterns.  She calls herself a failure as she states she should have been more patient.She has maintained positive self-care and has continued to work. She continues to coach a soccer team.   Suicidal/Homicidal: No   Therapist Response: Reviewed symptoms, facilitated expression of feelings, explored patient's patterns in relationships with males, discussed other losses in patient's life including childhood issues, discussed effects of previous losses on current loss and thought patterns,  assisted patient identify and challenge exaggerated sense of responsibility,    Plan: Return again in 2-3 weeks.  Diagnosis: Axis I: Depressive disorder    Axis II: Deferred    Erum Cercone, LCSW 07/21/2016

## 2016-08-04 ENCOUNTER — Ambulatory Visit (INDEPENDENT_AMBULATORY_CARE_PROVIDER_SITE_OTHER): Payer: PPO | Admitting: Psychiatry

## 2016-08-04 ENCOUNTER — Encounter (HOSPITAL_COMMUNITY): Payer: Self-pay | Admitting: Psychiatry

## 2016-08-04 DIAGNOSIS — F32A Depression, unspecified: Secondary | ICD-10-CM

## 2016-08-04 DIAGNOSIS — F329 Major depressive disorder, single episode, unspecified: Secondary | ICD-10-CM | POA: Diagnosis not present

## 2016-08-04 NOTE — Progress Notes (Signed)
   THERAPIST PROGRESS NOTE  Session Time:  Tuesday 08/04/2016 10:04 AM -  10:58 AM                  Participation Level: Active  Behavioral Response: CasualAlertAnxious and Depressed  Type of Therapy: Individual Therapy   Treatment Goals :    1. Identify and replace thoughts and beliefs that support depression and anxiety.      2. Learn and implement skills to resolve interpersonal problems.  Treatment Goals addressed: 1       Interventions: Supportive ,CBT  Summary: Sarah Hartman is a 65 y.o. female who presents with symptoms of depression and anxiety that have been present for the past few months triggered by the breakup with her fiance. They were together for 4 years. She reports initially experiencing depression when she was 65 years old and attempted suicide while pregnant by pill overdose when her boyfriend at that time left her. Patient reports she was hospital lives and prescribed an antidepressant. She also participated in outpatient therapy briefly. Her current symptoms include depressed mood, anxiety, feelings of worthlessness, insomnia, irritability, and excessive worrying.  Patient reports improved mood, decreased irritability, decreased worry, and decreased anxiety since last session. She states feeling better since she has been looking at her last relationship differently and says she no longer places all the blame for the ending of the relationship on self. She also reports decreased thoughts about the relationship. 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  She continues to experience frustration regarding relationships in general and complains that she experiences little reciprocity. She continues to experience sadness and anger  regarding the relationship with her mother during childhood.  Suicidal/Homicidal: No   Therapist Response: Reviewed symptoms, facilitated expression of feelings, began to explore interpersonal schemas developed in  childhood in relationship with mother, discussed the effects of interpersonal schemas on self and current functioning, assisted patient identify strengths she exhibited in childhood to identify and challenge negative schema patient had about self in childhood, assisted patient identify supportive people and positive interaction in childhood  Plan: Return again in 2-3 weeks.  Diagnosis: Axis I: Depressive disorder    Axis II: Deferred    Akaash Vandewater, LCSW 08/04/2016

## 2016-09-03 ENCOUNTER — Ambulatory Visit: Payer: Self-pay | Admitting: Internal Medicine

## 2016-09-16 ENCOUNTER — Ambulatory Visit: Payer: Self-pay | Admitting: Internal Medicine

## 2016-10-19 ENCOUNTER — Other Ambulatory Visit: Payer: Self-pay | Admitting: Internal Medicine

## 2016-10-19 MED ORDER — ACYCLOVIR 200 MG PO CAPS: 200.0000 mg | ORAL_CAPSULE | Freq: Three times a day (TID) | ORAL | 0 refills | Status: DC

## 2016-10-19 MED ORDER — LEVOTHYROXINE SODIUM 50 MCG PO TABS: ORAL_TABLET | ORAL | 0 refills | Status: DC

## 2016-10-19 MED ORDER — PANTOPRAZOLE SODIUM 40 MG PO TBEC: DELAYED_RELEASE_TABLET | ORAL | 0 refills | Status: DC

## 2017-01-18 DIAGNOSIS — Z1231 Encounter for screening mammogram for malignant neoplasm of breast: Secondary | ICD-10-CM | POA: Diagnosis not present

## 2017-01-18 LAB — HM MAMMOGRAPHY

## 2017-01-25 ENCOUNTER — Encounter: Payer: Self-pay | Admitting: Internal Medicine

## 2017-01-25 ENCOUNTER — Ambulatory Visit (INDEPENDENT_AMBULATORY_CARE_PROVIDER_SITE_OTHER): Payer: PPO | Admitting: Internal Medicine

## 2017-01-25 VITALS — BP 126/78 | HR 68 | Temp 98.2°F | Resp 16 | Ht 63.0 in | Wt 146.0 lb

## 2017-01-25 DIAGNOSIS — I781 Nevus, non-neoplastic: Secondary | ICD-10-CM | POA: Diagnosis not present

## 2017-01-25 DIAGNOSIS — K219 Gastro-esophageal reflux disease without esophagitis: Secondary | ICD-10-CM

## 2017-01-25 DIAGNOSIS — Z0001 Encounter for general adult medical examination with abnormal findings: Secondary | ICD-10-CM

## 2017-01-25 DIAGNOSIS — K589 Irritable bowel syndrome without diarrhea: Secondary | ICD-10-CM | POA: Diagnosis not present

## 2017-01-25 DIAGNOSIS — E782 Mixed hyperlipidemia: Secondary | ICD-10-CM

## 2017-01-25 DIAGNOSIS — R030 Elevated blood-pressure reading, without diagnosis of hypertension: Secondary | ICD-10-CM

## 2017-01-25 DIAGNOSIS — E559 Vitamin D deficiency, unspecified: Secondary | ICD-10-CM | POA: Diagnosis not present

## 2017-01-25 DIAGNOSIS — R6889 Other general symptoms and signs: Secondary | ICD-10-CM | POA: Diagnosis not present

## 2017-01-25 DIAGNOSIS — D229 Melanocytic nevi, unspecified: Secondary | ICD-10-CM

## 2017-01-25 DIAGNOSIS — E039 Hypothyroidism, unspecified: Secondary | ICD-10-CM | POA: Diagnosis not present

## 2017-01-25 DIAGNOSIS — Z0000 Encounter for general adult medical examination without abnormal findings: Secondary | ICD-10-CM

## 2017-01-25 DIAGNOSIS — H3530 Unspecified macular degeneration: Secondary | ICD-10-CM

## 2017-01-25 DIAGNOSIS — G2581 Restless legs syndrome: Secondary | ICD-10-CM | POA: Diagnosis not present

## 2017-01-25 NOTE — Progress Notes (Signed)
MEDICARE ANNUAL WELLNESS VISIT AND FOLLOW UP Assessment:    1. Spider angioma -discussed that this may be lessened by possible laser treatments -this is a patient request - Ambulatory referral to Vascular Surgery  2. Nevus -she would like to go to dermatology for skin survey - Ambulatory referral to Dermatology  3. Mixed hyperlipidemia -cont red yeast rice -cont diet and exercise -has CPE in 3 months and defers blood work until then.    4. Elevated BP without diagnosis of hypertension -cont diet and exercise -cont dash diet -cont monitoring at home  5. Gastroesophageal reflux disease, esophagitis presence not specified -take protonix prn -avoid trigger foods  6. Irritable bowel syndrome, unspecified type -avoid trigger foods -patient currently without symptoms  7. Hypothyroidism, unspecified type -cont levothyroxine  8. Macular degeneration of left eye -followed by her current employer at [facility] [facility]  9. RLS (restless legs syndrome) -not currently on medications  10. Vitamin D deficiency -cont Vit D  11. Encounter for Medicare annual wellness exam -due next year    Over 30 minutes of exam, counseling, chart review, and critical decision making was performed  Future Appointments Date Time Provider [place]  04/05/2017 9:00 AM Unk Pinto, MD [facility] None     Plan:   During the course of the visit the patient was educated and counseled about appropriate screening and preventive services including:    Pneumococcal vaccine   Influenza vaccine  Prevnar 13  Td vaccine  Screening electrocardiogram  Colorectal cancer screening  Diabetes screening  Glaucoma screening  Nutrition counseling    Subjective:  Sarah Hartman is a 66 y.o. female who presents for Medicare Annual Wellness Visit and 3 month follow up for HTN, hyperlipidemia, prediabetes, and vitamin D Def.   Her blood pressure has been controlled at home, today their BP  is BP: 126/78 She does not workout. She denies chest pain, shortness of breath, dizziness.  She is not on cholesterol medication and denies myalgias. Her cholesterol is at goal. The cholesterol last visit was:   Lab Results  Component Value Date   CHOL 187 03/03/2016   HDL 76 03/03/2016   LDLCALC 93 03/03/2016   TRIG 91 03/03/2016   CHOLHDL 2.5 03/03/2016    Last GFR Lab Results  Component Value Date   GFRNONAA 73 03/03/2016     Lab Results  Component Value Date   GFRAA 84 03/03/2016   Patient is on Vitamin D supplement.   Lab Results  Component Value Date   VD25OH 46 03/03/2016     She reports that she would like to go to see the dermatologist for several different moles.  She would like to go to get a skin survey done.  She have several moles she feels are suspicious.    She also has some spider veins which she would like to have somebody   She reports that on the whole her acid reflux has been doing well.  She does not take the pantoprazole regularly.  She only takes it when she needs it.  She reports that she did have a mild flare yesterday.    Medication Review: Current Outpatient Prescriptions on File Prior to Visit  Medication Sig Dispense Refill  . beta carotene w/minerals (OCUVITE) tablet Take 1 tablet by mouth daily.    . Calcium Carbonate-Vitamin D (CALCIUM 600 + D PO) Take 600 mg by mouth daily.     Marland Kitchen levothyroxine (SYNTHROID, LEVOTHROID) 50 MCG tablet TAKE ONE TABLET BY MOUTH  ONCE DAILY BEFORE  BREAKFAST 30 tablet 0  . pantoprazole (PROTONIX) 40 MG tablet Take 1 tablet daily if needed for heartburn & reflux 30 tablet 0  . Red Yeast Rice Extract (RED YEAST RICE PO) Take 1,200 mg by mouth daily.     No current facility-administered medications on file prior to visit.     Allergies: Allergies  Allergen Reactions  . Codeine Nausea And Vomiting and Other (See Comments)    Migraine    Current Problems (verified) has GERD (gastroesophageal reflux disease);  Macular degeneration of left eye; IBS (irritable bowel syndrome); RLS (restless legs syndrome); Labile hypertension; Vitamin D deficiency; Mixed hyperlipidemia; Prediabetes; Hypothyroidism; Medication management; and Encounter for Medicare annual wellness exam on her problem list.  Screening Tests Immunization History  Administered Date(s) Administered  . Influenza-Unspecified 07/29/2016  . Pneumococcal Polysaccharide-23 11/09/2012  . Tdap 10/18/2008    Preventative care: Last colonoscopy: 2013 DEXA: handled by Dr. Nori Riis Mammogram: 2018  Names of Other Physician/Practitioners you currently use: 1. [facility] here for primary care 2. [facility], eye doctor, last visit 2018 3. Dr. , dentist, last visit 2018 Patient Care Team: Unk Pinto, MD as PCP - General (Internal Medicine) Ninetta Lights, MD as Consulting Physician (Orthopedic Surgery) Irene Shipper, MD as Consulting Physician (Gastroenterology) Festus Aloe, MD as Consulting Physician (Urology) Maisie Fus, MD as Consulting Physician (Obstetrics and Gynecology)  Surgical: She  has a past surgical history that includes Colonoscopy; Tubal ligation; and Tendon reconstruction (Right, 04/27/2013). Family Her family history includes Alzheimer's disease in her mother; Cerebral aneurysm in her father; Hypertension in her father. Social history  She reports that she quit smoking about 35 years ago. She has never used smokeless tobacco. She reports that she drinks about 1.8 oz of alcohol per week . She reports that she does not use drugs.  MEDICARE WELLNESS OBJECTIVES: Physical activity: Current Exercise Habits: Home exercise routine, Type of exercise: treadmill;calisthenics, Time (Minutes): 40, Frequency (Times/Week): 4, Weekly Exercise (Minutes/Week): 160, Intensity: Moderate Cardiac risk factors: Cardiac Risk Factors include: advanced age (>62men, >42  women);hypertension;dyslipidemia Depression/mood screen:   Depression screen [facility] 2/9 01/25/2017  Decreased Interest 0  Down, Depressed, Hopeless 0  PHQ - 2 Score 0    ADLs:  In your present state of health, do you have any difficulty performing the following activities: 01/25/2017 03/03/2016  Hearing? N N  Vision? N N  Difficulty concentrating or making decisions? N N  Walking or climbing stairs? N N  Dressing or bathing? N N  Doing errands, shopping? N N  Preparing Food and eating ? N -  Using the Toilet? N -  In the past six months, have you accidently leaked urine? N -  Do you have problems with loss of bowel control? N -  Managing your Medications? N -  Managing your Finances? N -  Housekeeping or managing your Housekeeping? N -  Some recent data might be hidden     Cognitive Testing  Alert? Yes  Normal Appearance?Yes  Oriented to person? Yes  Place? Yes   Time? Yes  Recall of three objects?  Yes  Can perform simple calculations? Yes  Displays appropriate judgment?Yes  Can read the correct time from a watch face?Yes  EOL planning: Does Patient Have a Medical Advance Directive?: No Would patient like information on creating a medical advance directive?: Yes (MAU/Ambulatory/Procedural Areas - Information given)   Objective:   Today's Vitals   01/25/17 0933  BP: 126/78  Pulse: 68  Resp: 16  Temp: 98.2 F (36.8 C)  TempSrc: Temporal  Weight: 146 lb (66.2 kg)  Height: 5\' 3"  (1.6 m)   Body mass index is 25.86 kg/m.  General appearance: alert, no distress, WD/WN, female HEENT: normocephalic, sclerae anicteric, TMs pearly, nares patent, no discharge or erythema, pharynx normal Oral cavity: MMM, no lesions Neck: supple, no lymphadenopathy, no thyromegaly, no masses Heart: RRR, normal S1, S2, no murmurs Lungs: CTA bilaterally, no wheezes, rhonchi, or rales Abdomen: +bs, soft, non tender, non distended, no masses, no hepatomegaly, no splenomegaly Musculoskeletal:  nontender, no swelling, no obvious deformity Extremities: no edema, no cyanosis, no clubbing Pulses: 2+ symmetric, upper and lower extremities, normal cap refill Neurological: alert, oriented x 3, CN2-12 intact, strength normal upper extremities and lower extremities, sensation normal throughout, DTRs 2+ throughout, no cerebellar signs, gait normal Psychiatric: normal affect, behavior normal, pleasant   Medicare Attestation I have personally reviewed: The patient's medical and social history Their use of alcohol, tobacco or illicit drugs Their current medications and supplements The patient's functional ability including ADLs,fall risks, home safety risks, cognitive, and hearing and visual impairment Diet and physical activities Evidence for depression or mood disorders  The patient's weight, height, BMI, and visual acuity have been recorded in the chart.  I have made referrals, counseling, and provided education to the patient based on review of the above and I have provided the patient with a written personalized care plan for preventive services.     Starlyn Skeans, PA-C   01/25/2017

## 2017-02-08 DIAGNOSIS — Z6826 Body mass index (BMI) 26.0-26.9, adult: Secondary | ICD-10-CM | POA: Diagnosis not present

## 2017-02-08 DIAGNOSIS — Z124 Encounter for screening for malignant neoplasm of cervix: Secondary | ICD-10-CM | POA: Diagnosis not present

## 2017-02-08 DIAGNOSIS — Z01419 Encounter for gynecological examination (general) (routine) without abnormal findings: Secondary | ICD-10-CM | POA: Diagnosis not present

## 2017-02-22 DIAGNOSIS — R87615 Unsatisfactory cytologic smear of cervix: Secondary | ICD-10-CM | POA: Diagnosis not present

## 2017-02-22 DIAGNOSIS — Z124 Encounter for screening for malignant neoplasm of cervix: Secondary | ICD-10-CM | POA: Diagnosis not present

## 2017-02-22 DIAGNOSIS — N952 Postmenopausal atrophic vaginitis: Secondary | ICD-10-CM | POA: Diagnosis not present

## 2017-02-22 LAB — HM PAP SMEAR: HM PAP: NEGATIVE

## 2017-04-05 ENCOUNTER — Encounter: Payer: Self-pay | Admitting: Internal Medicine

## 2017-04-14 ENCOUNTER — Other Ambulatory Visit: Payer: Self-pay | Admitting: Internal Medicine

## 2017-05-25 ENCOUNTER — Other Ambulatory Visit: Payer: Self-pay | Admitting: Internal Medicine

## 2017-06-14 NOTE — Progress Notes (Signed)
[facility] Unk Pinto, M.D.      Uvaldo Bristle. Silverio Lay, P.A.-C [facility]                [address] [place], [place] [address] Telephone [phone] Telefax [phone]  Annual Screening/Preventative Visit & Comprehensive Evaluation &  Examination     This very nice 66 y.o.  DWF presents for a Screening/Preventative Visit & comprehensive evaluation and management of multiple medical co-morbidities.  Patient has been followed for labile HTN, Prediabetes, Hyperlipidemia and Vitamin D Deficiency. Patient also has GERD controlled with current meds and prudent diet.       Patient has hx/o labile HTN monitored since 2007.  Patient's BP has been controlled at home and patient denies any cardiac symptoms as chest pain, palpitations, shortness of breath, dizziness or ankle swelling. Today's BP: 126/84      Patient's hyperlipidemia with elevated LDL Chol 114 in Apr 2016 is controlled with diet. Last lipids were at goal: Lab Results  Component Value Date   CHOL 187 03/03/2016   HDL 76 03/03/2016   LDLCALC 93 03/03/2016   TRIG 91 03/03/2016   CHOLHDL 2.5 03/03/2016      Patient has prediabetes (A1c 5.7% in 2012)  and patient denies reactive hypoglycemic symptoms, visual blurring, diabetic polys, or paresthesias. Last A1c was at goal: Lab Results  Component Value Date   HGBA1C 5.3 03/03/2016      Patient has been on Thyroid Replacement since 2001. Finally, patient has history of Vitamin D Deficiency and last Vitamin D was still low (goal 70-100): Lab Results  Component Value Date   VD25OH 46 03/03/2016   Current Outpatient Prescriptions on File Prior to Visit  Medication Sig  . beta carotene w/minerals (OCUVITE) tablet Take 1 tablet by mouth daily.  . Calcium Carbonate-Vitamin D (CALCIUM 600 + D PO) Take 600 mg by mouth daily.   . Cholecalciferol (VITAMIN D3) 5000 units TABS Take 5,000 Units by mouth  daily.  Marland Kitchen levothyroxine (SYNTHROID, LEVOTHROID) 50 MCG tablet TAKE ONE TABLET BY MOUTH ONCE DAILY BEFORE BREAKFAST  . pantoprazole (PROTONIX) 40 MG tablet TAKE ONE TABLET BY MOUTH ONCE DAILY AS NEEDED FOR HEARTBURN AND REFLUX  . Red Yeast Rice Extract (RED YEAST RICE PO) Take 1,200 mg by mouth daily.   No current facility-administered medications on file prior to visit.    Allergies  Allergen Reactions  . Codeine Nausea And Vomiting and Other (See Comments)    Migraine   Past Medical History:  Diagnosis Date  . Adenomatous colon polyp   . GERD (gastroesophageal reflux disease)   . IBS (irritable bowel syndrome)   . Labile hypertension   . Macular degeneration of left eye   . RLS (restless legs syndrome)   . Thyroid disease    hypothyroid  . Vitamin D deficiency    Health Maintenance  Topic Date Due  . DEXA SCAN  01/31/2015  . INFLUENZA VACCINE  06/02/2017  . COLONOSCOPY  08/30/2017  . PNA vac Low Risk Adult (2 of 2 - PPSV23) 11/09/2017  . TETANUS/TDAP  10/18/2018  . MAMMOGRAM  01/20/2019  . Hepatitis C Screening  Completed   Immunization History  Administered Date(s) Administered  . Influenza-Unspecified 07/29/2016  . Pneumococcal Polysaccharide-23 11/09/2012  . Tdap 10/18/2008   Past Surgical History:  Procedure Laterality Date  . COLONOSCOPY    .  TENDON RECONSTRUCTION Right 04/27/2013   Procedure: RIGHT ELBOW DEBRIDE/REPAIR/EXPLORE - LATERAL HUMERAL EPICONDYLE;  Surgeon: Ninetta Lights, MD;  Location: [facility];  Service: Orthopedics;  Laterality: Right;  . TUBAL LIGATION     Family History  Problem Relation Age of Onset  . Hypertension Father   . Cerebral aneurysm Father   . Alzheimer's disease Mother    Social History  Substance Use Topics  . Smoking status: Former Smoker    Quit date: 04/24/1981  . Smokeless tobacco: Never Used  . Alcohol use 1.8 oz/week    3 Glasses of wine per week     Comment: occ    ROS Constitutional: Denies  fever, chills, weight loss/gain, headaches, insomnia,  night sweats, and change in appetite. Does c/o fatigue. Eyes: Denies redness, blurred vision, diplopia, discharge, itchy, watery eyes.  ENT: Denies discharge, congestion, post nasal drip, epistaxis, sore throat, earache, hearing loss, dental pain, Tinnitus, Vertigo, Sinus pain, snoring.  Cardio: Denies chest pain, palpitations, irregular heartbeat, syncope, dyspnea, diaphoresis, orthopnea, PND, claudication, edema Respiratory: denies cough, dyspnea, DOE, pleurisy, hoarseness, laryngitis, wheezing.  Gastrointestinal: Denies dysphagia, heartburn, reflux, water brash, pain, cramps, nausea, vomiting, bloating, diarrhea, constipation, hematemesis, melena, hematochezia, jaundice, hemorrhoids Genitourinary: Denies dysuria, frequency, urgency, nocturia, hesitancy, discharge, hematuria, flank pain Breast: Breast lumps, nipple discharge, bleeding.  Musculoskeletal: Denies arthralgia, myalgia, stiffness, Jt. Swelling, pain, limp, and strain/sprain. Denies falls. Skin: Denies puritis, rash, hives, warts, acne, eczema, changing in skin lesion Neuro: No weakness, tremor, incoordination, spasms, paresthesia, pain Psychiatric: Denies confusion, memory loss, sensory loss. Denies Depression. Endocrine: Denies change in weight, skin, hair change, nocturia, and paresthesia, diabetic polys, visual blurring, hyper / hypo glycemic episodes.  Heme/Lymph: No excessive bleeding, bruising, enlarged lymph nodes.  Physical Exam  BP 126/84   Pulse 72   Temp (!) 97.1 F (36.2 C)   Resp 18   Ht 5\' 3"  (1.6 m)   Wt 143 lb 6.4 oz (65 kg)   BMI 25.40 kg/m   General Appearance: Well nourished, well groomed and in no apparent distress.  Eyes: PERRLA, EOMs, conjunctiva no swelling or erythema, normal fundi and vessels. Sinuses: No frontal/maxillary tenderness ENT/Mouth: EACs patent / TMs  nl. Nares clear without erythema, swelling, mucoid exudates. Oral hygiene is good.  No erythema, swelling, or exudate. Tongue normal, non-obstructing. Tonsils not swollen or erythematous. Hearing normal.  Neck: Supple, thyroid normal. No bruits, nodes or JVD. Respiratory: Respiratory effort normal.  BS equal and clear bilateral without rales, rhonci, wheezing or stridor. Cardio: Heart sounds are normal with regular rate and rhythm and no murmurs, rubs or gallops. Peripheral pulses are normal and equal bilaterally without edema. No aortic or femoral bruits. Chest: symmetric with normal excursions and percussion. Breasts: Symmetric, without lumps, nipple discharge, retractions, or fibrocystic changes.  Abdomen: Flat, soft with bowel sounds active. Nontender, no guarding, rebound, hernias, masses, or organomegaly.  Lymphatics: Non tender without lymphadenopathy.  Genitourinary:  Musculoskeletal: Full ROM all peripheral extremities, joint stability, 5/5 strength, and normal gait. Skin: Warm and dry without rashes, lesions, cyanosis, clubbing or  ecchymosis.  Neuro: Cranial nerves intact, reflexes equal bilaterally. Normal muscle tone, no cerebellar symptoms. Sensation intact.  Pysch: Alert and oriented X 3, normal affect, Insight and Judgment appropriate.   Assessment and Plan  1. Annual Preventative Screening Examination  2. Labile hypertension  - EKG 12-Lead - Urinalysis, Routine w reflex microscopic - Microalbumin / creatinine urine ratio - CBC with Differential/Platelet - BASIC METABOLIC PANEL WITH GFR -  Magnesium - TSH  3. Hyperlipidemia, mixed  - EKG 12-Lead - Hepatic function panel - Lipid panel - TSH  4. Prediabetes  - EKG 12-Lead - Hemoglobin A1c - Insulin, random  5. Vitamin D deficiency  - VITAMIN D 25 Hydroxy   6. Hypothyroidism  - TSH  7. Gastroesophageal reflux disease,   8. Irritable bowel syndrome   9. Screening for rectal cancer  - POC Hemoccult Bld/Stl  10. Screening for ischemic heart disease  - EKG 12-Lead  11.  Medication management  - Urinalysis, Routine w reflex microscopic - Microalbumin / creatinine urine ratio - CBC with Differential/Platelet - BASIC METABOLIC PANEL WITH GFR - Hepatic function panel - Magnesium - Lipid panel - TSH - Hemoglobin A1c - Insulin, random - VITAMIN D 25 Hydroxy       Patient was counseled in prudent diet to achieve/maintain BMI less than 25 for weight control, BP monitoring, regular exercise and medications. Discussed med's effects and SE's. Screening labs and tests as requested with regular follow-up as recommended. Over 40 minutes of exam, counseling, chart review and high complex critical decision making was performed.

## 2017-06-14 NOTE — Patient Instructions (Signed)

## 2017-06-15 ENCOUNTER — Encounter: Payer: Self-pay | Admitting: Internal Medicine

## 2017-06-15 ENCOUNTER — Ambulatory Visit (INDEPENDENT_AMBULATORY_CARE_PROVIDER_SITE_OTHER): Payer: PPO | Admitting: Internal Medicine

## 2017-06-15 VITALS — BP 126/84 | HR 72 | Temp 97.1°F | Resp 18 | Ht 63.0 in | Wt 143.4 lb

## 2017-06-15 DIAGNOSIS — K219 Gastro-esophageal reflux disease without esophagitis: Secondary | ICD-10-CM

## 2017-06-15 DIAGNOSIS — E559 Vitamin D deficiency, unspecified: Secondary | ICD-10-CM

## 2017-06-15 DIAGNOSIS — K589 Irritable bowel syndrome without diarrhea: Secondary | ICD-10-CM

## 2017-06-15 DIAGNOSIS — E039 Hypothyroidism, unspecified: Secondary | ICD-10-CM | POA: Diagnosis not present

## 2017-06-15 DIAGNOSIS — R0989 Other specified symptoms and signs involving the circulatory and respiratory systems: Secondary | ICD-10-CM

## 2017-06-15 DIAGNOSIS — Z0000 Encounter for general adult medical examination without abnormal findings: Secondary | ICD-10-CM | POA: Diagnosis not present

## 2017-06-15 DIAGNOSIS — I10 Essential (primary) hypertension: Secondary | ICD-10-CM

## 2017-06-15 DIAGNOSIS — Z136 Encounter for screening for cardiovascular disorders: Secondary | ICD-10-CM

## 2017-06-15 DIAGNOSIS — Z79899 Other long term (current) drug therapy: Secondary | ICD-10-CM | POA: Diagnosis not present

## 2017-06-15 DIAGNOSIS — R7303 Prediabetes: Secondary | ICD-10-CM

## 2017-06-15 DIAGNOSIS — E782 Mixed hyperlipidemia: Secondary | ICD-10-CM

## 2017-06-15 DIAGNOSIS — Z1212 Encounter for screening for malignant neoplasm of rectum: Secondary | ICD-10-CM

## 2017-06-15 DIAGNOSIS — Z0001 Encounter for general adult medical examination with abnormal findings: Secondary | ICD-10-CM

## 2017-06-15 LAB — CBC WITH DIFFERENTIAL/PLATELET
BASOS PCT: 1 %
Basophils Absolute: 48 cells/uL (ref 0–200)
EOS PCT: 1 %
Eosinophils Absolute: 48 cells/uL (ref 15–500)
HCT: 39.1 % (ref 35.0–45.0)
Hemoglobin: 13.4 g/dL (ref 11.7–15.5)
LYMPHS ABS: 2064 {cells}/uL (ref 850–3900)
LYMPHS PCT: 43 %
MCH: 30.5 pg (ref 27.0–33.0)
MCHC: 34.3 g/dL (ref 32.0–36.0)
MCV: 89.1 fL (ref 80.0–100.0)
MONO ABS: 288 {cells}/uL (ref 200–950)
MPV: 9 fL (ref 7.5–12.5)
Monocytes Relative: 6 %
Neutro Abs: 2352 cells/uL (ref 1500–7800)
Neutrophils Relative %: 49 %
Platelets: 329 10*3/uL (ref 140–400)
RBC: 4.39 MIL/uL (ref 3.80–5.10)
RDW: 13.1 % (ref 11.0–15.0)
WBC: 4.8 10*3/uL (ref 3.8–10.8)

## 2017-06-15 LAB — TSH: TSH: 1.23 mIU/L

## 2017-06-16 LAB — LIPID PANEL
Cholesterol: 213 mg/dL — ABNORMAL HIGH (ref <200)
HDL: 65 mg/dL (ref >50)
LDL CALC: 125 mg/dL — AB (ref <100)
TRIGLYCERIDES: 114 mg/dL (ref <150)
Total CHOL/HDL Ratio: 3.3 Ratio (ref <5.0)
VLDL: 23 mg/dL (ref <30)

## 2017-06-16 LAB — URINALYSIS, MICROSCOPIC ONLY
BACTERIA UA: NONE SEEN [HPF]
CASTS: NONE SEEN [LPF]
CRYSTALS: NONE SEEN [HPF]
RBC / HPF: NONE SEEN RBC/HPF (ref <=2)
Squamous Epithelial / LPF: NONE SEEN [HPF] (ref <=5)
WBC, UA: NONE SEEN WBC/HPF (ref <=5)
YEAST: NONE SEEN [HPF]

## 2017-06-16 LAB — BASIC METABOLIC PANEL WITH GFR
BUN: 14 mg/dL (ref 7–25)
CALCIUM: 9.8 mg/dL (ref 8.6–10.4)
CO2: 24 mmol/L (ref 20–32)
Chloride: 104 mmol/L (ref 98–110)
Creat: 0.81 mg/dL (ref 0.50–0.99)
GFR, EST NON AFRICAN AMERICAN: 75 mL/min (ref >=60)
GFR, Est African American: 87 mL/min (ref >=60)
Glucose, Bld: 83 mg/dL (ref 65–99)
POTASSIUM: 4.2 mmol/L (ref 3.5–5.3)
SODIUM: 142 mmol/L (ref 135–146)

## 2017-06-16 LAB — HEPATIC FUNCTION PANEL
ALK PHOS: 38 U/L (ref 33–130)
ALT: 12 U/L (ref 6–29)
AST: 19 U/L (ref 10–35)
Albumin: 4.7 g/dL (ref 3.6–5.1)
BILIRUBIN DIRECT: 0.1 mg/dL (ref <=0.2)
BILIRUBIN INDIRECT: 0.4 mg/dL (ref 0.2–1.2)
BILIRUBIN TOTAL: 0.5 mg/dL (ref 0.2–1.2)
Total Protein: 7.1 g/dL (ref 6.1–8.1)

## 2017-06-16 LAB — HEMOGLOBIN A1C
Hgb A1c MFr Bld: 5.2 % (ref <5.7)
Mean Plasma Glucose: 103 mg/dL

## 2017-06-16 LAB — MICROALBUMIN / CREATININE URINE RATIO
CREATININE, URINE: 78 mg/dL (ref 20–320)
MICROALB/CREAT RATIO: 4 ug/mg{creat} (ref <30)
Microalb, Ur: 0.3 mg/dL

## 2017-06-16 LAB — URINALYSIS, ROUTINE W REFLEX MICROSCOPIC
Bilirubin Urine: NEGATIVE
GLUCOSE, UA: NEGATIVE
Ketones, ur: NEGATIVE
Leukocytes, UA: NEGATIVE
Nitrite: NEGATIVE
PROTEIN: NEGATIVE
SPECIFIC GRAVITY, URINE: 1.012 (ref 1.001–1.035)
pH: 6 (ref 5.0–8.0)

## 2017-06-16 LAB — MAGNESIUM: Magnesium: 1.8 mg/dL (ref 1.5–2.5)

## 2017-06-16 LAB — INSULIN, RANDOM: Insulin: 4.1 u[IU]/mL (ref 2.0–19.6)

## 2017-06-16 LAB — VITAMIN D 25 HYDROXY (VIT D DEFICIENCY, FRACTURES): Vit D, 25-Hydroxy: 103 ng/mL — ABNORMAL HIGH (ref 30–100)

## 2017-07-09 ENCOUNTER — Encounter: Payer: Self-pay | Admitting: Internal Medicine

## 2017-09-07 ENCOUNTER — Ambulatory Visit (AMBULATORY_SURGERY_CENTER): Payer: Self-pay | Admitting: *Deleted

## 2017-09-07 VITALS — Ht 63.0 in | Wt 144.0 lb

## 2017-09-07 DIAGNOSIS — Z86010 Personal history of colonic polyps: Secondary | ICD-10-CM

## 2017-09-07 MED ORDER — NA SULFATE-K SULFATE-MG SULF 17.5-3.13-1.6 GM/177ML PO SOLN: 1.0000 | Freq: Once | ORAL | 0 refills | Status: AC

## 2017-09-07 NOTE — Progress Notes (Signed)
No egg or soy allergy known to patient  No issues with past sedation with any surgeries  or procedures, no intubation problems  No diet pills per patient No home 02 use per patient  No blood thinners per patient  Pt denies issues with constipation  No A fib or A flutter  EMMI video sent to pt's e mail  Pay no more than 50 coupon to pt today for suprep

## 2017-09-21 ENCOUNTER — Other Ambulatory Visit: Payer: Self-pay

## 2017-09-21 ENCOUNTER — Ambulatory Visit (AMBULATORY_SURGERY_CENTER): Payer: PPO | Admitting: Internal Medicine

## 2017-09-21 ENCOUNTER — Encounter: Payer: Self-pay | Admitting: Internal Medicine

## 2017-09-21 VITALS — BP 119/66 | HR 63 | Temp 97.5°F | Resp 12 | Ht 63.0 in | Wt 144.0 lb

## 2017-09-21 DIAGNOSIS — K621 Rectal polyp: Secondary | ICD-10-CM

## 2017-09-21 DIAGNOSIS — D128 Benign neoplasm of rectum: Secondary | ICD-10-CM

## 2017-09-21 DIAGNOSIS — Z1211 Encounter for screening for malignant neoplasm of colon: Secondary | ICD-10-CM | POA: Diagnosis not present

## 2017-09-21 DIAGNOSIS — Z86010 Personal history of colonic polyps: Secondary | ICD-10-CM

## 2017-09-21 MED ORDER — SODIUM CHLORIDE 0.9 % IV SOLN: 500.0000 mL | INTRAVENOUS | Status: DC

## 2017-09-21 NOTE — Patient Instructions (Signed)
Impression/recommendations:  Polyp (handout given)  YOU HAD AN ENDOSCOPIC PROCEDURE TODAY AT [facility]:   Refer to the procedure report that was given to you for any specific questions about what was found during the examination.  If the procedure report does not answer your questions, please call your gastroenterologist to clarify.  If you requested that your care partner not be given the details of your procedure findings, then the procedure report has been included in a sealed envelope for you to review at your convenience later.  YOU SHOULD EXPECT: Some feelings of bloating in the abdomen. Passage of more gas than usual.  Walking can help get rid of the air that was put into your GI tract during the procedure and reduce the bloating. If you had a lower endoscopy (such as a colonoscopy or flexible sigmoidoscopy) you may notice spotting of blood in your stool or on the toilet paper. If you underwent a bowel prep for your procedure, you may not have a normal bowel movement for a few days.  Please Note:  You might notice some irritation and congestion in your nose or some drainage.  This is from the oxygen used during your procedure.  There is no need for concern and it should clear up in a day or so.  SYMPTOMS TO REPORT IMMEDIATELY:   Following lower endoscopy (colonoscopy or flexible sigmoidoscopy):  Excessive amounts of blood in the stool  Significant tenderness or worsening of abdominal pains  Swelling of the abdomen that is new, acute  Fever of 100F or higher  For urgent or emergent issues, a gastroenterologist can be reached at any hour by calling [phone].   DIET:  We do recommend a small meal at first, but then you may proceed to your regular diet.  Drink plenty of fluids but you should avoid alcoholic beverages for 24 hours.  ACTIVITY:  You should plan to take it easy for the rest of today and you should NOT DRIVE or use heavy machinery until tomorrow  (because of the sedation medicines used during the test).    FOLLOW UP: Our staff will call the number listed on your records the next business day following your procedure to check on you and address any questions or concerns that you may have regarding the information given to you following your procedure. If we do not reach you, we will leave a message.  However, if you are feeling well and you are not experiencing any problems, there is no need to return our call.  We will assume that you have returned to your regular daily activities without incident.  If any biopsies were taken you will be contacted by phone or by letter within the next 1-3 weeks.  Please call us at [phone] if you have not heard about the biopsies in 3 weeks.    SIGNATURES/CONFIDENTIALITY: You and/or your care partner have signed paperwork which will be entered into your electronic medical record.  These signatures attest to the fact that that the information above on your After Visit Summary has been reviewed and is understood.  Full responsibility of the confidentiality of this discharge information lies with you and/or your care-partner.

## 2017-09-21 NOTE — Progress Notes (Signed)
Called to room to assist during endoscopic procedure.  Patient ID and intended procedure confirmed with present staff. Received instructions for my participation in the procedure from the performing physician.  

## 2017-09-21 NOTE — Progress Notes (Signed)
To PACU, VSS. Report to [email or web address] 

## 2017-09-21 NOTE — Op Note (Signed)
[facility] Patient Name: Sarah Hartman Procedure Date: 09/21/2017 11:07 AM MRN: [id number] Endoscopist: Docia Chuck. Henrene Pastor , MD Age: 66 Referring MD:  Date of Birth: 1951/08/04 Gender: Female Account #: [id number] Procedure:                Colonoscopy, with cold snare polypectomy x 1 Indications:              High risk colon cancer surveillance: Personal                            history of multiple (3 or more) adenomas. Previous                            examinations 2004, 2008, 2013 Medicines:                Monitored Anesthesia Care Procedure:                Pre-Anesthesia Assessment:                           - Prior to the procedure, a History and Physical                            was performed, and patient medications and                            allergies were reviewed. The patient's tolerance of                            previous anesthesia was also reviewed. The risks                            and benefits of the procedure and the sedation                            options and risks were discussed with the patient.                            All questions were answered, and informed consent                            was obtained. Prior Anticoagulants: The patient has                            taken no previous anticoagulant or antiplatelet                            agents. ASA Grade Assessment: II - A patient with                            mild systemic disease. After reviewing the risks                            and benefits, the patient was deemed in  satisfactory condition to undergo the procedure.                           After obtaining informed consent, the colonoscope                            was passed under direct vision. Throughout the                            procedure, the patient's blood pressure, pulse, and                            oxygen saturations were monitored continuously. The     Colonoscope was introduced through the anus and                            advanced to the the cecum, identified by                            appendiceal orifice and ileocecal valve. The                            ileocecal valve, appendiceal orifice, and rectum                            were photographed. The quality of the bowel                            preparation was excellent. The colonoscopy was                            performed without difficulty. The patient tolerated                            the procedure well. The bowel preparation used was                            SUPREP. Scope In: 11:19:35 AM Scope Out: 11:33:20 AM Scope Withdrawal Time: 0 hours 11 minutes 41 seconds  Total Procedure Duration: 0 hours 13 minutes 45 seconds  Findings:                 A 3 mm polyp was found in the rectum. The polyp was                            removed with a cold snare. Resection and retrieval                            were complete.                           The exam was otherwise without abnormality on                            direct and retroflexion views. Complications:  No immediate complications. Estimated blood loss:                            None. Estimated Blood Loss:     Estimated blood loss: none. Impression:               - One 3 mm polyp in the rectum, removed with a cold                            snare. Resected and retrieved.                           - The examination was otherwise normal on direct                            and retroflexion views. Recommendation:           - Repeat colonoscopy in 5 years for surveillance.                           - Patient has a contact number available for                            emergencies. The signs and symptoms of potential                            delayed complications were discussed with the                            patient. Return to normal activities tomorrow.                            Written  discharge instructions were provided to the                            patient.                           - Resume previous diet.                           - Continue present medications.                           - Await pathology results. Docia Chuck. Henrene Pastor, MD 09/21/2017 11:37:01 AM This report has been signed electronically.

## 2017-09-21 NOTE — Progress Notes (Signed)
Please refer to the blue and neon green sheets for instructions regarding diet and activity for the rest of today.Pt's states no medical or surgical changes since previsit or office visit.

## 2017-09-22 ENCOUNTER — Telehealth: Payer: Self-pay

## 2017-09-22 NOTE — Telephone Encounter (Signed)
  Follow up Call-  Call back number 09/21/2017  Post procedure Call Back phone  # [phone]  Permission to leave phone message Yes  Some recent data might be hidden     Patient questions:  Do you have a fever, pain , or abdominal swelling? No. Pain Score  0 *  Have you tolerated food without any problems? Yes.    Have you been able to return to your normal activities? Yes.    Do you have any questions about your discharge instructions: Diet   No. Medications  No. Follow up visit  No.  Do you have questions or concerns about your Care? No.  Actions: * If pain score is 4 or above: No action needed, pain <4.

## 2017-09-27 ENCOUNTER — Encounter: Payer: Self-pay | Admitting: Internal Medicine

## 2017-10-20 DIAGNOSIS — L821 Other seborrheic keratosis: Secondary | ICD-10-CM | POA: Diagnosis not present

## 2017-10-20 DIAGNOSIS — D2239 Melanocytic nevi of other parts of face: Secondary | ICD-10-CM | POA: Diagnosis not present

## 2017-10-20 DIAGNOSIS — L820 Inflamed seborrheic keratosis: Secondary | ICD-10-CM | POA: Diagnosis not present

## 2017-11-15 ENCOUNTER — Other Ambulatory Visit: Payer: Self-pay | Admitting: Internal Medicine

## 2017-12-17 NOTE — Progress Notes (Signed)
MEDICARE ANNUAL WELLNESS VISIT AND FOLLOW UP  Assessment:   Diagnoses and all orders for this visit:  Encounter for Medicare annual wellness exam  Gastroesophageal reflux disease, esophagitis presence not specified Well managed on current medications; PPI use PRN only, agreeable to switching to ranitidine at time of refill Discussed diet, avoiding triggers and other lifestyle changes -     Magnesium  Irritable bowel syndrome, unspecified type Avoid trigger patient reports asymptomatic this year  Hypothyroidism, unspecified type continue medications the same reminded to take on an empty stomach 30-51mins before food.  -     TSH  Macular degeneration of left eye, unspecified type Managed by current employer at [facility] [facility]  Medication management -     CBC with Differential/Platelet -     BASIC METABOLIC PANEL WITH GFR -     Hepatic function panel  Mixed hyperlipidemia Mild elevation currently treated by lifestyle and red yeast rice supplement Continue to recommend low fat/cholesterol diet, exercise, weight management -     Lipid panel  RLS (restless legs syndrome) Not currently on any prescriptions medications, doing well reportedly using an OTC topical cream that seems to help  Vitamin D deficiency Above goal at last visit; continue supplement to maintain between 70-100 -     VITAMIN D 25 Hydroxy (Vit-D Deficiency, Fractures)  Other abnormal glucose Continue to recommend diet low in processed carbohydrates, exercise, weight management Check A1C annually -     BASIC METABOLIC PANEL WITH GFR  Need for pneumonia vaccination Requests to defer today; due for 13-valent, plans to get in fall  Over 40 minutes of exam, counseling, chart review and critical decision making was performed Future Appointments  Date Time Provider Chancellor  07/19/2018 10:00 AM Unk Pinto, MD [facility] None     Plan:   During the course of the visit the patient was educated  and counseled about appropriate screening and preventive services including:    Pneumococcal vaccine   Prevnar 13  Influenza vaccine  Td vaccine  Screening electrocardiogram  Bone densitometry screening  Colorectal cancer screening  Diabetes screening  Glaucoma screening  Nutrition counseling   Advanced directives: requested   Subjective:  Sarah Hartman is a 67 y.o. female who presents for Medicare Annual Wellness Visit and 3 month follow up. She is followed by Dr. 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   BMI is Body mass index is 25.51 kg/m., she has been working on diet and exercise. Wt Readings from Last 3 Encounters:  12/20/17 144 lb (65.3 kg)  09/21/17 144 lb (65.3 kg)  09/07/17 144 lb (65.3 kg)   she has a diagnosis of GERD which is currently managed by pantoprazole 40 mg PRN only she reports symptoms is currently well controlled, and denies breakthrough reflux, burning in chest, hoarseness or cough.    Today their BP is BP: 124/84 She does workout. She denies chest pain, shortness of breath, dizziness.   She is not on cholesterol medication (treated by lifestyle and red rice yeast supplement) and denies myalgias. Her cholesterol is not at goal. The cholesterol last visit was:   Lab Results  Component Value Date   CHOL 213 (H) 06/15/2017   HDL 65 06/15/2017   LDLCALC 125 (H) 06/15/2017   TRIG 114 06/15/2017   CHOLHDL 3.3 06/15/2017    Last  A1C in the office was:  Lab Results  Component Value Date   HGBA1C 5.2 06/15/2017   Last GFR: Lab Results  Component Value Date   GFRNONAA 75 06/15/2017   Patient is on Vitamin D supplement and above    Lab Results  Component Value Date   VD25OH 103 (H) 06/15/2017     She is on thyroid medication. Her medication was  not changed last visit.   Lab Results  Component Value Date   TSH 1.23 06/15/2017  .   Medication Review: Current Outpatient Medications on File Prior to Visit  Medication Sig Dispense Refill  . beta carotene w/minerals (OCUVITE) tablet Take 1 tablet by mouth daily.    . Cholecalciferol (VITAMIN D3) 5000 units TABS Take 5,000 Units by mouth daily.    Marland Kitchen levothyroxine (SYNTHROID, LEVOTHROID) 50 MCG tablet TAKE 1 TABLET BY MOUTH ONCE DAILY BEFORE  BREAKFAST 90 tablet 1  . pantoprazole (PROTONIX) 40 MG tablet TAKE ONE TABLET BY MOUTH ONCE DAILY AS NEEDED FOR HEARTBURN AND REFLUX 90 tablet 1  . Red Yeast Rice Extract (RED YEAST RICE PO) Take 1,200 mg by mouth daily.    . Calcium Carbonate-Vitamin D (CALCIUM 600 + D PO) Take 600 mg by mouth daily.      Current Facility-Administered Medications on File Prior to Visit  Medication Dose Route Frequency Provider Last Rate Last Dose  . 0.9 %  sodium chloride infusion  500 mL Intravenous Continuous Irene Shipper, MD        Allergies  Allergen Reactions  . Codeine Nausea And Vomiting and Other (See Comments)    Migraine    Current Problems (verified) Patient Active Problem List   Diagnosis Date Noted  . Encounter for Medicare annual wellness exam 12/23/2015  . Mixed hyperlipidemia 02/07/2015  . Other abnormal glucose 02/07/2015  . Hypothyroidism 02/07/2015  . Medication management 02/07/2015  . Vitamin D deficiency 11/15/2013  . GERD (gastroesophageal reflux disease)   . Macular degeneration of left eye   . IBS (irritable bowel syndrome)   . RLS (restless legs syndrome)     Screening Tests Immunization History  Administered Date(s) Administered  . Influenza-Unspecified 07/29/2016  . Pneumococcal Polysaccharide-23 11/09/2012  . Tdap 10/18/2008    Preventative care: Last colonoscopy: 2018 q 5 year Last mammogram: 12/2016 Need reports - requested Last pap smear/pelvic exam: 2018 Dr. Nori Riis DEXA: Has had -  by Dr. Nori Riis - report  requested  TD or Tdap: 2009  Influenza: 2018  Pneumococcal: 2014 Prevnar13: DUE - declines Shingles/Zostavax: n/a in office  Names of Other Physician/Practitioners you currently use: 1. [facility] here for primary care 2. [place], eye doctor, last visit 2018 3. [facility] in [place], Pharmacist, community, last visit 2018  Patient Care Team: Unk Pinto, MD as PCP - General (Internal Medicine) Ninetta Lights, MD as Consulting Physician (Orthopedic Surgery) Irene Shipper, MD as Consulting Physician (Gastroenterology) Festus Aloe, MD as Consulting Physician (Urology) Maisie Fus, MD as Consulting Physician (Obstetrics and Gynecology)  SURGICAL HISTORY She  has a past surgical history that includes Colonoscopy; Tubal ligation; Tendon reconstruction (Right, 04/27/2013); and Polypectomy. FAMILY HISTORY Her family history includes Alzheimer's disease in her mother; Cerebral aneurysm in her father; Hypertension in her father. SOCIAL HISTORY She  reports that she quit smoking about 36 years ago. She started smoking about 51 years ago. She has a 28.00 pack-year smoking history. she has never used smokeless tobacco. She reports that she drinks  about 1.8 oz of alcohol per week. She reports that she does not use drugs.   MEDICARE WELLNESS OBJECTIVES: Physical activity: Current Exercise Habits: Home exercise routine, Type of exercise: treadmill;calisthenics, Time (Minutes): 45, Frequency (Times/Week): 3, Weekly Exercise (Minutes/Week): 135, Intensity: Intense, Exercise limited by: None identified Cardiac risk factors: Cardiac Risk Factors include: advanced age (>51men, >28 women);hypertension;dyslipidemia;smoking/ tobacco exposure Depression/mood screen:   Depression screen [facility] 2/9 12/20/2017  Decreased Interest 0  Down, Depressed, Hopeless 0  PHQ - 2 Score 0    ADLs:  In your present state of health, do you have any difficulty performing the  following activities: 12/20/2017 06/15/2017  Hearing? N N  Vision? N N  Difficulty concentrating or making decisions? N N  Walking or climbing stairs? N N  Dressing or bathing? N N  Doing errands, shopping? N N  Preparing Food and eating ? - -  Using the Toilet? - -  In the past six months, have you accidently leaked urine? - -  Do you have problems with loss of bowel control? - -  Managing your Medications? - -  Managing your Finances? - -  Housekeeping or managing your Housekeeping? - -  Some recent data might be hidden     Cognitive Testing  Alert? Yes  Normal Appearance?Yes  Oriented to person? Yes  Place? Yes   Time? Yes  Recall of three objects?  Yes  Can perform simple calculations? Yes  Displays appropriate judgment?Yes  Can read the correct time from a watch face?Yes  EOL planning: Does Patient Have a Medical Advance Directive?: No Would patient like information on creating a medical advance directive?: Yes (MAU/Ambulatory/Procedural Areas - Information given)  Review of Systems  Constitutional: Negative for malaise/fatigue and weight loss.  HENT: Negative for hearing loss and tinnitus.   Eyes: Negative for blurred vision and double vision.  Respiratory: Negative for cough, shortness of breath and wheezing.   Cardiovascular: Negative for chest pain, palpitations, orthopnea, claudication and leg swelling.  Gastrointestinal: Negative for abdominal pain, blood in stool, constipation, diarrhea, heartburn, melena, nausea and vomiting.  Genitourinary: Positive for urgency. Negative for dysuria, flank pain, frequency and hematuria.  Musculoskeletal: Negative for falls, joint pain and myalgias.  Skin: Negative for rash.  Neurological: Negative for dizziness, tingling, sensory change, weakness and headaches.  Endo/Heme/Allergies: Negative for polydipsia.  Psychiatric/Behavioral: Negative.  Negative for depression, memory loss and substance abuse. The patient is not  nervous/anxious and does not have insomnia.   All other systems reviewed and are negative.    Objective:     Today's Vitals   12/20/17 0844  BP: 124/84  Pulse: 67  Temp: 97.9 F (36.6 C)  SpO2: 99%  Weight: 144 lb (65.3 kg)  Height: 5\' 3"  (1.6 m)   Body mass index is 25.51 kg/m.  General appearance: alert, no distress, WD/WN, female HEENT: normocephalic, sclerae anicteric, TMs pearly, nares patent, no discharge or erythema, pharynx normal Oral cavity: MMM, no lesions Neck: supple, no lymphadenopathy, no thyromegaly, no masses Heart: RRR, normal S1, S2, no murmurs Lungs: CTA bilaterally, no wheezes, rhonchi, or rales Abdomen: +bs, soft, non tender, non distended, no masses, no hepatomegaly, no splenomegaly Musculoskeletal: nontender, no swelling, no obvious deformity Extremities: no edema, no cyanosis, no clubbing Pulses: 2+ symmetric, upper and lower extremities, normal cap refill Neurological: alert, oriented x 3, CN2-12 intact, strength normal upper extremities and lower extremities, sensation normal throughout, DTRs 2+ throughout, no cerebellar signs, gait normal Psychiatric: normal affect, behavior normal, pleasant  Medicare Attestation I have personally reviewed: The patient's medical and social history Their use of alcohol, tobacco or illicit drugs Their current medications and supplements The patient's functional ability including ADLs,fall risks, home safety risks, cognitive, and hearing and visual impairment Diet and physical activities Evidence for depression or mood disorders  The patient's weight, height, BMI, and visual acuity have been recorded in the chart.  I have made referrals, counseling, and provided education to the patient based on review of the above and I have provided the patient with a written personalized care plan for preventive services.     Izora Ribas, NP   12/20/2017

## 2017-12-20 ENCOUNTER — Encounter: Payer: Self-pay | Admitting: Adult Health

## 2017-12-20 ENCOUNTER — Ambulatory Visit (INDEPENDENT_AMBULATORY_CARE_PROVIDER_SITE_OTHER): Payer: PPO | Admitting: Adult Health

## 2017-12-20 VITALS — BP 124/84 | HR 67 | Temp 97.9°F | Ht 63.0 in | Wt 144.0 lb

## 2017-12-20 DIAGNOSIS — E782 Mixed hyperlipidemia: Secondary | ICD-10-CM | POA: Diagnosis not present

## 2017-12-20 DIAGNOSIS — Z0001 Encounter for general adult medical examination with abnormal findings: Secondary | ICD-10-CM

## 2017-12-20 DIAGNOSIS — R7309 Other abnormal glucose: Secondary | ICD-10-CM

## 2017-12-20 DIAGNOSIS — G2581 Restless legs syndrome: Secondary | ICD-10-CM

## 2017-12-20 DIAGNOSIS — Z79899 Other long term (current) drug therapy: Secondary | ICD-10-CM

## 2017-12-20 DIAGNOSIS — K589 Irritable bowel syndrome without diarrhea: Secondary | ICD-10-CM

## 2017-12-20 DIAGNOSIS — R6889 Other general symptoms and signs: Secondary | ICD-10-CM | POA: Diagnosis not present

## 2017-12-20 DIAGNOSIS — K219 Gastro-esophageal reflux disease without esophagitis: Secondary | ICD-10-CM

## 2017-12-20 DIAGNOSIS — E039 Hypothyroidism, unspecified: Secondary | ICD-10-CM

## 2017-12-20 DIAGNOSIS — H3530 Unspecified macular degeneration: Secondary | ICD-10-CM | POA: Diagnosis not present

## 2017-12-20 DIAGNOSIS — E559 Vitamin D deficiency, unspecified: Secondary | ICD-10-CM

## 2017-12-20 DIAGNOSIS — Z0000 Encounter for general adult medical examination without abnormal findings: Secondary | ICD-10-CM

## 2017-12-20 NOTE — Patient Instructions (Addendum)
Due for 13 valent pneumonia vaccine -     Aim for 7+ servings of fruits and vegetables daily  80+ fluid ounces of water or unsweet tea for healthy kidneys  1 drink of alcohol per day  Limit animal fats in diet for cholesterol and heart health - choose grass fed whenever available  Aim for low stress - take time to unwind and care for your mental health  Aim for 150 min of moderate intensity exercise weekly for heart health, and weights twice weekly for bone health  Aim for 7-9 hours of sleep daily

## 2017-12-21 LAB — CBC WITH DIFFERENTIAL/PLATELET
BASOS ABS: 37 {cells}/uL (ref 0–200)
Basophils Relative: 0.6 %
EOS ABS: 37 {cells}/uL (ref 15–500)
EOS PCT: 0.6 %
HEMATOCRIT: 39.8 % (ref 35.0–45.0)
Hemoglobin: 13.9 g/dL (ref 11.7–15.5)
LYMPHS ABS: 2213 {cells}/uL (ref 850–3900)
MCH: 30.8 pg (ref 27.0–33.0)
MCHC: 34.9 g/dL (ref 32.0–36.0)
MCV: 88.2 fL (ref 80.0–100.0)
MPV: 9.4 fL (ref 7.5–12.5)
Monocytes Relative: 5.7 %
Neutro Abs: 3559 cells/uL (ref 1500–7800)
Neutrophils Relative %: 57.4 %
Platelets: 345 10*3/uL (ref 140–400)
RBC: 4.51 10*6/uL (ref 3.80–5.10)
RDW: 12.4 % (ref 11.0–15.0)
Total Lymphocyte: 35.7 %
WBC: 6.2 10*3/uL (ref 3.8–10.8)
WBCMIX: 353 {cells}/uL (ref 200–950)

## 2017-12-21 LAB — HEPATIC FUNCTION PANEL
AG RATIO: 1.9 (calc) (ref 1.0–2.5)
ALT: 14 U/L (ref 6–29)
AST: 20 U/L (ref 10–35)
Albumin: 5 g/dL (ref 3.6–5.1)
Alkaline phosphatase (APISO): 44 U/L (ref 33–130)
BILIRUBIN TOTAL: 0.6 mg/dL (ref 0.2–1.2)
Bilirubin, Direct: 0.1 mg/dL (ref 0.0–0.2)
Globulin: 2.6 g/dL (calc) (ref 1.9–3.7)
Indirect Bilirubin: 0.5 mg/dL (calc) (ref 0.2–1.2)
Total Protein: 7.6 g/dL (ref 6.1–8.1)

## 2017-12-21 LAB — URINALYSIS W MICROSCOPIC + REFLEX CULTURE
BACTERIA UA: NONE SEEN /HPF
BILIRUBIN URINE: NEGATIVE
Glucose, UA: NEGATIVE
Hgb urine dipstick: NEGATIVE
Hyaline Cast: NONE SEEN /LPF
Ketones, ur: NEGATIVE
Leukocyte Esterase: NEGATIVE
NITRITES URINE, INITIAL: NEGATIVE
Protein, ur: NEGATIVE
RBC / HPF: NONE SEEN /HPF (ref 0–2)
SPECIFIC GRAVITY, URINE: 1.013 (ref 1.001–1.03)
SQUAMOUS EPITHELIAL / LPF: NONE SEEN /HPF (ref <=5)
WBC, UA: NONE SEEN /HPF (ref 0–5)
pH: 6 (ref 5.0–8.0)

## 2017-12-21 LAB — VITAMIN D 25 HYDROXY (VIT D DEFICIENCY, FRACTURES): Vit D, 25-Hydroxy: 107 ng/mL — ABNORMAL HIGH (ref 30–100)

## 2017-12-21 LAB — LIPID PANEL
Cholesterol: 226 mg/dL — ABNORMAL HIGH (ref <200)
HDL: 83 mg/dL (ref >50)
LDL CHOLESTEROL (CALC): 121 mg/dL — AB
NON-HDL CHOLESTEROL (CALC): 143 mg/dL — AB (ref <130)
TRIGLYCERIDES: 112 mg/dL (ref <150)
Total CHOL/HDL Ratio: 2.7 (calc) (ref <5.0)

## 2017-12-21 LAB — BASIC METABOLIC PANEL WITH GFR
BUN: 17 mg/dL (ref 7–25)
CO2: 30 mmol/L (ref 20–32)
CREATININE: 0.92 mg/dL (ref 0.50–0.99)
Calcium: 10.4 mg/dL (ref 8.6–10.4)
Chloride: 103 mmol/L (ref 98–110)
GFR, EST AFRICAN AMERICAN: 75 mL/min/{1.73_m2} (ref >=60)
GFR, EST NON AFRICAN AMERICAN: 64 mL/min/{1.73_m2} (ref >=60)
Glucose, Bld: 97 mg/dL (ref 65–99)
Potassium: 4.3 mmol/L (ref 3.5–5.3)
SODIUM: 141 mmol/L (ref 135–146)

## 2017-12-21 LAB — NO CULTURE INDICATED

## 2017-12-21 LAB — TSH: TSH: 1.7 m[IU]/L (ref 0.40–4.50)

## 2017-12-21 LAB — MAGNESIUM: Magnesium: 2 mg/dL (ref 1.5–2.5)

## 2017-12-28 ENCOUNTER — Encounter: Payer: Self-pay | Admitting: Adult Health

## 2017-12-28 DIAGNOSIS — M8580 Other specified disorders of bone density and structure, unspecified site: Secondary | ICD-10-CM | POA: Insufficient documentation

## 2018-01-24 DIAGNOSIS — Z1231 Encounter for screening mammogram for malignant neoplasm of breast: Secondary | ICD-10-CM | POA: Diagnosis not present

## 2018-01-24 LAB — HM MAMMOGRAPHY: HM MAMMO: NORMAL (ref 0–4)

## 2018-02-15 DIAGNOSIS — N760 Acute vaginitis: Secondary | ICD-10-CM | POA: Diagnosis not present

## 2018-02-15 DIAGNOSIS — Z01419 Encounter for gynecological examination (general) (routine) without abnormal findings: Secondary | ICD-10-CM | POA: Diagnosis not present

## 2018-02-15 DIAGNOSIS — Z6825 Body mass index (BMI) 25.0-25.9, adult: Secondary | ICD-10-CM | POA: Diagnosis not present

## 2018-02-15 DIAGNOSIS — R350 Frequency of micturition: Secondary | ICD-10-CM | POA: Diagnosis not present

## 2018-02-15 DIAGNOSIS — R300 Dysuria: Secondary | ICD-10-CM | POA: Diagnosis not present

## 2018-02-15 DIAGNOSIS — R3915 Urgency of urination: Secondary | ICD-10-CM | POA: Diagnosis not present

## 2018-05-13 ENCOUNTER — Other Ambulatory Visit: Payer: Self-pay | Admitting: Internal Medicine

## 2018-07-19 ENCOUNTER — Encounter: Payer: Self-pay | Admitting: Internal Medicine

## 2018-08-01 ENCOUNTER — Ambulatory Visit (INDEPENDENT_AMBULATORY_CARE_PROVIDER_SITE_OTHER): Payer: PPO | Admitting: Internal Medicine

## 2018-08-01 ENCOUNTER — Encounter: Payer: Self-pay | Admitting: Internal Medicine

## 2018-08-01 VITALS — BP 122/80 | HR 68 | Temp 97.6°F | Resp 16 | Ht 63.0 in | Wt 145.2 lb

## 2018-08-01 DIAGNOSIS — G2581 Restless legs syndrome: Secondary | ICD-10-CM

## 2018-08-01 DIAGNOSIS — Z23 Encounter for immunization: Secondary | ICD-10-CM | POA: Diagnosis not present

## 2018-08-01 DIAGNOSIS — Z0000 Encounter for general adult medical examination without abnormal findings: Secondary | ICD-10-CM | POA: Diagnosis not present

## 2018-08-01 DIAGNOSIS — R7303 Prediabetes: Secondary | ICD-10-CM | POA: Diagnosis not present

## 2018-08-01 DIAGNOSIS — Z8249 Family history of ischemic heart disease and other diseases of the circulatory system: Secondary | ICD-10-CM

## 2018-08-01 DIAGNOSIS — E782 Mixed hyperlipidemia: Secondary | ICD-10-CM

## 2018-08-01 DIAGNOSIS — Z87891 Personal history of nicotine dependence: Secondary | ICD-10-CM

## 2018-08-01 DIAGNOSIS — I10 Essential (primary) hypertension: Secondary | ICD-10-CM

## 2018-08-01 DIAGNOSIS — E039 Hypothyroidism, unspecified: Secondary | ICD-10-CM | POA: Diagnosis not present

## 2018-08-01 DIAGNOSIS — E559 Vitamin D deficiency, unspecified: Secondary | ICD-10-CM

## 2018-08-01 DIAGNOSIS — Z79899 Other long term (current) drug therapy: Secondary | ICD-10-CM

## 2018-08-01 DIAGNOSIS — Z136 Encounter for screening for cardiovascular disorders: Secondary | ICD-10-CM

## 2018-08-01 DIAGNOSIS — Z1211 Encounter for screening for malignant neoplasm of colon: Secondary | ICD-10-CM

## 2018-08-01 DIAGNOSIS — R7309 Other abnormal glucose: Secondary | ICD-10-CM

## 2018-08-01 DIAGNOSIS — K219 Gastro-esophageal reflux disease without esophagitis: Secondary | ICD-10-CM

## 2018-08-01 DIAGNOSIS — R0989 Other specified symptoms and signs involving the circulatory and respiratory systems: Secondary | ICD-10-CM | POA: Diagnosis not present

## 2018-08-01 DIAGNOSIS — Z0001 Encounter for general adult medical examination with abnormal findings: Secondary | ICD-10-CM

## 2018-08-01 DIAGNOSIS — Z1212 Encounter for screening for malignant neoplasm of rectum: Secondary | ICD-10-CM

## 2018-08-01 NOTE — Patient Instructions (Signed)
We Do NOT Approve of  [facility] [place] Soliciting Our Patients  To Do Home Visits & We Do NOT Approve of [employer] > > > > > > > > > > > > > > > > > > > > > > > > > > > > > > > > > > > > > > >  Preventive Care for Adults  A healthy lifestyle and preventive care can promote health and wellness. Preventive health guidelines for women include the following key practices.  A routine yearly physical is a good way to check with your health care provider about your health and preventive screening. It is a chance to share any concerns and updates on your health and to receive a thorough exam.  Visit your dentist for a routine exam and preventive care every 6 months. Brush your teeth twice a day and floss once a day. Good oral hygiene prevents tooth decay and gum disease.  The frequency of eye exams is based on your age, health, family medical history, use of contact lenses, and other factors. Follow your health care provider's recommendations for frequency of eye exams.  Eat a healthy diet. Foods like vegetables, fruits, whole grains, low-fat dairy products, and lean protein foods contain the nutrients you need without too many calories. Decrease your intake of foods high in solid fats, added sugars, and salt. Eat the right amount of calories for you. Get information about a proper diet from your health care provider, if necessary.  Regular physical exercise is one of the most important things you can do for your health. Most adults should get at least 150 minutes of moderate-intensity exercise (any activity that increases your heart rate and causes you to sweat) each week. In addition, most adults need muscle-strengthening exercises on 2 or more days a week.  Maintain a healthy weight. The body mass index (BMI) is a screening tool to identify possible weight problems. It provides an estimate of body fat based on height and weight. Your health care provider can find your BMI  and can help you achieve or maintain a healthy weight. For adults 20 years and older:  A BMI below 18.5 is considered underweight.  A BMI of 18.5 to 24.9 is normal.  A BMI of 25 to 29.9 is considered overweight.  A BMI of 30 and above is considered obese.  Maintain normal blood lipids and cholesterol levels by exercising and minimizing your intake of saturated fat. Eat a balanced diet with plenty of fruit and vegetables. If your lipid or cholesterol levels are high, you are over 50, or you are at high risk for heart disease, you may need your cholesterol levels checked more frequently. Ongoing high lipid and cholesterol levels should be treated with medicines if diet and exercise are not working.  If you smoke, find out from your health care provider how to quit. If you do not use tobacco, do not start.  Lung cancer screening is recommended for adults aged 55-80 years who are at high risk for developing lung cancer because of a history of smoking. A yearly low-dose CT scan of the lungs is recommended for people who have at least a 30-pack-year history of smoking and are a current smoker or have quit within the past 15 years. A pack year of smoking is smoking an average of 1 pack of cigarettes a day for 1 year (for example: 1 pack a day for 30 years or 2 packs a day   for 15 years). Yearly screening should continue until the smoker has stopped smoking for at least 15 years. Yearly screening should be stopped for people who develop a health problem that would prevent them from having lung cancer treatment.  Avoid use of street drugs. Do not share needles with anyone. Ask for help if you need support or instructions about stopping the use of drugs.  High blood pressure causes heart disease and increases the risk of stroke.  Ongoing high blood pressure should be treated with medicines if weight loss and exercise do not work.  If you are 29-34 years old, ask your health care provider if you should take  aspirin to prevent strokes.  Diabetes screening involves taking a blood sample to check your fasting blood sugar level. This should be done once every 3 years, after age 27, if you are within normal weight and without risk factors for diabetes. Testing should be considered at a younger age or be carried out more frequently if you are overweight and have at least 1 risk factor for diabetes.  Breast cancer screening is essential preventive care for women. You should practice "breast self-awareness." This means understanding the normal appearance and feel of your breasts and may include breast self-examination. Any changes detected, no matter how small, should be reported to a health care provider. Women in their 64s and 30s should have a clinical breast exam (CBE) by a health care provider as part of a regular health exam every 1 to 3 years. After age 69, women should have a CBE every year. Starting at age 6, women should consider having a mammogram (breast X-ray test) every year. Women who have a family history of breast cancer should talk to their health care provider about genetic screening. Women at a high risk of breast cancer should talk to their health care providers about having an MRI and a mammogram every year.  Breast cancer gene (BRCA)-related cancer risk assessment is recommended for women who have family members with BRCA-related cancers. BRCA-related cancers include breast, ovarian, tubal, and peritoneal cancers. Having family members with these cancers may be associated with an increased risk for harmful changes (mutations) in the breast cancer genes BRCA1 and BRCA2. Results of the assessment will determine the need for genetic counseling and BRCA1 and BRCA2 testing.  Routine pelvic exams to screen for cancer are no longer recommended for nonpregnant women who are considered low risk for cancer of the pelvic organs (ovaries, uterus, and vagina) and who do not have symptoms. Ask your health  care provider if a screening pelvic exam is right for you.  If you have had past treatment for cervical cancer or a condition that could lead to cancer, you need Pap tests and screening for cancer for at least 20 years after your treatment. If Pap tests have been discontinued, your risk factors (such as having a new sexual partner) need to be reassessed to determine if screening should be resumed. Some women have medical problems that increase the chance of getting cervical cancer. In these cases, your health care provider may recommend more frequent screening and Pap tests.    Colorectal cancer can be detected and often prevented. Most routine colorectal cancer screening begins at the age of 21 years and continues through age 8 years. However, your health care provider may recommend screening at an earlier age if you have risk factors for colon cancer. On a yearly basis, your health care provider may provide home test kits to check  for hidden blood in the stool. Use of a small camera at the end of a tube, to directly examine the colon (sigmoidoscopy or colonoscopy), can detect the earliest forms of colorectal cancer. Talk to your health care provider about this at age 50, when routine screening begins.  Direct exam of the colon should be repeated every 5-10 years through age 75 years, unless early forms of pre-cancerous polyps or small growths are found.  Osteoporosis is a disease in which the bones lose minerals and strength with aging. This can result in serious bone fractures or breaks. The risk of osteoporosis can be identified using a bone density scan. Women ages 65 years and over and women at risk for fractures or osteoporosis should discuss screening with their health care providers. Ask your health care provider whether you should take a calcium supplement or vitamin D to reduce the rate of osteoporosis.  Menopause can be associated with physical symptoms and risks. Hormone replacement therapy  is available to decrease symptoms and risks. You should talk to your health care provider about whether hormone replacement therapy is right for you.  Use sunscreen. Apply sunscreen liberally and repeatedly throughout the day. You should seek shade when your shadow is shorter than you. Protect yourself by wearing long sleeves, pants, a wide-brimmed hat, and sunglasses year round, whenever you are outdoors.  Once a month, do a whole body skin exam, using a mirror to look at the skin on your back. Tell your health care provider of new moles, moles that have irregular borders, moles that are larger than a pencil eraser, or moles that have changed in shape or color.  Stay current with required vaccines (immunizations).  Influenza vaccine. All adults should be immunized every year.  Tetanus, diphtheria, and acellular pertussis (Td, Tdap) vaccine. Pregnant women should receive 1 dose of Tdap vaccine during each pregnancy. The dose should be obtained regardless of the length of time since the last dose. Immunization is preferred during the 27th-36th week of gestation. An adult who has not previously received Tdap or who does not know her vaccine status should receive 1 dose of Tdap. This initial dose should be followed by tetanus and diphtheria toxoids (Td) booster doses every 10 years. Adults with an unknown or incomplete history of completing a 3-dose immunization series with Td-containing vaccines should begin or complete a primary immunization series including a Tdap dose. Adults should receive a Td booster every 10 years.    Zoster vaccine. One dose is recommended for adults aged 60 years or older unless certain conditions are present.    Pneumococcal 13-valent conjugate (PCV13) vaccine. When indicated, a person who is uncertain of her immunization history and has no record of immunization should receive the PCV13 vaccine. An adult aged 19 years or older who has certain medical conditions and has not  been previously immunized should receive 1 dose of PCV13 vaccine. This PCV13 should be followed with a dose of pneumococcal polysaccharide (PPSV23) vaccine. The PPSV23 vaccine dose should be obtained at least 1 or more year(s) after the dose of PCV13 vaccine. An adult aged 19 years or older who has certain medical conditions and previously received 1 or more doses of PPSV23 vaccine should receive 1 dose of PCV13. The PCV13 vaccine dose should be obtained 1 or more years after the last PPSV23 vaccine dose.    Pneumococcal polysaccharide (PPSV23) vaccine. When PCV13 is also indicated, PCV13 should be obtained first. All adults aged 65 years and older should   be immunized. An adult younger than age 65 years who has certain medical conditions should be immunized. Any person who resides in a nursing home or long-term care facility should be immunized. An adult smoker should be immunized. People with an immunocompromised condition and certain other conditions should receive both PCV13 and PPSV23 vaccines. People with human immunodeficiency virus (HIV) infection should be immunized as soon as possible after diagnosis. Immunization during chemotherapy or radiation therapy should be avoided. Routine use of PPSV23 vaccine is not recommended for American Indians, [place] Natives, or people younger than 65 years unless there are medical conditions that require PPSV23 vaccine. When indicated, people who have unknown immunization and have no record of immunization should receive PPSV23 vaccine. One-time revaccination 5 years after the first dose of PPSV23 is recommended for people aged 19-64 years who have chronic kidney failure, nephrotic syndrome, asplenia, or immunocompromised conditions. People who received 1-2 doses of PPSV23 before age 65 years should receive another dose of PPSV23 vaccine at age 65 years or later if at least 5 years have passed since the previous dose. Doses of PPSV23 are not needed for people immunized  with PPSV23 at or after age 65 years.   Preventive Services / Frequency  Ages 65 years and over  Blood pressure check.  Lipid and cholesterol check.  Lung cancer screening. / Every year if you are aged 55-80 years and have a 30-pack-year history of smoking and currently smoke or have quit within the past 15 years. Yearly screening is stopped once you have quit smoking for at least 15 years or develop a health problem that would prevent you from having lung cancer treatment.  Clinical breast exam.** / Every year after age 40 years.   BRCA-related cancer risk assessment.** / For women who have family members with a BRCA-related cancer (breast, ovarian, tubal, or peritoneal cancers).  Mammogram.** / Every year beginning at age 40 years and continuing for as long as you are in good health. Consult with your health care provider.  Pap test.** / Every 3 years starting at age 30 years through age 65 or 70 years with 3 consecutive normal Pap tests. Testing can be stopped between 65 and 70 years with 3 consecutive normal Pap tests and no abnormal Pap or HPV tests in the past 10 years.  Fecal occult blood test (FOBT) of stool. / Every year beginning at age 50 years and continuing until age 75 years. You may not need to do this test if you get a colonoscopy every 10 years.  Flexible sigmoidoscopy or colonoscopy.** / Every 5 years for a flexible sigmoidoscopy or every 10 years for a colonoscopy beginning at age 50 years and continuing until age 75 years.  Hepatitis C blood test.** / For all people born from 1945 through 1965 and any individual with known risks for hepatitis C.  Osteoporosis screening.** / A one-time screening for women ages 65 years and over and women at risk for fractures or osteoporosis.  Skin self-exam. / Monthly.  Influenza vaccine. / Every year.  Tetanus, diphtheria, and acellular pertussis (Tdap/Td) vaccine.** / 1 dose of Td every 10 years.  Zoster vaccine.** / 1 dose  for adults aged 60 years or older.  Pneumococcal 13-valent conjugate (PCV13) vaccine.** / Consult your health care provider.  Pneumococcal polysaccharide (PPSV23) vaccine.** / 1 dose for all adults aged 65 years and older. Screening for abdominal aortic aneurysm (AAA)  by ultrasound is recommended for people who have history of high blood pressure   or who are current or former smokers. ++++++++++++++++++++ Recommend Adult Low Dose Aspirin or  coated  Aspirin 81 mg daily  To reduce risk of Colon Cancer 20 %,  Skin Cancer 26 % ,  Melanoma 46%  and  Pancreatic cancer 60% ++++++++++++++++++++ Vitamin D goal  is between 70-100.  Please make sure that you are taking your Vitamin D as directed.  It is very important as a natural anti-inflammatory  helping hair, skin, and nails, as well as reducing stroke and heart attack risk.  It helps your bones and helps with mood. It also decreases numerous cancer risks so please take it as directed.  Low Vit D is associated with a 200-300% higher risk for CANCER  and 200-300% higher risk for HEART   ATTACK  &  STROKE.   ...................................... It is also associated with higher death rate at younger ages,  autoimmune diseases like Rheumatoid arthritis, Lupus, Multiple Sclerosis.    Also many other serious conditions, like depression, Alzheimer's Dementia, infertility, muscle aches, fatigue, fibromyalgia - just to name a few. ++++++++++++++++++ Recommend the book "The END of DIETING" by Dr Joel Fuhrman  & the book "The END of DIABETES " by Dr Joel Fuhrman At [employer] - get book & Audio CD's    Being diabetic has a  300% increased risk for heart attack, stroke, cancer, and alzheimer- type vascular dementia. It is very important that you work harder with diet by avoiding all foods that are white. Avoid white rice (brown & wild rice is OK), white potatoes (sweetpotatoes in moderation is OK), White bread or wheat bread or anything made out of  white flour like bagels, donuts, rolls, buns, biscuits, cakes, pastries, cookies, pizza crust, and pasta (made from white flour & egg whites) - vegetarian pasta or spinach or wheat pasta is OK. Multigrain breads like Arnold's or Pepperidge [employer], or multigrain sandwich thins or flatbreads.  Diet, exercise and weight loss can reverse and cure diabetes in the early stages.  Diet, exercise and weight loss is very important in the control and prevention of complications of diabetes which affects every system in your body, ie. Brain - dementia/stroke, eyes - glaucoma/blindness, heart - heart attack/heart failure, kidneys - dialysis, stomach - gastric paralysis, intestines - malabsorption, nerves - severe painful neuritis, circulation - gangrene & loss of a leg(s), and finally cancer and Alzheimers.    I recommend avoid fried & greasy foods,  sweets/candy, white rice (brown or wild rice or Quinoa is OK), white potatoes (sweet potatoes are OK) - anything made from white flour - bagels, doughnuts, rolls, buns, biscuits,white and wheat breads, pizza crust and traditional pasta made of white flour & egg white(vegetarian pasta or spinach or wheat pasta is OK).  Multi-grain bread is OK - like multi-grain flat bread or sandwich thins. Avoid alcohol in excess. Exercise is also important.    Eat all the vegetables you want - avoid meat, especially red meat and dairy - especially cheese.  Cheese is the most concentrated form of trans-fats which is the worst thing to clog up our arteries. Veggie cheese is OK which can be found in the fresh produce section at [employer] or [employer] or [employer]  +++++++++++++++++++ DASH Eating Plan  DASH stands for "Dietary Approaches to Stop Hypertension."   The DASH eating plan is a healthy eating plan that has been shown to reduce high blood pressure (hypertension). Additional health benefits may include reducing the risk of type 2 diabetes mellitus, heart disease,   and stroke. The  DASH eating plan may also help with weight loss. WHAT DO I NEED TO KNOW ABOUT THE DASH EATING PLAN? For the DASH eating plan, you will follow these general guidelines:  Choose foods with a percent daily value for sodium of less than 5% (as listed on the food label).  Use salt-free seasonings or herbs instead of table salt or sea salt.  Check with your health care provider or pharmacist before using salt substitutes.  Eat lower-sodium products, often labeled as "lower sodium" or "no salt added."  Eat fresh foods.  Eat more vegetables, fruits, and low-fat dairy products.  Choose whole grains. Look for the word "whole" as the first word in the ingredient list.  Choose fish   Limit sweets, desserts, sugars, and sugary drinks.  Choose heart-healthy fats.  Eat veggie cheese   Eat more home-cooked food and less restaurant, buffet, and fast food.  Limit fried foods.  Cook foods using methods other than frying.  Limit canned vegetables. If you do use them, rinse them well to decrease the sodium.  When eating at a restaurant, ask that your food be prepared with less salt, or no salt if possible.                      WHAT FOODS CAN I EAT? Read Dr Fara Olden Fuhrman's books on The End of Dieting & The End of Diabetes  Grains Whole grain or whole wheat bread. Brown rice. Whole grain or whole wheat pasta. Quinoa, bulgur, and whole grain cereals. Low-sodium cereals. Corn or whole wheat flour tortillas. Whole grain cornbread. Whole grain crackers. Low-sodium crackers.  Vegetables Fresh or frozen vegetables (raw, steamed, roasted, or grilled). Low-sodium or reduced-sodium tomato and vegetable juices. Low-sodium or reduced-sodium tomato sauce and paste. Low-sodium or reduced-sodium canned vegetables.   Fruits All fresh, canned (in natural juice), or frozen fruits.  Protein Products  All fish and seafood.  Dried beans, peas, or lentils. Unsalted nuts and seeds. Unsalted canned  beans.  Dairy Low-fat dairy products, such as skim or 1% milk, 2% or reduced-fat cheeses, low-fat ricotta or cottage cheese, or plain low-fat yogurt. Low-sodium or reduced-sodium cheeses.  Fats and Oils Tub margarines without trans fats. Light or reduced-fat mayonnaise and salad dressings (reduced sodium). Avocado. Safflower, olive, or canola oils. Natural peanut or almond butter.  Other Unsalted popcorn and pretzels. The items listed above may not be a complete list of recommended foods or beverages. Contact your dietitian for more options.  +++++++++++++++  WHAT FOODS ARE NOT RECOMMENDED? Grains/ White flour or wheat flour White bread. White pasta. White rice. Refined cornbread. Bagels and croissants. Crackers that contain trans fat.  Vegetables  Creamed or fried vegetables. Vegetables in a . Regular canned vegetables. Regular canned tomato sauce and paste. Regular tomato and vegetable juices.  Fruits Dried fruits. Canned fruit in light or heavy syrup. Fruit juice.  Meat and Other Protein Products Meat in general - RED meat & White meat.  Fatty cuts of meat. Ribs, chicken wings, all processed meats as bacon, sausage, bologna, salami, fatback, hot dogs, bratwurst and packaged luncheon meats.  Dairy Whole or 2% milk, cream, half-and-half, and cream cheese. Whole-fat or sweetened yogurt. Full-fat cheeses or blue cheese. Non-dairy creamers and whipped toppings. Processed cheese, cheese spreads, or cheese curds.  Condiments Onion and garlic salt, seasoned salt, table salt, and sea salt. Canned and packaged gravies. Worcestershire sauce. Tartar sauce. Barbecue sauce. Teriyaki sauce. Soy sauce, including reduced  sodium. Steak sauce. Fish sauce. Oyster sauce. Cocktail sauce. Horseradish. Ketchup and mustard. Meat flavorings and tenderizers. Bouillon cubes. Hot sauce. Tabasco sauce. Marinades. Taco seasonings. Relishes.  Fats and Oils Butter, stick margarine, lard, shortening and bacon  fat. Coconut, palm kernel, or palm oils. Regular salad dressings.  Pickles and olives. Salted popcorn and pretzels.  The items listed above may not be a complete list of foods and beverages to avoid.

## 2018-08-01 NOTE — Progress Notes (Signed)
[facility] Sarah Hartman, M.D.     Uvaldo Bristle. Silverio Lay, P.A.-C Liane Comber, [place] [address] [place], [place] [address] Telephone [phone] Telefax [phone] Annual Screening/Preventative Visit & Comprehensive Evaluation &  Examination     This very nice 67 y.o. DWF  presents for a Screening /Preventative Visit & comprehensive evaluation and management of multiple medical co-morbidities.  Patient has been followed for labile HTN, HLD, Prediabetes  and Vitamin D Deficiency.     Labile  HTN predates since 2007 . Patient's BP has been controlled at home and patient denies any cardiac symptoms as chest pain, palpitations, shortness of breath, dizziness or ankle swelling. Today's BP is at goal - 122/80      Patient's hyperlipidemia is not controlled with diet as patient has been reluctant to start Chol meds. Last lipids were not at goal: Lab Results  Component Value Date   CHOL 226 (H) 12/20/2017   HDL 83 12/20/2017   LDLCALC 121 (H) 12/20/2017   TRIG 112 12/20/2017   CHOLHDL 2.7 12/20/2017      Patient has hx/o prediabetes (A1c 5.7%/2012)  and patient denies reactive hypoglycemic symptoms, visual blurring, diabetic polys or paresthesias. Last A1c was Normal & at goal: Lab Results  Component Value Date   HGBA1C 5.2 06/15/2017      Patient has been on Thyroid Replacement since 2001.     Finally, patient has history of Vitamin D Deficiency ("46" on treatment in 2017) and last Vitamin D was  Lab Results  Component Value Date   VD25OH 107 (H) 12/20/2017   Current Outpatient Medications on File Prior to Visit  Medication Sig  . beta carotene w/minerals (OCUVITE) tablet Take 1 tablet by mouth daily.  . Cholecalciferol (VITAMIN D3) 5000 units TABS Take 5,000 Units by mouth daily.  Marland Kitchen levothyroxine (SYNTHROID, LEVOTHROID) 50 MCG tablet TAKE 1 TABLET BY MOUTH ONCE DAILY BEFORE BREAKFAST  .  pantoprazole (PROTONIX) 40 MG tablet TAKE ONE TABLET BY MOUTH ONCE DAILY AS NEEDED FOR HEARTBURN AND REFLUX  . Red Yeast Rice Extract (RED YEAST RICE PO) Take 1,200 mg by mouth daily.   No current facility-administered medications on file prior to visit.    Allergies  Allergen Reactions  . Codeine Nausea And Vomiting and Other (See Comments)    Migraine   Past Medical History:  Diagnosis Date  . Adenomatous colon polyp   . Allergy   . GERD (gastroesophageal reflux disease)   . Heart murmur    history of as teenager   . IBS (irritable bowel syndrome)   . Labile hypertension   . Macular degeneration of left eye   . RLS (restless legs syndrome)   . Thyroid disease    hypothyroid  . Vitamin D deficiency    Health Maintenance  Topic Date Due  . INFLUENZA VACCINE  06/02/2018  . TETANUS/TDAP  10/18/2018  . MAMMOGRAM  01/20/2019  . COLONOSCOPY  09/21/2022  . DEXA SCAN  Completed  . Hepatitis C Screening  Completed  . PNA vac Low Risk Adult  Completed   Immunization History  Administered Date(s) Administered  . Influenza, High Dose Seasonal PF 08/01/2018  . Influenza-Unspecified 07/29/2016  . Pneumococcal Conjugate-13 08/19/2015  . Pneumococcal Polysaccharide-23 11/09/2012, 08/01/2018  . Tdap 10/18/2008   Last Colon - 09/21/2017 - Dr Henrene Pastor - recc 5 yr f/u due Nov 2023  Last MGM - 01/19/2018 [facility]  Past Surgical History:  Procedure Laterality Date  .  COLONOSCOPY    . POLYPECTOMY    . TENDON RECONSTRUCTION Right 04/27/2013   Procedure: RIGHT ELBOW DEBRIDE/REPAIR/EXPLORE - LATERAL HUMERAL EPICONDYLE;  Surgeon: Ninetta Lights, MD;  Location: [facility];  Service: Orthopedics;  Laterality: Right;  . TUBAL LIGATION     Family History  Problem Relation Age of Onset  . Hypertension Father   . Cerebral aneurysm Father   . Alzheimer's disease Mother   . Colon cancer Neg Hx   . Esophageal cancer Neg Hx   . Rectal cancer Neg Hx   . Stomach cancer Neg Hx     Social History   Tobacco Use  . Smoking status: Former Smoker    Packs/day: 2.00    Years: 14.00    Pack years: 28.00    Start date: 11/02/1966    Last attempt to quit: 04/24/1981    Years since quitting: 37.2  . Smokeless tobacco: Never Used  Substance Use Topics  . Alcohol use: Yes    Alcohol/week: 3.0 standard drinks    Types: 3 Glasses of wine per week    Comment: occ  . Drug use: No    ROS Constitutional: Denies fever, chills, weight loss/gain, headaches, insomnia,  night sweats, and change in appetite. Does c/o fatigue. Eyes: Denies redness, blurred vision, diplopia, discharge, itchy, watery eyes.  ENT: Denies discharge, congestion, post nasal drip, epistaxis, sore throat, earache, hearing loss, dental pain, Tinnitus, Vertigo, Sinus pain, snoring.  Cardio: Denies chest pain, palpitations, irregular heartbeat, syncope, dyspnea, diaphoresis, orthopnea, PND, claudication, edema Respiratory: denies cough, dyspnea, DOE, pleurisy, hoarseness, laryngitis, wheezing.  Gastrointestinal: Denies dysphagia, heartburn, reflux, water brash, pain, cramps, nausea, vomiting, bloating, diarrhea, constipation, hematemesis, melena, hematochezia, jaundice, hemorrhoids Genitourinary: Denies dysuria, frequency, urgency, nocturia, hesitancy, discharge, hematuria, flank pain Breast: Breast lumps, nipple discharge, bleeding.  Musculoskeletal: Denies arthralgia, myalgia, stiffness, Jt. Swelling, pain, limp, and strain/sprain. Denies falls. Skin: Denies puritis, rash, hives, warts, acne, eczema, changing in skin lesion Neuro: No weakness, tremor, incoordination, spasms, paresthesia, pain Psychiatric: Denies confusion, memory loss, sensory loss. Denies Depression. Endocrine: Denies change in weight, skin, hair change, nocturia, and paresthesia, diabetic polys, visual blurring, hyper / hypo glycemic episodes.  Heme/Lymph: No excessive bleeding, bruising, enlarged lymph nodes.  Physical Exam  BP 122/80    Pulse 68   Temp 97.6 F (36.4 C)   Resp 16   Ht 5\' 3"  (1.6 m)   Wt 145 lb 3.2 oz (65.9 kg)   BMI 25.72 kg/m   General Appearance: Well nourished, well groomed and in no apparent distress.  Eyes: PERRLA, EOMs, conjunctiva no swelling or erythema, normal fundi and vessels. Sinuses: No frontal/maxillary tenderness ENT/Mouth: EACs patent / TMs  nl. Nares clear without erythema, swelling, mucoid exudates. Oral hygiene is good. No erythema, swelling, or exudate. Tongue normal, non-obstructing. Tonsils not swollen or erythematous. Hearing normal.  Neck: Supple, thyroid not palpable. No bruits, nodes or JVD. Respiratory: Respiratory effort normal.  BS equal and clear bilateral without rales, rhonci, wheezing or stridor. Cardio: Heart sounds are normal with regular rate and rhythm and no murmurs, rubs or gallops. Peripheral pulses are normal and equal bilaterally without edema. No aortic or femoral bruits. Chest: symmetric with normal excursions and percussion. Breasts: Symmetric, without lumps, nipple discharge, retractions, or fibrocystic changes.  Abdomen: Flat, soft with bowel sounds active. Nontender, no guarding, rebound, hernias, masses, or organomegaly.  Lymphatics: Non tender without lymphadenopathy.  Genitourinary:  Musculoskeletal: Full ROM all peripheral extremities, joint stability, 5/5 strength, and normal  gait. Skin: Warm and dry without rashes, lesions, cyanosis, clubbing or  ecchymosis.  Neuro: Cranial nerves intact, reflexes equal bilaterally. Normal muscle tone, no cerebellar symptoms. Sensation intact.  Pysch: Alert and oriented x 3, normal affect, Insight and Judgment appropriate.   Assessment and Plan  1. Annual Preventative Screening Examination  2. Labile hypertension  - EKG 12-Lead - Urinalysis, Routine w reflex microscopic - Microalbumin / creatinine urine ratio - CBC with Differential/Platelet - COMPLETE METABOLIC PANEL WITH GFR - Magnesium - TSH  3.  Hyperlipidemia, mixed  - EKG 12-Lead - Lipid panel  4. Abnormal glucose  - EKG 12-Lead - Hemoglobin A1c - Insulin, random  5. Vitamin D deficiency  - VITAMIN D 25 Hydroxyl  6. Prediabetes  - EKG 12-Lead - Hemoglobin A1c - Insulin, random  7. Hypothyroidism  - TSH  8. Gastroesophageal reflux disease  - CBC with Differential/Platelet  9. Encounter for colorectal cancer screening   10. Screening for ischemic heart disease  - EKG 12-Lead  11. FH: hypertension  - EKG 12-Lead  12. Former smoker  - EKG 12-Lead  13. Medication management  - Urinalysis, Routine w reflex microscopic - Microalbumin / creatinine urine ratio - CBC with Differential/Platelet - COMPLETE METABOLIC PANEL WITH GFR - Magnesium - Lipid panel - TSH - Hemoglobin A1c - Insulin, random - VITAMIN D 25 Hydroxyl  14. Need for prophylactic vaccination and inoculation against influenza  - Flu vaccine HIGH DOSE PF (Fluzone High dose)  15. Need for prophylactic vaccination against Streptococcus pneumoniae (pneumococcus)  - Pneumococcal polysaccharide vaccine 23-valent greater than or equal to 2yo subcutaneous/IM       Patient was counseled in prudent diet to achieve/maintain BMI less than 25 for weight control, BP monitoring, regular exercise and medications. Discussed med's effects and SE's. Screening labs and tests as requested with regular follow-up as recommended. Over 40 minutes of exam, counseling, chart review and high complex critical decision making was performed.

## 2018-08-02 ENCOUNTER — Other Ambulatory Visit: Payer: Self-pay | Admitting: Internal Medicine

## 2018-08-02 DIAGNOSIS — E782 Mixed hyperlipidemia: Secondary | ICD-10-CM

## 2018-08-02 LAB — URINALYSIS, ROUTINE W REFLEX MICROSCOPIC
BILIRUBIN URINE: NEGATIVE
Glucose, UA: NEGATIVE
Hgb urine dipstick: NEGATIVE
KETONES UR: NEGATIVE
Leukocytes, UA: NEGATIVE
Nitrite: NEGATIVE
PROTEIN: NEGATIVE
Specific Gravity, Urine: 1.01 (ref 1.001–1.03)
pH: 6.5 (ref 5.0–8.0)

## 2018-08-02 LAB — CBC WITH DIFFERENTIAL/PLATELET
BASOS ABS: 43 {cells}/uL (ref 0–200)
Basophils Relative: 0.6 %
EOS ABS: 43 {cells}/uL (ref 15–500)
Eosinophils Relative: 0.6 %
HEMATOCRIT: 38 % (ref 35.0–45.0)
Hemoglobin: 13.2 g/dL (ref 11.7–15.5)
Lymphs Abs: 2194 cells/uL (ref 850–3900)
MCH: 30.8 pg (ref 27.0–33.0)
MCHC: 34.7 g/dL (ref 32.0–36.0)
MCV: 88.6 fL (ref 80.0–100.0)
MPV: 9.1 fL (ref 7.5–12.5)
Monocytes Relative: 4.9 %
NEUTROS ABS: 4473 {cells}/uL (ref 1500–7800)
NEUTROS PCT: 63 %
Platelets: 419 10*3/uL — ABNORMAL HIGH (ref 140–400)
RBC: 4.29 10*6/uL (ref 3.80–5.10)
RDW: 12.6 % (ref 11.0–15.0)
Total Lymphocyte: 30.9 %
WBC: 7.1 10*3/uL (ref 3.8–10.8)
WBCMIX: 348 {cells}/uL (ref 200–950)

## 2018-08-02 LAB — COMPLETE METABOLIC PANEL WITH GFR
AG RATIO: 1.8 (calc) (ref 1.0–2.5)
ALBUMIN MSPROF: 4.9 g/dL (ref 3.6–5.1)
ALKALINE PHOSPHATASE (APISO): 42 U/L (ref 33–130)
ALT: 16 U/L (ref 6–29)
AST: 18 U/L (ref 10–35)
BUN: 14 mg/dL (ref 7–25)
CO2: 29 mmol/L (ref 20–32)
CREATININE: 0.88 mg/dL (ref 0.50–0.99)
Calcium: 10.1 mg/dL (ref 8.6–10.4)
Chloride: 104 mmol/L (ref 98–110)
GFR, Est African American: 78 mL/min/{1.73_m2} (ref >=60)
GFR, Est Non African American: 68 mL/min/{1.73_m2} (ref >=60)
GLOBULIN: 2.7 g/dL (ref 1.9–3.7)
Glucose, Bld: 94 mg/dL (ref 65–99)
POTASSIUM: 4.3 mmol/L (ref 3.5–5.3)
SODIUM: 142 mmol/L (ref 135–146)
Total Bilirubin: 0.5 mg/dL (ref 0.2–1.2)
Total Protein: 7.6 g/dL (ref 6.1–8.1)

## 2018-08-02 LAB — LIPID PANEL
CHOL/HDL RATIO: 3.2 (calc) (ref <5.0)
Cholesterol: 205 mg/dL — ABNORMAL HIGH (ref <200)
HDL: 64 mg/dL (ref >50)
LDL CHOLESTEROL (CALC): 114 mg/dL — AB
Non-HDL Cholesterol (Calc): 141 mg/dL (calc) — ABNORMAL HIGH (ref <130)
Triglycerides: 156 mg/dL — ABNORMAL HIGH (ref <150)

## 2018-08-02 LAB — HEMOGLOBIN A1C
EAG (MMOL/L): 6 (calc)
HEMOGLOBIN A1C: 5.4 %{Hb} (ref <5.7)
Mean Plasma Glucose: 108 (calc)

## 2018-08-02 LAB — INSULIN, RANDOM: Insulin: 3.9 u[IU]/mL (ref 2.0–19.6)

## 2018-08-02 LAB — MICROALBUMIN / CREATININE URINE RATIO
Creatinine, Urine: 33 mg/dL (ref 20–275)
Microalb Creat Ratio: 6 mcg/mg creat (ref <30)
Microalb, Ur: 0.2 mg/dL

## 2018-08-02 LAB — VITAMIN D 25 HYDROXY (VIT D DEFICIENCY, FRACTURES): Vit D, 25-Hydroxy: 124 ng/mL — ABNORMAL HIGH (ref 30–100)

## 2018-08-02 LAB — TSH: TSH: 1.54 m[IU]/L (ref 0.40–4.50)

## 2018-08-02 LAB — MAGNESIUM: Magnesium: 1.9 mg/dL (ref 1.5–2.5)

## 2018-08-02 MED ORDER — ROSUVASTATIN CALCIUM 40 MG PO TABS: ORAL_TABLET | ORAL | 1 refills | Status: DC

## 2018-08-09 ENCOUNTER — Encounter: Payer: Self-pay | Admitting: *Deleted

## 2018-10-21 ENCOUNTER — Other Ambulatory Visit: Payer: Self-pay | Admitting: Internal Medicine

## 2018-10-21 MED ORDER — AZITHROMYCIN 250 MG PO TABS: ORAL_TABLET | ORAL | 1 refills | Status: DC

## 2018-10-21 MED ORDER — PREDNISONE 20 MG PO TABS: ORAL_TABLET | ORAL | 0 refills | Status: DC

## 2018-11-08 ENCOUNTER — Other Ambulatory Visit: Payer: Self-pay | Admitting: Adult Health

## 2018-12-21 NOTE — Progress Notes (Signed)
MEDICARE ANNUAL WELLNESS VISIT AND FOLLOW UP  Assessment:   Diagnoses and all orders for this visit:  Encounter for Medicare annual wellness exam  Gastroesophageal reflux disease, esophagitis presence not specified Well managed on current medications; PPI use PRN only Discussed diet, avoiding triggers and other lifestyle changes -     Magnesium  Irritable bowel syndrome, unspecified type Avoid trigger patient reports asymptomatic this year  Hypothyroidism, unspecified type continue medications the same reminded to take on an empty stomach 30-68mins before food.  -     TSH  Macular degeneration of left eye, unspecified type Managed by current employer at [facility]  Medication management -     CBC with Differential/Platelet -     CMP/GFR -     Magnesium   Mixed hyperlipidemia Newly on rosuvastatin 20 mg every other day  Continue to recommend low fat/cholesterol diet, exercise, weight management -     Lipid panel  RLS (restless legs syndrome) Not currently on any prescriptions medications, doing well reportedly using an OTC topical cream that seems to help  Vitamin D deficiency Above goal at last visit; continue supplement to maintain between 70-100 -     VITAMIN D 25 Hydroxy (Vit-D Deficiency, Fractures)  Other abnormal glucose Continue to recommend diet low in processed carbohydrates, exercise, weight management Check A1C annually -     CMP/GFRWITH GFR   Over 40 minutes of exam, counseling, chart review and critical decision making was performed Future Appointments  Date Time Provider [place]  08/16/2019  9:00 AM Unk Pinto, MD [facility] None     Plan:   During the course of the visit the patient was educated and counseled about appropriate screening and preventive services including:    Pneumococcal vaccine   Prevnar 13  Influenza vaccine  Td vaccine  Screening electrocardiogram  Bone densitometry screening  Colorectal  cancer screening  Diabetes screening  Glaucoma screening  Nutrition counseling   Advanced directives: requested   Subjective:  Sarah Hartman is a 68 y.o. female who presents for Medicare Annual Wellness Visit and 3 month follow up.   She is followed by Dr. 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 replentix She reports symptoms improved recently.   BMI is Body mass index is 26.04 kg/m., she has been working on diet and exercise. Wt Readings from Last 3 Encounters:  12/27/18 147 lb (66.7 kg)  08/01/18 145 lb 3.2 oz (65.9 kg)  12/20/17 144 lb (65.3 kg)   she has a diagnosis of GERD which is currently managed by pantoprazole 40 mg PRN only, typically takes a few days monthly.  she reports symptoms is currently well controlled, and denies breakthrough reflux, burning in chest, hoarseness or cough.    Today their BP is BP: (!) 146/88 She does workout. She denies chest pain, shortness of breath, dizziness.   She is on cholesterol medication (newly on rosuvastatin 20 mg every other day) and denies myalgias. Her cholesterol is not at goal. The cholesterol last visit was:   Lab Results  Component Value Date   CHOL 205 (H) 08/01/2018   HDL 64 08/01/2018   LDLCALC 114 (H) 08/01/2018   TRIG 156 (H) 08/01/2018   CHOLHDL 3.2 08/01/2018    Last A1C in the office was:  Lab Results  Component Value Date   HGBA1C 5.4 08/01/2018   Last GFR: Lab Results  Component  Value Date   GFRNONAA 68 08/01/2018   Patient is on Vitamin D supplement and above goal at last check  Lab Results  Component Value Date   VD25OH 124 (H) 08/01/2018     She is on thyroid medication. Her medication was not changed last visit.   Lab Results  Component Value Date   TSH 1.54 08/01/2018  .   Medication Review: Current Outpatient Medications on File Prior to Visit  Medication Sig  Dispense Refill  . beta carotene w/minerals (OCUVITE) tablet Take 1 tablet by mouth daily.    . Cholecalciferol (VITAMIN D3) 5000 units TABS Take 5,000 Units by mouth daily.    Marland Kitchen levothyroxine (SYNTHROID, LEVOTHROID) 50 MCG tablet TAKE 1 TABLET BY MOUTH ONCE DAILY BEFORE BREAKFAST 90 tablet 0  . pantoprazole (PROTONIX) 40 MG tablet TAKE ONE TABLET BY MOUTH ONCE DAILY AS NEEDED FOR HEARTBURN AND REFLUX 90 tablet 1  . rosuvastatin (CRESTOR) 40 MG tablet Take 1/2 to 1 tablet daily or as directed for Cholesterol (Patient taking differently: Takes 1/2 tablet every other day) 90 tablet 1   No current facility-administered medications on file prior to visit.     Allergies  Allergen Reactions  . Codeine Nausea And Vomiting and Other (See Comments)    Migraine    Current Problems (verified) Patient Active Problem List   Diagnosis Date Noted  . Osteopenia 12/28/2017  . Encounter for Medicare annual wellness exam 12/23/2015  . Mixed hyperlipidemia 02/07/2015  . Other abnormal glucose 02/07/2015  . Hypothyroidism 02/07/2015  . Medication management 02/07/2015  . Vitamin D deficiency 11/15/2013  . GERD (gastroesophageal reflux disease)   . Macular degeneration of left eye   . IBS (irritable bowel syndrome)   . RLS (restless legs syndrome)     Screening Tests Immunization History  Administered Date(s) Administered  . Influenza, High Dose Seasonal PF 08/01/2018  . Influenza-Unspecified 07/29/2016  . Pneumococcal Conjugate-13 08/19/2015  . Pneumococcal Polysaccharide-23 11/09/2012, 08/01/2018  . Tdap 10/18/2008    Preventative care: Last colonoscopy: 2018 q 5 year Last mammogram: 12/2017, has scheduled 01/2019 Last pap smear/pelvic exam: 2018 Dr. Nori Riis DEXA: 04/2016 -  by Dr. Nori Riis  - osteopenia - has scheduled this year  TD or Tdap: 2009, DUE Influenza: 2019  Pneumococcal: 2014, 2019 Prevnar13: 2016 Shingles/Zostavax: n/a in office  Names of Other Physician/Practitioners you  currently use: 1. [facility] here for primary care 2. [place], eye doctor, last visit 2019 3. [facility] in [place], Pharmacist, community, last visit 2019  Patient Care Team: Unk Pinto, MD as PCP - General (Internal Medicine) Ninetta Lights, MD as Consulting Physician (Orthopedic Surgery) Irene Shipper, MD as Consulting Physician (Gastroenterology) Festus Aloe, MD as Consulting Physician (Urology) Maisie Fus, MD as Consulting Physician (Obstetrics and Gynecology)  SURGICAL HISTORY She  has a past surgical history that includes Colonoscopy; Tubal ligation; Tendon reconstruction (Right, 04/27/2013); and Polypectomy. FAMILY HISTORY Her family history includes Alzheimer's disease in her mother; Cerebral aneurysm in her father; Hypertension in her father; Stomach cancer (age of onset: 82) in her paternal grandmother. SOCIAL HISTORY She  reports that she quit smoking about 37 years ago. She started smoking about 52 years ago. She has a 28.00 pack-year smoking history. She has never used smokeless tobacco. She reports current alcohol use of about 3.0 standard drinks of alcohol per week. She reports that she does not use drugs.   MEDICARE WELLNESS OBJECTIVES: Physical activity: Current Exercise Habits: Home exercise routine,  Type of exercise: walking;treadmill;strength training/weights, Time (Minutes): 45, Frequency (Times/Week): 3, Weekly Exercise (Minutes/Week): 135, Intensity: Mild, Exercise limited by: None identified Cardiac risk factors: Cardiac Risk Factors include: advanced age (>36men, >10 women);dyslipidemia;smoking/ tobacco exposure Depression/mood screen:   Depression screen [facility] 2/9 12/27/2018  Decreased Interest 0  Down, Depressed, Hopeless 0  PHQ - 2 Score 0    ADLs:  In your present state of health, do you have any difficulty performing the following activities: 12/27/2018 08/01/2018  Hearing? N N  Vision? N N  Difficulty  concentrating or making decisions? N N  Walking or climbing stairs? N N  Dressing or bathing? N N  Doing errands, shopping? N N  Some recent data might be hidden     Cognitive Testing  Alert? Yes  Normal Appearance?Yes  Oriented to person? Yes  Place? Yes   Time? Yes  Recall of three objects?  Yes  Can perform simple calculations? Yes  Displays appropriate judgment?Yes  Can read the correct time from a watch face?Yes  EOL planning: Does Patient Have a Medical Advance Directive?: No Would patient like information on creating a medical advance directive?: No - Patient declined  Review of Systems  Constitutional: Negative for malaise/fatigue and weight loss.  HENT: Negative for hearing loss and tinnitus.   Eyes: Negative for blurred vision and double vision.  Respiratory: Negative for cough, shortness of breath and wheezing.   Cardiovascular: Negative for chest pain, palpitations, orthopnea, claudication and leg swelling.  Gastrointestinal: Negative for abdominal pain, blood in stool, constipation, diarrhea, heartburn, melena, nausea and vomiting.  Genitourinary: Negative for dysuria, flank pain, frequency, hematuria and urgency.  Musculoskeletal: Negative for falls, joint pain and myalgias.  Skin: Negative for rash.  Neurological: Negative for dizziness, tingling, sensory change, weakness and headaches.  Endo/Heme/Allergies: Negative for polydipsia.  Psychiatric/Behavioral: Negative.  Negative for depression, memory loss and substance abuse. The patient is not nervous/anxious and does not have insomnia.   All other systems reviewed and are negative.    Objective:     Today's Vitals   12/27/18 1345  BP: (!) 146/88  Pulse: 74  Temp: 97.7 F (36.5 C)  SpO2: 98%  Weight: 147 lb (66.7 kg)  Height: 5\' 3"  (1.6 m)   Body mass index is 26.04 kg/m.  General appearance: alert, no distress, WD/WN, female HEENT: normocephalic, sclerae anicteric, TMs pearly, nares patent, no  discharge or erythema, pharynx normal Oral cavity: MMM, no lesions Neck: supple, no lymphadenopathy, no thyromegaly, no masses Heart: RRR, normal S1, S2, no murmurs Lungs: CTA bilaterally, no wheezes, rhonchi, or rales Abdomen: +bs, soft, non tender, non distended, no masses, no hepatomegaly, no splenomegaly Musculoskeletal: nontender, no swelling, no obvious deformity Extremities: no edema, no cyanosis, no clubbing Pulses: 2+ symmetric, upper and lower extremities, normal cap refill Neurological: alert, oriented x 3, CN2-12 intact, strength normal upper extremities and lower extremities, sensation normal throughout, DTRs 2+ throughout, no cerebellar signs, gait normal Psychiatric: normal affect, behavior normal, pleasant   Medicare Attestation I have personally reviewed: The patient's medical and social history Their use of alcohol, tobacco or illicit drugs Their current medications and supplements The patient's functional ability including ADLs,fall risks, home safety risks, cognitive, and hearing and visual impairment Diet and physical activities Evidence for depression or mood disorders  The patient's weight, height, BMI, and visual acuity have been recorded in the chart.  I have made referrals, counseling, and provided education to the patient based on review of the above and I have  provided the patient with a written personalized care plan for preventive services.     Izora Ribas, NP   12/27/2018

## 2018-12-26 ENCOUNTER — Ambulatory Visit: Payer: Self-pay | Admitting: Adult Health

## 2018-12-27 ENCOUNTER — Ambulatory Visit (INDEPENDENT_AMBULATORY_CARE_PROVIDER_SITE_OTHER): Payer: PPO | Admitting: Adult Health

## 2018-12-27 ENCOUNTER — Encounter: Payer: Self-pay | Admitting: Adult Health

## 2018-12-27 VITALS — BP 146/88 | HR 74 | Temp 97.7°F | Ht 63.0 in | Wt 147.0 lb

## 2018-12-27 DIAGNOSIS — Z6825 Body mass index (BMI) 25.0-25.9, adult: Secondary | ICD-10-CM

## 2018-12-27 DIAGNOSIS — Z23 Encounter for immunization: Secondary | ICD-10-CM | POA: Diagnosis not present

## 2018-12-27 DIAGNOSIS — E039 Hypothyroidism, unspecified: Secondary | ICD-10-CM | POA: Diagnosis not present

## 2018-12-27 DIAGNOSIS — Z0000 Encounter for general adult medical examination without abnormal findings: Secondary | ICD-10-CM

## 2018-12-27 DIAGNOSIS — H3530 Unspecified macular degeneration: Secondary | ICD-10-CM

## 2018-12-27 DIAGNOSIS — Z79899 Other long term (current) drug therapy: Secondary | ICD-10-CM | POA: Diagnosis not present

## 2018-12-27 DIAGNOSIS — Z0001 Encounter for general adult medical examination with abnormal findings: Secondary | ICD-10-CM | POA: Diagnosis not present

## 2018-12-27 DIAGNOSIS — M8589 Other specified disorders of bone density and structure, multiple sites: Secondary | ICD-10-CM

## 2018-12-27 DIAGNOSIS — R7309 Other abnormal glucose: Secondary | ICD-10-CM | POA: Diagnosis not present

## 2018-12-27 DIAGNOSIS — K219 Gastro-esophageal reflux disease without esophagitis: Secondary | ICD-10-CM | POA: Diagnosis not present

## 2018-12-27 DIAGNOSIS — E782 Mixed hyperlipidemia: Secondary | ICD-10-CM | POA: Diagnosis not present

## 2018-12-27 DIAGNOSIS — K589 Irritable bowel syndrome without diarrhea: Secondary | ICD-10-CM

## 2018-12-27 DIAGNOSIS — G2581 Restless legs syndrome: Secondary | ICD-10-CM

## 2018-12-27 DIAGNOSIS — E559 Vitamin D deficiency, unspecified: Secondary | ICD-10-CM | POA: Diagnosis not present

## 2018-12-27 DIAGNOSIS — R6889 Other general symptoms and signs: Secondary | ICD-10-CM | POA: Diagnosis not present

## 2018-12-27 NOTE — Patient Instructions (Addendum)
Sarah Hartman , Thank you for taking time to come for your Medicare Wellness Visit. I appreciate your ongoing commitment to your health goals. Please review the following plan we discussed and let me know if I can assist you in the future.   These are the goals we discussed: Goals    . LDL CALC < 100       This is a list of the screening recommended for you and due dates:  Health Maintenance  Topic Date Due  . Mammogram  01/20/2019  . Colon Cancer Screening  09/21/2022  . Tetanus Vaccine  12/27/2028  . Flu Shot  Completed  . DEXA scan (bone density measurement)  Completed  .  Hepatitis C: One time screening is recommended by Center for Disease Control  (CDC) for  adults born from [birth year] through 1966.   Completed  . Pneumonia vaccines  Completed    HYPERTENSION INFORMATION  Monitor your blood pressure at home, please keep a record and bring that in with you to your next office visit.   Go to the ER if any CP, SOB, nausea, dizziness, severe HA, changes vision/speech  Your most recent BP: BP: (!) 146/88   Maintain a healthy weight. Get at least 150 minutes of aerobic exercise per week. Minimize salt intake. Minimize alcohol intake  DASH Eating Plan DASH stands for "Dietary Approaches to Stop Hypertension." The DASH eating plan is a healthy eating plan that has been shown to reduce high blood pressure (hypertension). Additional health benefits may include reducing the risk of type 2 diabetes mellitus, heart disease, and stroke. The DASH eating plan may also help with weight loss. WHAT DO I NEED TO KNOW ABOUT THE DASH EATING PLAN? For the DASH eating plan, you will follow these general guidelines:  Choose foods with a percent daily value for sodium of less than 5% (as listed on the food label).  Use salt-free seasonings or herbs instead of table salt or sea salt.  Check with your health care provider or pharmacist before using salt substitutes.  Eat lower-sodium products, often  labeled as "lower sodium" or "no salt added."  Eat fresh foods.  Eat more vegetables, fruits, and low-fat dairy products.  Choose whole grains. Look for the word "whole" as the first word in the ingredient list.  Choose fish and skinless chicken or [place] more often than red meat. Limit fish, poultry, and meat to 6 oz (170 g) each day.  Limit sweets, desserts, sugars, and sugary drinks.  Choose heart-healthy fats.  Limit cheese to 1 oz (28 g) per day.  Eat more home-cooked food and less restaurant, buffet, and fast food.  Limit fried foods.  Cook foods using methods other than frying.  Limit canned vegetables. If you do use them, rinse them well to decrease the sodium.  When eating at a restaurant, ask that your food be prepared with less salt, or no salt if possible. WHAT FOODS CAN I EAT? Seek help from a dietitian for individual calorie needs. Grains Whole grain or whole wheat bread. Brown rice. Whole grain or whole wheat pasta. Quinoa, bulgur, and whole grain cereals. Low-sodium cereals. Corn or whole wheat flour tortillas. Whole grain cornbread. Whole grain crackers. Low-sodium crackers. Vegetables Fresh or frozen vegetables (raw, steamed, roasted, or grilled). Low-sodium or reduced-sodium tomato and vegetable juices. Low-sodium or reduced-sodium tomato sauce and paste. Low-sodium or reduced-sodium canned vegetables.  Fruits All fresh, canned (in natural juice), or frozen fruits. Meat and Other [employer] fast streamer  beef (85% or leaner), grass-fed beef, or beef trimmed of fat. Skinless chicken or [place]. Ground chicken or [place]. Pork trimmed of fat. All fish and seafood. Eggs. Dried beans, peas, or lentils. Unsalted nuts and seeds. Unsalted canned beans. Dairy Low-fat dairy products, such as skim or 1% milk, 2% or reduced-fat cheeses, low-fat ricotta or cottage cheese, or plain low-fat yogurt. Low-sodium or reduced-sodium cheeses. Fats and Oils Tub margarines without  trans fats. Light or reduced-fat mayonnaise and salad dressings (reduced sodium). Avocado. Safflower, olive, or canola oils. Natural peanut or almond butter. Other Unsalted popcorn and pretzels. The items listed above may not be a complete list of recommended foods or beverages. Contact your dietitian for more options. WHAT FOODS ARE NOT RECOMMENDED? Grains White bread. White pasta. White rice. Refined cornbread. Bagels and croissants. Crackers that contain trans fat. Vegetables Creamed or fried vegetables. Vegetables in a cheese sauce. Regular canned vegetables. Regular canned tomato sauce and paste. Regular tomato and vegetable juices. Fruits Dried fruits. Canned fruit in light or heavy syrup. Fruit juice. Meat and Other Protein Products Fatty cuts of meat. Ribs, chicken wings, bacon, sausage, bologna, salami, chitterlings, fatback, hot dogs, bratwurst, and packaged luncheon meats. Salted nuts and seeds. Canned beans with salt. Dairy Whole or 2% milk, cream, half-and-half, and cream cheese. Whole-fat or sweetened yogurt. Full-fat cheeses or blue cheese. Nondairy creamers and whipped toppings. Processed cheese, cheese spreads, or cheese curds. Condiments Onion and garlic salt, seasoned salt, table salt, and sea salt. Canned and packaged gravies. Worcestershire sauce. Tartar sauce. Barbecue sauce. Teriyaki sauce. Soy sauce, including reduced sodium. Steak sauce. Fish sauce. Oyster sauce. Cocktail sauce. Horseradish. Ketchup and mustard. Meat flavorings and tenderizers. Bouillon cubes. Hot sauce. Tabasco sauce. Marinades. Taco seasonings. Relishes. Fats and Oils Butter, stick margarine, lard, shortening, ghee, and bacon fat. Coconut, palm kernel, or palm oils. Regular salad dressings. Other Pickles and olives. Salted popcorn and pretzels. The items listed above may not be a complete list of foods and beverages to avoid. Contact your dietitian for more information. WHERE CAN I FIND MORE  INFORMATION? National Heart, Lung, and Blood Institute: travelstabloid.com Document Released: 10/08/2011 Document Revised: 03/05/2014 Document Reviewed: 08/23/2013 [facility] Patient Information 2015 [place], [place]. This information is not intended to replace advice given to you by your health care provider. Make sure you discuss any questions you have with your health care provider.

## 2018-12-28 LAB — COMPLETE METABOLIC PANEL WITH GFR
AG RATIO: 2.2 (calc) (ref 1.0–2.5)
ALT: 19 U/L (ref 6–29)
AST: 21 U/L (ref 10–35)
Albumin: 5.1 g/dL (ref 3.6–5.1)
Alkaline phosphatase (APISO): 40 U/L (ref 37–153)
BUN: 16 mg/dL (ref 7–25)
CALCIUM: 10.7 mg/dL — AB (ref 8.6–10.4)
CHLORIDE: 104 mmol/L (ref 98–110)
CO2: 28 mmol/L (ref 20–32)
Creat: 0.93 mg/dL (ref 0.50–0.99)
GFR, Est African American: 73 mL/min/{1.73_m2} (ref >=60)
GFR, Est Non African American: 63 mL/min/{1.73_m2} (ref >=60)
GLOBULIN: 2.3 g/dL (ref 1.9–3.7)
Glucose, Bld: 89 mg/dL (ref 65–99)
POTASSIUM: 4.2 mmol/L (ref 3.5–5.3)
SODIUM: 142 mmol/L (ref 135–146)
TOTAL PROTEIN: 7.4 g/dL (ref 6.1–8.1)
Total Bilirubin: 0.6 mg/dL (ref 0.2–1.2)

## 2018-12-28 LAB — LIPID PANEL
CHOLESTEROL: 142 mg/dL (ref <200)
HDL: 74 mg/dL (ref >=50)
LDL Cholesterol (Calc): 54 mg/dL (calc)
Non-HDL Cholesterol (Calc): 68 mg/dL (calc) (ref <130)
Total CHOL/HDL Ratio: 1.9 (calc) (ref <5.0)
Triglycerides: 67 mg/dL (ref <150)

## 2018-12-28 LAB — CBC WITH DIFFERENTIAL/PLATELET
ABSOLUTE MONOCYTES: 342 {cells}/uL (ref 200–950)
BASOS PCT: 0.5 %
Basophils Absolute: 30 cells/uL (ref 0–200)
Eosinophils Absolute: 42 cells/uL (ref 15–500)
Eosinophils Relative: 0.7 %
HEMATOCRIT: 39.1 % (ref 35.0–45.0)
HEMOGLOBIN: 13.7 g/dL (ref 11.7–15.5)
LYMPHS ABS: 2226 {cells}/uL (ref 850–3900)
MCH: 30.9 pg (ref 27.0–33.0)
MCHC: 35 g/dL (ref 32.0–36.0)
MCV: 88.3 fL (ref 80.0–100.0)
MPV: 9.6 fL (ref 7.5–12.5)
Monocytes Relative: 5.7 %
Neutro Abs: 3360 cells/uL (ref 1500–7800)
Neutrophils Relative %: 56 %
Platelets: 319 10*3/uL (ref 140–400)
RBC: 4.43 10*6/uL (ref 3.80–5.10)
RDW: 12.3 % (ref 11.0–15.0)
Total Lymphocyte: 37.1 %
WBC: 6 10*3/uL (ref 3.8–10.8)

## 2018-12-28 LAB — TSH: TSH: 2.29 mIU/L (ref 0.40–4.50)

## 2018-12-28 LAB — VITAMIN D 25 HYDROXY (VIT D DEFICIENCY, FRACTURES): VIT D 25 HYDROXY: 77 ng/mL (ref 30–100)

## 2018-12-28 LAB — MAGNESIUM: Magnesium: 1.9 mg/dL (ref 1.5–2.5)

## 2019-02-08 ENCOUNTER — Other Ambulatory Visit: Payer: Self-pay | Admitting: Adult Health

## 2019-02-21 DIAGNOSIS — Z6826 Body mass index (BMI) 26.0-26.9, adult: Secondary | ICD-10-CM | POA: Diagnosis not present

## 2019-02-21 DIAGNOSIS — Z124 Encounter for screening for malignant neoplasm of cervix: Secondary | ICD-10-CM | POA: Diagnosis not present

## 2019-03-22 DIAGNOSIS — Z1231 Encounter for screening mammogram for malignant neoplasm of breast: Secondary | ICD-10-CM | POA: Diagnosis not present

## 2019-03-22 LAB — HM MAMMOGRAPHY

## 2019-03-29 ENCOUNTER — Encounter: Payer: Self-pay | Admitting: *Deleted

## 2019-05-18 DIAGNOSIS — M8588 Other specified disorders of bone density and structure, other site: Secondary | ICD-10-CM | POA: Diagnosis not present

## 2019-05-18 DIAGNOSIS — N958 Other specified menopausal and perimenopausal disorders: Secondary | ICD-10-CM | POA: Diagnosis not present

## 2019-08-15 ENCOUNTER — Encounter: Payer: Self-pay | Admitting: Internal Medicine

## 2019-08-15 ENCOUNTER — Other Ambulatory Visit: Payer: Self-pay | Admitting: Internal Medicine

## 2019-08-15 NOTE — Progress Notes (Signed)
Annual Screening/Preventative Visit & Comprehensive Evaluation &  Examination     This very nice 68 y.o. DWF presents for a Screening /Preventative Visit & comprehensive evaluation and management of multiple medical co-morbidities.  Patient has been followed for HTN, HLD, Prediabetes  and Vitamin D Deficiency. Patient has GERD controlled on her diet & meds.      HTN predates circa 2007 Patient's BP has been controlled at home and patient denies any cardiac symptoms as chest pain, palpitations, shortness of breath, dizziness or ankle swelling. Today's BP was elevated at 162/84 and rechecked x 3 at 170-180 / 90-100 (wall cuff).  Patient attributes this to "[place]" & desires to monitor at home for 2 weeks.      Patient's hyperlipidemia is controlled with diet and medications. Patient denies myalgias or other medication SE's. Last lipids were at goal:  Lab Results  Component Value Date   CHOL 142 12/27/2018   HDL 74 12/27/2018   LDLCALC 54 12/27/2018   TRIG 67 12/27/2018   CHOLHDL 1.9 12/27/2018      Patient has hx/o prediabetes (A1c 5.7% / 2012) and patient denies reactive hypoglycemic symptoms, visual blurring, diabetic polys or paresthesias. Last A1c was Normal & at goal:  Lab Results  Component Value Date   HGBA1C 5.4 08/01/2018      Patient was dx'd Hypothyroid in 2001 & started on replacement therapy.       Finally, patient has history of Vitamin D Deficiency  ("46" on tx / 2017)  and last Vitamin D was at goal:  Lab Results  Component Value Date   VD25OH 77 12/27/2018   Current Outpatient Medications on File Prior to Visit  Medication Sig  . beta carotene w/minerals (OCUVITE) tablet Take 1 tablet by mouth daily.  . Cholecalciferol (VITAMIN D3) 5000 units TABS Take 5,000 Units by mouth daily.  Marland Kitchen levothyroxine (SYNTHROID) 50 MCG tablet Take 1 tablet daily on an empty stomach with only water for 30 minutes & no Antacid meds, Calcium or Magnesium for 4 hours & avoid Biotin   . pantoprazole (PROTONIX) 40 MG tablet TAKE ONE TABLET BY MOUTH ONCE DAILY AS NEEDED FOR HEARTBURN AND REFLUX  . rosuvastatin (CRESTOR) 40 MG tablet Take 1/2 to 1 tablet daily or as directed for Cholesterol (Patient taking differently: Takes 1/2 tablet every other day)   No current facility-administered medications on file prior to visit.    Allergies  Allergen Reactions  . Codeine Nausea And Vomiting and Other (See Comments)    Migraine   Past Medical History:  Diagnosis Date  . Adenomatous colon polyp   . Allergy   . GERD (gastroesophageal reflux disease)   . Heart murmur    history of as teenager   . IBS (irritable bowel syndrome)   . Labile hypertension   . Macular degeneration of left eye   . RLS (restless legs syndrome)   . Thyroid disease    hypothyroid  . Vitamin D deficiency    Health Maintenance  Topic Date Due  . INFLUENZA VACCINE  06/03/2019  . MAMMOGRAM  03/21/2020  . COLONOSCOPY  09/21/2022  . TETANUS/TDAP  12/27/2028  . DEXA SCAN  Completed  . Hepatitis C Screening  Completed  . PNA vac Low Risk Adult  Completed   Immunization History  Administered Date(s) Administered  . Influenza, High Dose Seasonal PF 08/01/2018  . Influenza-Unspecified 07/29/2016  . Pneumococcal Conjugate-13 08/19/2015  . Pneumococcal Polysaccharide-23 11/09/2012, 08/01/2018  . Td 12/27/2018  . Tdap  10/18/2008   Last Colon - 09/21/2017 - Dr Henrene Pastor recc 5 yr f/u due Nov/Dec 2023  Last MGM - 03/22/2019  Past Surgical History:  Procedure Laterality Date  . COLONOSCOPY    . POLYPECTOMY    . TENDON RECONSTRUCTION Right 04/27/2013   Procedure: RIGHT ELBOW DEBRIDE/REPAIR/EXPLORE - LATERAL HUMERAL EPICONDYLE;  Surgeon: Ninetta Lights, MD;  Location: [facility];  Service: Orthopedics;  Laterality: Right;  . TUBAL LIGATION     Family History  Problem Relation Age of Onset  . Hypertension Father   . Cerebral aneurysm Father   . Alzheimer's disease Mother   . Stomach  cancer Paternal Grandmother 28  . Colon cancer Neg Hx   . Esophageal cancer Neg Hx   . Rectal cancer Neg Hx    Social History   Tobacco Use  . Smoking status: Former Smoker    Packs/day: 2.00    Years: 14.00    Pack years: 28.00    Start date: 11/02/1966    Quit date: 04/24/1981    Years since quitting: 38.3  . Smokeless tobacco: Never Used  Substance Use Topics  . Alcohol use: Yes    Alcohol/week: 3.0 standard drinks    Types: 3 Glasses of wine per week    Comment: occ  . Drug use: No    ROS Constitutional: Denies fever, chills, weight loss/gain, headaches, insomnia,  night sweats, and change in appetite. Does c/o fatigue. Eyes: Denies redness, blurred vision, diplopia, discharge, itchy, watery eyes.  ENT: Denies discharge, congestion, post nasal drip, epistaxis, sore throat, earache, hearing loss, dental pain, Tinnitus, Vertigo, Sinus pain, snoring.  Cardio: Denies chest pain, palpitations, irregular heartbeat, syncope, dyspnea, diaphoresis, orthopnea, PND, claudication, edema Respiratory: denies cough, dyspnea, DOE, pleurisy, hoarseness, laryngitis, wheezing.  Gastrointestinal: Denies dysphagia, heartburn, reflux, water brash, pain, cramps, nausea, vomiting, bloating, diarrhea, constipation, hematemesis, melena, hematochezia, jaundice, hemorrhoids Genitourinary: Denies dysuria, frequency, urgency, nocturia, hesitancy, discharge, hematuria, flank pain Breast: Breast lumps, nipple discharge, bleeding.  Musculoskeletal: Denies arthralgia, myalgia, stiffness, Jt. Swelling, pain, limp, and strain/sprain. Denies falls. Skin: Denies puritis, rash, hives, warts, acne, eczema, changing in skin lesion Neuro: No weakness, tremor, incoordination, spasms, paresthesia, pain Psychiatric: Denies confusion, memory loss, sensory loss. Denies Depression. Endocrine: Denies change in weight, skin, hair change, nocturia, and paresthesia, diabetic polys, visual blurring, hyper / hypo glycemic episodes.   Heme/Lymph: No excessive bleeding, bruising, enlarged lymph nodes.  Physical Exam  BP (!) 162/84   Pulse 72   Temp (!) 97 F (36.1 C)   Resp 16   Ht 5\' 3"  (1.6 m)   Wt 145 lb (65.8 kg)   BMI 25.69 kg/m   General Appearance: Well nourished, well groomed and in no apparent distress.  Eyes: PERRLA, EOMs, conjunctiva no swelling or erythema, normal fundi and vessels. Sinuses: No frontal/maxillary tenderness ENT/Mouth: EACs patent / TMs  nl. Nares clear without erythema, swelling, mucoid exudates. Oral hygiene is good. No erythema, swelling, or exudate. Tongue normal, non-obstructing. Tonsils not swollen or erythematous. Hearing normal.  Neck: Supple, thyroid not palpable. No bruits, nodes or JVD. Respiratory: Respiratory effort normal.  BS equal and clear bilateral without rales, rhonci, wheezing or stridor. Cardio: Heart sounds are normal with regular rate and rhythm and no murmurs, rubs or gallops. Peripheral pulses are normal and equal bilaterally without edema. No aortic or femoral bruits. Chest: symmetric with normal excursions and percussion. Breasts: Symmetric, without lumps, nipple discharge, retractions, or fibrocystic changes.  Abdomen: Flat, soft with bowel sounds  active. Nontender, no guarding, rebound, hernias, masses, or organomegaly.  Lymphatics: Non tender without lymphadenopathy.  Genitourinary:  Musculoskeletal: Full ROM all peripheral extremities, joint stability, 5/5 strength, and normal gait. Skin: Warm and dry without rashes, lesions, cyanosis, clubbing or  ecchymosis.  Neuro: Cranial nerves intact, reflexes equal bilaterally. Normal muscle tone, no cerebellar symptoms. Sensation intact.  Pysch: Alert and oriented X 3, normal affect, Insight and Judgment appropriate.   Assessment and Plan  1. Annual Preventative Screening Examination  2. Labile hypertension  - EKG 12-Lead - Urinalysis, Routine w reflex microscopic - Microalbumin / creatinine urine ratio -  CBC with Differential/Platelet - COMPLETE METABOLIC PANEL WITH GFR - Magnesium - TSH  3. Hyperlipidemia, mixed  - EKG 12-Lead - Lipid panel - TSH  4. Abnormal glucose  - EKG 12-Lead - Hemoglobin A1c - Insulin, random  5. Vitamin D deficiency  - VITAMIN D 25 Hydroxy  6. Prediabetes  - EKG 12-Lead - Hemoglobin A1c - Insulin, random  7. Hypothyroidism  - TSH  8. Encounter for colorectal cancer screening  - POC Hemoccult Bld/Stl  9. Screening for ischemic heart disease  - EKG 12-Lead  10. FH: hypertension  - EKG 12-Lead  11. Former smoker  - EKG 12-Lead  12. Medication management  - Urinalysis, Routine w reflex microscopic - Microalbumin / creatinine urine ratio - CBC with Differential/Platelet - COMPLETE METABOLIC PANEL WITH GFR - Magnesium - Lipid panel - TSH - Hemoglobin A1c - Insulin, random - VITAMIN D 25 Hydroxyl        Patient was counseled in prudent diet to achieve/maintain BMI less than 25 for weight control, BP monitoring, regular exercise and medications. Discussed med's effects and SE's. Screening labs and tests as requested with regular follow-up as recommended. Over 40 minutes of exam, counseling, chart review and high complex critical decision making was performed.   Kirtland Bouchard, MD

## 2019-08-15 NOTE — Patient Instructions (Signed)
Vit D  & Vit C 1,000 mg   are recommended to help protect  against the Covid-19 and other Corona viruses.    Also it's recommended  to take  Zinc 50 mg  to help  protect against the Covid-19   and best place to get  is also on [email or web address]  and don't pay more than 6-8 cents /pill !  ================================ Coronavirus (COVID-19) Are you at risk?  Are you at risk for the Coronavirus (COVID-19)?  To be considered HIGH RISK for Coronavirus (COVID-19), you have to meet the following criteria:  . Traveled to [place], [place], [place], [place] or [place]; or in the [place] to [place], [place], [place]  . or [place]; and have fever, cough, and shortness of breath within the last 2 weeks of travel OR . Been in close contact with a person diagnosed with COVID-19 within the last 2 weeks and have  . fever, cough,and shortness of breath .  . IF YOU DO NOT MEET THESE CRITERIA, YOU ARE CONSIDERED LOW RISK FOR COVID-19.  What to do if you are HIGH RISK for COVID-19?  Marland Kitchen If you are having a medical emergency, call 911. . Seek medical care right away. Before you go to a doctor's office, urgent care or emergency department, .  call ahead and tell them about your recent travel, contact with someone diagnosed with COVID-19  .  and your symptoms.  . You should receive instructions from your physician's office regarding next steps of care.  . When you arrive at healthcare provider, tell the healthcare staff immediately you have returned from  . visiting [place], [place], [place], [place] or [place]; or traveled in the [place] to [place], [place],  . [place] or [place] in the last two weeks or you have been in close contact with a person diagnosed with  . COVID-19 in the last 2 weeks.   . Tell the health care staff about your symptoms: fever, cough and shortness of breath. . After you have been seen by a medical provider, you will be either: o Tested for (COVID-19) and  discharged home on quarantine except to seek medical care if  o symptoms worsen, and asked to  - Stay home and avoid contact with others until you get your results (4-5 days)  - Avoid travel on public transportation if possible (such as bus, train, or airplane) or o Sent to the Emergency Department by EMS for evaluation, COVID-19 testing  and  o possible admission depending on your condition and test results.  What to do if you are LOW RISK for COVID-19?  Reduce your risk of any infection by using the same precautions used for avoiding the common cold or flu:  Marland Kitchen Wash your hands often with soap and warm water for at least 20 seconds.  If soap and water are not readily available,  . use an alcohol-based hand sanitizer with at least 60% alcohol.  . If coughing or sneezing, cover your mouth and nose by coughing or sneezing into the elbow areas of your shirt or coat, .  into a tissue or into your sleeve (not your hands). . Avoid shaking hands with others and consider head nods or verbal greetings only. . Avoid touching your eyes, nose, or mouth with unwashed hands.  . Avoid close contact with people who are sick. . Avoid places or events with large numbers of people in one location, like concerts or sporting events. Marland Kitchen  Carefully consider travel plans you have or are making. . If you are planning any travel outside or inside the [place], visit the CDC's Travelers' Health webpage for the latest health notices. . If you have some symptoms but not all symptoms, continue to monitor at home and seek medical attention  . if your symptoms worsen. . If you are having a medical emergency, call 911.   . >>>>>>>>>>>>>>>>>>>>>>>>>>>>>>>>> . We Do NOT Approve of  [facility], [employer]  Our Patients  To Do Home Visits & We Do NOT Approve of [employer] > > > > > > > > > > > > > > > > > > > > > > > > > > > > > > > > > > > > > > >  Preventive Care for Adults  A healthy lifestyle  and preventive care can promote health and wellness. Preventive health guidelines for women include the following key practices.  A routine yearly physical is a good way to check with your health care provider about your health and preventive screening. It is a chance to share any concerns and updates on your health and to receive a thorough exam.  Visit your dentist for a routine exam and preventive care every 6 months. Brush your teeth twice a day and floss once a day. Good oral hygiene prevents tooth decay and gum disease.  The frequency of eye exams is based on your age, health, family medical history, use of contact lenses, and other factors. Follow your health care provider's recommendations for frequency of eye exams.  Eat a healthy diet. Foods like vegetables, fruits, whole grains, low-fat dairy products, and lean protein foods contain the nutrients you need without too many calories. Decrease your intake of foods high in solid fats, added sugars, and salt. Eat the right amount of calories for you. Get information about a proper diet from your health care provider, if necessary.  Regular physical exercise is one of the most important things you can do for your health. Most adults should get at least 150 minutes of moderate-intensity exercise (any activity that increases your heart rate and causes you to sweat) each week. In addition, most adults need muscle-strengthening exercises on 2 or more days a week.  Maintain a healthy weight. The body mass index (BMI) is a screening tool to identify possible weight problems. It provides an estimate of body fat based on height and weight. Your health care provider can find your BMI and can help you achieve or maintain a healthy weight. For adults 20 years and older:  A BMI below 18.5 is considered underweight.  A BMI of 18.5 to 24.9 is normal.  A BMI of 25 to 29.9 is considered overweight.  A BMI of 30 and above is considered obese.  Maintain  normal blood lipids and cholesterol levels by exercising and minimizing your intake of saturated fat. Eat a balanced diet with plenty of fruit and vegetables. If your lipid or cholesterol levels are high, you are over 50, or you are at high risk for heart disease, you may need your cholesterol levels checked more frequently. Ongoing high lipid and cholesterol levels should be treated with medicines if diet and exercise are not working.  If you smoke, find out from your health care provider how to quit. If you do not use tobacco, do not start.  Lung cancer screening is recommended for adults aged 34-80 years who are at high risk for developing lung cancer  because of a history of smoking. A yearly low-dose CT scan of the lungs is recommended for people who have at least a 30-pack-year history of smoking and are a current smoker or have quit within the past 15 years. A pack year of smoking is smoking an average of 1 pack of cigarettes a day for 1 year (for example: 1 pack a day for 30 years or 2 packs a day for 15 years). Yearly screening should continue until the smoker has stopped smoking for at least 15 years. Yearly screening should be stopped for people who develop a health problem that would prevent them from having lung cancer treatment.  Avoid use of street drugs. Do not share needles with anyone. Ask for help if you need support or instructions about stopping the use of drugs.  High blood pressure causes heart disease and increases the risk of stroke.  Ongoing high blood pressure should be treated with medicines if weight loss and exercise do not work.  If you are 66-18 years old, ask your health care provider if you should take aspirin to prevent strokes.  Diabetes screening involves taking a blood sample to check your fasting blood sugar level. This should be done once every 3 years, after age 35, if you are within normal weight and without risk factors for diabetes. Testing should be considered  at a younger age or be carried out more frequently if you are overweight and have at least 1 risk factor for diabetes.  Breast cancer screening is essential preventive care for women. You should practice "breast self-awareness." This means understanding the normal appearance and feel of your breasts and may include breast self-examination. Any changes detected, no matter how small, should be reported to a health care provider. Women in their 74s and 30s should have a clinical breast exam (CBE) by a health care provider as part of a regular health exam every 1 to 3 years. After age 5, women should have a CBE every year. Starting at age 52, women should consider having a mammogram (breast X-ray test) every year. Women who have a family history of breast cancer should talk to their health care provider about genetic screening. Women at a high risk of breast cancer should talk to their health care providers about having an MRI and a mammogram every year.  Breast cancer gene (BRCA)-related cancer risk assessment is recommended for women who have family members with BRCA-related cancers. BRCA-related cancers include breast, ovarian, tubal, and peritoneal cancers. Having family members with these cancers may be associated with an increased risk for harmful changes (mutations) in the breast cancer genes BRCA1 and BRCA2. Results of the assessment will determine the need for genetic counseling and BRCA1 and BRCA2 testing.  Routine pelvic exams to screen for cancer are no longer recommended for nonpregnant women who are considered low risk for cancer of the pelvic organs (ovaries, uterus, and vagina) and who do not have symptoms. Ask your health care provider if a screening pelvic exam is right for you.  If you have had past treatment for cervical cancer or a condition that could lead to cancer, you need Pap tests and screening for cancer for at least 20 years after your treatment. If Pap tests have been discontinued,  your risk factors (such as having a new sexual partner) need to be reassessed to determine if screening should be resumed. Some women have medical problems that increase the chance of getting cervical cancer. In these cases, your health care  provider may recommend more frequent screening and Pap tests.    Colorectal cancer can be detected and often prevented. Most routine colorectal cancer screening begins at the age of 12 years and continues through age 30 years. However, your health care provider may recommend screening at an earlier age if you have risk factors for colon cancer. On a yearly basis, your health care provider may provide home test kits to check for hidden blood in the stool. Use of a small camera at the end of a tube, to directly examine the colon (sigmoidoscopy or colonoscopy), can detect the earliest forms of colorectal cancer. Talk to your health care provider about this at age 20, when routine screening begins.  Direct exam of the colon should be repeated every 5-10 years through age 68 years, unless early forms of pre-cancerous polyps or small growths are found.  Osteoporosis is a disease in which the bones lose minerals and strength with aging. This can result in serious bone fractures or breaks. The risk of osteoporosis can be identified using a bone density scan. Women ages 11 years and over and women at risk for fractures or osteoporosis should discuss screening with their health care providers. Ask your health care provider whether you should take a calcium supplement or vitamin D to reduce the rate of osteoporosis.  Menopause can be associated with physical symptoms and risks. Hormone replacement therapy is available to decrease symptoms and risks. You should talk to your health care provider about whether hormone replacement therapy is right for you.  Use sunscreen. Apply sunscreen liberally and repeatedly throughout the day. You should seek shade when your shadow is shorter  than you. Protect yourself by wearing long sleeves, pants, a wide-brimmed hat, and sunglasses year round, whenever you are outdoors.  Once a month, do a whole body skin exam, using a mirror to look at the skin on your back. Tell your health care provider of new moles, moles that have irregular borders, moles that are larger than a pencil eraser, or moles that have changed in shape or color.  Stay current with required vaccines (immunizations).  Influenza vaccine. All adults should be immunized every year.  Tetanus, diphtheria, and acellular pertussis (Td, Tdap) vaccine. Pregnant women should receive 1 dose of Tdap vaccine during each pregnancy. The dose should be obtained regardless of the length of time since the last dose. Immunization is preferred during the 27th-36th week of gestation. An adult who has not previously received Tdap or who does not know her vaccine status should receive 1 dose of Tdap. This initial dose should be followed by tetanus and diphtheria toxoids (Td) booster doses every 10 years. Adults with an unknown or incomplete history of completing a 3-dose immunization series with Td-containing vaccines should begin or complete a primary immunization series including a Tdap dose. Adults should receive a Td booster every 10 years.    Zoster vaccine. One dose is recommended for adults aged 25 years or older unless certain conditions are present.    Pneumococcal 13-valent conjugate (PCV13) vaccine. When indicated, a person who is uncertain of her immunization history and has no record of immunization should receive the PCV13 vaccine. An adult aged 77 years or older who has certain medical conditions and has not been previously immunized should receive 1 dose of PCV13 vaccine. This PCV13 should be followed with a dose of pneumococcal polysaccharide (PPSV23) vaccine. The PPSV23 vaccine dose should be obtained at least 1 or more year(s) after the dose of  PCV13 vaccine. An adult aged 68  years or older who has certain medical conditions and previously received 1 or more doses of PPSV23 vaccine should receive 1 dose of PCV13. The PCV13 vaccine dose should be obtained 1 or more years after the last PPSV23 vaccine dose.    Pneumococcal polysaccharide (PPSV23) vaccine. When PCV13 is also indicated, PCV13 should be obtained first. All adults aged 61 years and older should be immunized. An adult younger than age 57 years who has certain medical conditions should be immunized. Any person who resides in a nursing home or long-term care facility should be immunized. An adult smoker should be immunized. People with an immunocompromised condition and certain other conditions should receive both PCV13 and PPSV23 vaccines. People with human immunodeficiency virus (HIV) infection should be immunized as soon as possible after diagnosis. Immunization during chemotherapy or radiation therapy should be avoided. Routine use of PPSV23 vaccine is not recommended for American Indians, [place], or people younger than 65 years unless there are medical conditions that require PPSV23 vaccine. When indicated, people who have unknown immunization and have no record of immunization should receive PPSV23 vaccine. One-time revaccination 5 years after the first dose of PPSV23 is recommended for people aged 19-64 years who have chronic kidney failure, nephrotic syndrome, asplenia, or immunocompromised conditions. People who received 1-2 doses of PPSV23 before age 30 years should receive another dose of PPSV23 vaccine at age 85 years or later if at least 5 years have passed since the previous dose. Doses of PPSV23 are not needed for people immunized with PPSV23 at or after age 57 years.   Preventive Services / Frequency  Ages 77 years and over  Blood pressure check.  Lipid and cholesterol check.  Lung cancer screening. / Every year if you are aged 69-80 years and have a 30-pack-year history of smoking and  currently smoke or have quit within the past 15 years. Yearly screening is stopped once you have quit smoking for at least 15 years or develop a health problem that would prevent you from having lung cancer treatment.  Clinical breast exam.** / Every year after age 1 years.   BRCA-related cancer risk assessment.** / For women who have family members with a BRCA-related cancer (breast, ovarian, tubal, or peritoneal cancers).  Mammogram.** / Every year beginning at age 74 years and continuing for as long as you are in good health. Consult with your health care provider.  Pap test.** / Every 3 years starting at age 59 years through age 36 or 61 years with 3 consecutive normal Pap tests. Testing can be stopped between 65 and 70 years with 3 consecutive normal Pap tests and no abnormal Pap or HPV tests in the past 10 years.  Fecal occult blood test (FOBT) of stool. / Every year beginning at age 69 years and continuing until age 70 years. You may not need to do this test if you get a colonoscopy every 10 years.  Flexible sigmoidoscopy or colonoscopy.** / Every 5 years for a flexible sigmoidoscopy or every 10 years for a colonoscopy beginning at age 58 years and continuing until age 64 years.  Hepatitis C blood test.** / For all people born from [birth year] through 1966 and any individual with known risks for hepatitis C.  Osteoporosis screening.** / A one-time screening for women ages 70 years and over and women at risk for fractures or osteoporosis.  Skin self-exam. / Monthly.  Influenza vaccine. / Every year.  Tetanus, diphtheria, and acellular  pertussis (Tdap/Td) vaccine.** / 1 dose of Td every 10 years.  Zoster vaccine.** / 1 dose for adults aged 60 years or older.  Pneumococcal 13-valent conjugate (PCV13) vaccine.** / Consult your health care provider.  Pneumococcal polysaccharide (PPSV23) vaccine.** / 1 dose for all adults aged 65 years and older. Screening for abdominal aortic aneurysm (AAA)   by ultrasound is recommended for people who have history of high blood pressure or who are current or former smokers. ++++++++++++++++++++ Recommend Adult Low Dose Aspirin or  coated  Aspirin 81 mg daily  To reduce risk of Colon Cancer 40 %,  Skin Cancer 26 % ,  Melanoma 46%  and  Pancreatic cancer 60% ++++++++++++++++++++ Vitamin D goal  is between 70-100.  Please make sure that you are taking your Vitamin D as directed.  It is very important as a natural anti-inflammatory  helping hair, skin, and nails, as well as reducing stroke and heart attack risk.  It helps your bones and helps with mood. It also decreases numerous cancer risks so please take it as directed.  Low Vit D is associated with a 200-300% higher risk for CANCER  and 200-300% higher risk for HEART   ATTACK  &  STROKE.   ...................................... It is also associated with higher death rate at younger ages,  autoimmune diseases like Rheumatoid arthritis, Lupus, Multiple Sclerosis.    Also many other serious conditions, like depression, Alzheimer's Dementia, infertility, muscle aches, fatigue, fibromyalgia - just to name a few. ++++++++++++++++++ Recommend the book "The END of DIETING" by Dr Joel Fuhrman  & the book "The END of DIABETES " by Dr Joel Fuhrman At [employer] - get book & Audio CD's    Being diabetic has a  300% increased risk for heart attack, stroke, cancer, and alzheimer- type vascular dementia. It is very important that you work harder with diet by avoiding all foods that are white. Avoid white rice (brown & wild rice is OK), white potatoes (sweetpotatoes in moderation is OK), White bread or wheat bread or anything made out of white flour like bagels, donuts, rolls, buns, biscuits, cakes, pastries, cookies, pizza crust, and pasta (made from white flour & egg whites) - vegetarian pasta or spinach or wheat pasta is OK. Multigrain breads like Arnold's or [employer], or multigrain sandwich thins  or flatbreads.  Diet, exercise and weight loss can reverse and cure diabetes in the early stages.  Diet, exercise and weight loss is very important in the control and prevention of complications of diabetes which affects every system in your body, ie. Brain - dementia/stroke, eyes - glaucoma/blindness, heart - heart attack/heart failure, kidneys - dialysis, stomach - gastric paralysis, intestines - malabsorption, nerves - severe painful neuritis, circulation - gangrene & loss of a leg(s), and finally cancer and Alzheimers.    I recommend avoid fried & greasy foods,  sweets/candy, white rice (brown or wild rice or Quinoa is OK), white potatoes (sweet potatoes are OK) - anything made from white flour - bagels, doughnuts, rolls, buns, biscuits,white and wheat breads, pizza crust and traditional pasta made of white flour & egg white(vegetarian pasta or spinach or wheat pasta is OK).  Multi-grain bread is OK - like multi-grain flat bread or sandwich thins. Avoid alcohol in excess. Exercise is also important.    Eat all the vegetables you want - avoid meat, especially red meat and dairy - especially cheese.  Cheese is the most concentrated form of trans-fats which is the worst thing to clog   up our arteries. Veggie cheese is OK which can be found in the fresh produce section at [employer] or [employer] or [employer]  +++++++++++++++++++ DASH Eating Plan  DASH stands for "Dietary Approaches to Stop Hypertension."   The DASH eating plan is a healthy eating plan that has been shown to reduce high blood pressure (hypertension). Additional health benefits may include reducing the risk of type 2 diabetes mellitus, heart disease, and stroke. The DASH eating plan may also help with weight loss. WHAT DO I NEED TO KNOW ABOUT THE DASH EATING PLAN? For the DASH eating plan, you will follow these general guidelines:  Choose foods with a percent daily value for sodium of less than 5% (as listed on the food  label).  Use salt-free seasonings or herbs instead of table salt or sea salt.  Check with your health care provider or pharmacist before using salt substitutes.  Eat lower-sodium products, often labeled as "lower sodium" or "no salt added."  Eat fresh foods.  Eat more vegetables, fruits, and low-fat dairy products.  Choose whole grains. Look for the word "whole" as the first word in the ingredient list.  Choose fish   Limit sweets, desserts, sugars, and sugary drinks.  Choose heart-healthy fats.  Eat veggie cheese   Eat more home-cooked food and less restaurant, buffet, and fast food.  Limit fried foods.  Cook foods using methods other than frying.  Limit canned vegetables. If you do use them, rinse them well to decrease the sodium.  When eating at a restaurant, ask that your food be prepared with less salt, or no salt if possible.                      WHAT FOODS CAN I EAT? Read Dr Fara Olden Fuhrman's books on The End of Dieting & The End of Diabetes  Grains Whole grain or whole wheat bread. Brown rice. Whole grain or whole wheat pasta. Quinoa, bulgur, and whole grain cereals. Low-sodium cereals. Corn or whole wheat flour tortillas. Whole grain cornbread. Whole grain crackers. Low-sodium crackers.  Vegetables Fresh or frozen vegetables (raw, steamed, roasted, or grilled). Low-sodium or reduced-sodium tomato and vegetable juices. Low-sodium or reduced-sodium tomato sauce and paste. Low-sodium or reduced-sodium canned vegetables.   Fruits All fresh, canned (in natural juice), or frozen fruits.  Protein Products  All fish and seafood.  Dried beans, peas, or lentils. Unsalted nuts and seeds. Unsalted canned beans.  Dairy Low-fat dairy products, such as skim or 1% milk, 2% or reduced-fat cheeses, low-fat ricotta or cottage cheese, or plain low-fat yogurt. Low-sodium or reduced-sodium cheeses.  Fats and Oils Tub margarines without trans fats. Light or reduced-fat mayonnaise  and salad dressings (reduced sodium). Avocado. Safflower, olive, or canola oils. Natural peanut or almond butter.  Other Unsalted popcorn and pretzels. The items listed above may not be a complete list of recommended foods or beverages. Contact your dietitian for more options.  +++++++++++++++  WHAT FOODS ARE NOT RECOMMENDED? Grains/ White flour or wheat flour White bread. White pasta. White rice. Refined cornbread. Bagels and croissants. Crackers that contain trans fat.  Vegetables  Creamed or fried vegetables. Vegetables in a . Regular canned vegetables. Regular canned tomato sauce and paste. Regular tomato and vegetable juices.  Fruits Dried fruits. Canned fruit in light or heavy syrup. Fruit juice.  Meat and Other Protein Products Meat in general - RED meat & White meat.  Fatty cuts of meat. Ribs, chicken wings, all processed meats as  bacon, sausage, bologna, salami, fatback, hot dogs, bratwurst and packaged luncheon meats.  Dairy Whole or 2% milk, cream, half-and-half, and cream cheese. Whole-fat or sweetened yogurt. Full-fat cheeses or blue cheese. Non-dairy creamers and whipped toppings. Processed cheese, cheese spreads, or cheese curds.  Condiments Onion and garlic salt, seasoned salt, table salt, and sea salt. Canned and packaged gravies. Worcestershire sauce. Tartar sauce. Barbecue sauce. Teriyaki sauce. Soy sauce, including reduced sodium. Steak sauce. Fish sauce. Oyster sauce. Cocktail sauce. Horseradish. Ketchup and mustard. Meat flavorings and tenderizers. Bouillon cubes. Hot sauce. Tabasco sauce. Marinades. Taco seasonings. Relishes.  Fats and Oils Butter, stick margarine, lard, shortening and bacon fat. Coconut, palm kernel, or palm oils. Regular salad dressings.  Pickles and olives. Salted popcorn and pretzels.  The items listed above may not be a complete list of foods and beverages to avoid.

## 2019-08-16 ENCOUNTER — Other Ambulatory Visit: Payer: Self-pay

## 2019-08-16 ENCOUNTER — Ambulatory Visit (INDEPENDENT_AMBULATORY_CARE_PROVIDER_SITE_OTHER): Payer: PPO | Admitting: Internal Medicine

## 2019-08-16 VITALS — BP 162/84 | HR 72 | Temp 97.0°F | Resp 16 | Ht 63.0 in | Wt 145.0 lb

## 2019-08-16 DIAGNOSIS — Z87891 Personal history of nicotine dependence: Secondary | ICD-10-CM | POA: Diagnosis not present

## 2019-08-16 DIAGNOSIS — Z0000 Encounter for general adult medical examination without abnormal findings: Secondary | ICD-10-CM

## 2019-08-16 DIAGNOSIS — R0989 Other specified symptoms and signs involving the circulatory and respiratory systems: Secondary | ICD-10-CM | POA: Diagnosis not present

## 2019-08-16 DIAGNOSIS — I10 Essential (primary) hypertension: Secondary | ICD-10-CM | POA: Diagnosis not present

## 2019-08-16 DIAGNOSIS — Z8249 Family history of ischemic heart disease and other diseases of the circulatory system: Secondary | ICD-10-CM

## 2019-08-16 DIAGNOSIS — Z0001 Encounter for general adult medical examination with abnormal findings: Secondary | ICD-10-CM

## 2019-08-16 DIAGNOSIS — Z79899 Other long term (current) drug therapy: Secondary | ICD-10-CM

## 2019-08-16 DIAGNOSIS — E782 Mixed hyperlipidemia: Secondary | ICD-10-CM | POA: Diagnosis not present

## 2019-08-16 DIAGNOSIS — Z1211 Encounter for screening for malignant neoplasm of colon: Secondary | ICD-10-CM

## 2019-08-16 DIAGNOSIS — Z136 Encounter for screening for cardiovascular disorders: Secondary | ICD-10-CM | POA: Diagnosis not present

## 2019-08-16 DIAGNOSIS — R7309 Other abnormal glucose: Secondary | ICD-10-CM

## 2019-08-16 DIAGNOSIS — E039 Hypothyroidism, unspecified: Secondary | ICD-10-CM | POA: Diagnosis not present

## 2019-08-16 DIAGNOSIS — R7303 Prediabetes: Secondary | ICD-10-CM | POA: Diagnosis not present

## 2019-08-16 DIAGNOSIS — Z1212 Encounter for screening for malignant neoplasm of rectum: Secondary | ICD-10-CM

## 2019-08-16 DIAGNOSIS — E559 Vitamin D deficiency, unspecified: Secondary | ICD-10-CM

## 2019-08-17 LAB — HEMOGLOBIN A1C
Hgb A1c MFr Bld: 5.3 % of total Hgb (ref <5.7)
Mean Plasma Glucose: 105 (calc)
eAG (mmol/L): 5.8 (calc)

## 2019-08-17 LAB — COMPLETE METABOLIC PANEL WITH GFR
AG Ratio: 2 (calc) (ref 1.0–2.5)
ALT: 20 U/L (ref 6–29)
AST: 23 U/L (ref 10–35)
Albumin: 4.8 g/dL (ref 3.6–5.1)
Alkaline phosphatase (APISO): 34 U/L — ABNORMAL LOW (ref 37–153)
BUN: 15 mg/dL (ref 7–25)
CO2: 29 mmol/L (ref 20–32)
Calcium: 10.3 mg/dL (ref 8.6–10.4)
Chloride: 107 mmol/L (ref 98–110)
Creat: 0.84 mg/dL (ref 0.50–0.99)
GFR, Est African American: 82 mL/min/{1.73_m2} (ref >=60)
GFR, Est Non African American: 71 mL/min/{1.73_m2} (ref >=60)
Globulin: 2.4 g/dL (calc) (ref 1.9–3.7)
Glucose, Bld: 96 mg/dL (ref 65–99)
Potassium: 4.6 mmol/L (ref 3.5–5.3)
Sodium: 143 mmol/L (ref 135–146)
Total Bilirubin: 0.6 mg/dL (ref 0.2–1.2)
Total Protein: 7.2 g/dL (ref 6.1–8.1)

## 2019-08-17 LAB — URINALYSIS, ROUTINE W REFLEX MICROSCOPIC
Bilirubin Urine: NEGATIVE
Glucose, UA: NEGATIVE
Hgb urine dipstick: NEGATIVE
Ketones, ur: NEGATIVE
Leukocytes,Ua: NEGATIVE
Nitrite: NEGATIVE
Protein, ur: NEGATIVE
Specific Gravity, Urine: 1.018 (ref 1.001–1.03)
pH: <=5 (ref 5.0–8.0)

## 2019-08-17 LAB — CBC WITH DIFFERENTIAL/PLATELET
Absolute Monocytes: 269 cells/uL (ref 200–950)
Basophils Absolute: 29 cells/uL (ref 0–200)
Basophils Relative: 0.6 %
Eosinophils Absolute: 29 cells/uL (ref 15–500)
Eosinophils Relative: 0.6 %
HCT: 39.2 % (ref 35.0–45.0)
Hemoglobin: 13.4 g/dL (ref 11.7–15.5)
Lymphs Abs: 1834 cells/uL (ref 850–3900)
MCH: 31.4 pg (ref 27.0–33.0)
MCHC: 34.2 g/dL (ref 32.0–36.0)
MCV: 91.8 fL (ref 80.0–100.0)
MPV: 9.6 fL (ref 7.5–12.5)
Monocytes Relative: 5.6 %
Neutro Abs: 2640 cells/uL (ref 1500–7800)
Neutrophils Relative %: 55 %
Platelets: 299 10*3/uL (ref 140–400)
RBC: 4.27 10*6/uL (ref 3.80–5.10)
RDW: 12.7 % (ref 11.0–15.0)
Total Lymphocyte: 38.2 %
WBC: 4.8 10*3/uL (ref 3.8–10.8)

## 2019-08-17 LAB — MICROALBUMIN / CREATININE URINE RATIO
Creatinine, Urine: 112 mg/dL (ref 20–275)
Microalb Creat Ratio: 4 mcg/mg creat (ref <30)
Microalb, Ur: 0.4 mg/dL

## 2019-08-17 LAB — TSH: TSH: 1.76 mIU/L (ref 0.40–4.50)

## 2019-08-17 LAB — LIPID PANEL
Cholesterol: 136 mg/dL (ref <200)
HDL: 70 mg/dL (ref >=50)
LDL Cholesterol (Calc): 49 mg/dL (calc)
Non-HDL Cholesterol (Calc): 66 mg/dL (calc) (ref <130)
Total CHOL/HDL Ratio: 1.9 (calc) (ref <5.0)
Triglycerides: 85 mg/dL (ref <150)

## 2019-08-17 LAB — INSULIN, RANDOM: Insulin: 5.4 u[IU]/mL

## 2019-08-17 LAB — MAGNESIUM: Magnesium: 1.9 mg/dL (ref 1.5–2.5)

## 2019-08-17 LAB — VITAMIN D 25 HYDROXY (VIT D DEFICIENCY, FRACTURES): Vit D, 25-Hydroxy: 68 ng/mL (ref 30–100)

## 2019-08-30 NOTE — Progress Notes (Signed)
  History of Present Illness:     This very nice 68 yo DWF returns for 2 week f/u from recent CPE with BP's elevated at 162/84 and rechecked x 3 at 170-180 / 90-100.  She has monitored BPs at home and [id number] of Systolic BP's are less than the 120's     Medications  .  levothyroxine (SYNTHROID) 50 MCG tablet, Take 1 tablet daily on an empty stomach with only water for 30 minutes & no Antacid meds, Calcium or Magnesium for 4 hours & avoid Biotin .  rosuvastatin (CRESTOR) 40 MG tablet, Take 1/2 to 1 tablet daily or as directed for Cholesterol (Patient taking differently: Takes 1/2 tablet every other day) .  beta carotene w/minerals (OCUVITE) tablet, Take 1 tablet by mouth daily. .  Cholecalciferol (VITAMIN D3) 5000 units TABS, Take 5,000 Units by mouth daily. .  pantoprazole (PROTONIX) 40 MG tablet, TAKE ONE TABLET BY MOUTH ONCE DAILY AS NEEDED FOR HEARTBURN AND REFLUX  Problem list She has GERD (gastroesophageal reflux disease); Macular degeneration of left eye; IBS (irritable bowel syndrome); RLS (restless legs syndrome); Vitamin D deficiency; Mixed hyperlipidemia; Other abnormal glucose; Hypothyroidism; Medication management; Encounter for Medicare annual wellness exam; and Osteopenia on their problem list.   Observations/Objective:  BP  164/94  X 2   P 64   T  97.8 F   R 16   Ht 5\' 3"    Wt 145 lb   BMI 25.76   HEENT - WNL. Neck - supple.  Chest - Clear equal BS. Cor - Nl HS. RRR w/o sig MGR. PP 1(+). No edema. MS- FROM w/o deformities.  Gait Nl. Neuro -  Nl w/o focal abnormalities.  Assessment and Plan:  1. Labile hypertension   - patient prefers to continue self monitoring of BP's   Follow Up Instructions:       I discussed the assessment and treatment plan with the patient. The patient was provided an opportunity to ask questions and all were answered. The patient agreed with the plan and demonstrated an understanding of the instructions.       The patient was advised to  call back or seek an in-person evaluation if the BP's remain elevated or if the condition fails to improve as anticipated.   Kirtland Bouchard, MD\

## 2019-08-31 ENCOUNTER — Other Ambulatory Visit: Payer: Self-pay

## 2019-08-31 ENCOUNTER — Ambulatory Visit (INDEPENDENT_AMBULATORY_CARE_PROVIDER_SITE_OTHER): Payer: PPO | Admitting: Internal Medicine

## 2019-08-31 VITALS — BP 164/94 | HR 64 | Temp 97.8°F | Resp 16 | Ht 63.0 in | Wt 145.4 lb

## 2019-08-31 DIAGNOSIS — R0989 Other specified symptoms and signs involving the circulatory and respiratory systems: Secondary | ICD-10-CM | POA: Diagnosis not present

## 2019-09-03 ENCOUNTER — Encounter: Payer: Self-pay | Admitting: Internal Medicine

## 2019-11-02 ENCOUNTER — Other Ambulatory Visit: Payer: Self-pay | Admitting: *Deleted

## 2019-11-02 ENCOUNTER — Other Ambulatory Visit: Payer: Self-pay | Admitting: Internal Medicine

## 2019-12-04 ENCOUNTER — Encounter: Payer: Self-pay | Admitting: Internal Medicine

## 2019-12-04 NOTE — Progress Notes (Signed)
History of Present Illness:      This very nice 69 y.o. DWF presents for 3 month follow up with HTN, HLD, Pre-Diabetes and Vitamin D Deficiency.       Patient is followed expectantly for HTN (2007)  & BP has not been monitored at home. Todays BP is at goal - 124/78, but was rechecked at wrist & wall at 119-120/100-102. Patient has had no complaints of any cardiac type chest pain, palpitations, dyspnea / orthopnea / PND, dizziness, claudication, or dependent edema.     Hyperlipidemia is controlled with diet & Crestor 40 x 1/2 tab = 20 mg qod. Patient denies myalgias or other med SEs. Last Lipids were at goal:  Lab Results  Component Value Date   CHOL 136 08/16/2019   HDL 70 08/16/2019   LDLCALC 49 08/16/2019   TRIG 85 08/16/2019   CHOLHDL 1.9 08/16/2019        Also, the patient has history of PreDiabetes (A1c 5.7% / 2012) and has had no symptoms of reactive hypoglycemia, diabetic polys, paresthesias or visual blurring.  Last A1c was Normal & at goal:  Lab Results  Component Value Date   HGBA1C 5.3 08/16/2019       Patient was dx'd Hypothyroid in 2001 & started on replacement therapy.        Further, the patient also has history of Vitamin D Deficiency ("46" on tx / 2017) and supplements vitamin D without any suspected side-effects. Last vitamin D was at goal:  Lab Results  Component Value Date   VD25OH 68 08/16/2019    Current Outpatient Medications on File Prior to Visit  Medication Sig   beta carotene w/minerals (OCUVITE) tablet Take 1 tablet by mouth daily.   Cholecalciferol (VITAMIN D3) 5000 units TABS Take 5,000 Units by mouth daily.   levothyroxine (SYNTHROID) 50 MCG tablet Take 1 tablet daily on an empty stomach with only water for 30 minutes & no Antacid meds, Calcium or Magnesium for 4 hours & avoid Biotin   pantoprazole (PROTONIX) 40 MG tablet TAKE 1 TABLET BY MOUTH ONCE DAILY AS NEEDED FOR  HEARTBURN  AND  REFLUX   rosuvastatin (CRESTOR) 40 MG tablet  Take 1/2 to 1 tablet daily or as directed for Cholesterol (Patient taking differently: Takes 1/2 tablet every other day)   No current facility-administered medications on file prior to visit.    Allergies  Allergen Reactions   Codeine Nausea And Vomiting and Other (See Comments)    Migraine    PMHx:   Past Medical History:  Diagnosis Date   Adenomatous colon polyp    Allergy    GERD (gastroesophageal reflux disease)    Heart murmur    history of as teenager    IBS (irritable bowel syndrome)    Labile hypertension    Macular degeneration of left eye    RLS (restless legs syndrome)    Thyroid disease    hypothyroid   Vitamin D deficiency    Immunization History  Administered Date(s) Administered   Influenza, High Dose Seasonal PF 08/01/2018   Influenza-Unspecified 07/29/2016   Moderna SARS-COVID-2 Vaccination 11/29/2019   Pneumococcal Conjugate-13 08/19/2015   Pneumococcal Polysaccharide-23 11/09/2012, 08/01/2018   Td 12/27/2018   Tdap 10/18/2008   Past Surgical History:  Procedure Laterality Date   COLONOSCOPY     POLYPECTOMY     TENDON RECONSTRUCTION Right 04/27/2013   Procedure: RIGHT ELBOW DEBRIDE/REPAIR/EXPLORE - LATERAL HUMERAL EPICONDYLE;  Surgeon: Ninetta Lights, MD;  Location: [facility];  Service: Orthopedics;  Laterality: Right;   TUBAL LIGATION      FHx:    Reviewed / unchanged  SHx:    Reviewed / unchanged   Systems Review:  Constitutional: Denies fever, chills, wt changes, headaches, insomnia, fatigue, night sweats, change in appetite. Eyes: Denies redness, blurred vision, diplopia, discharge, itchy, watery eyes.  ENT: Denies discharge, congestion, post nasal drip, epistaxis, sore throat, earache, hearing loss, dental pain, tinnitus, vertigo, sinus pain, snoring.  CV: Denies chest pain, palpitations, irregular heartbeat, syncope, dyspnea, diaphoresis, orthopnea, PND, claudication or edema. Respiratory: denies  cough, dyspnea, DOE, pleurisy, hoarseness, laryngitis, wheezing.  Gastrointestinal: Denies dysphagia, odynophagia, heartburn, reflux, water brash, abdominal pain or cramps, nausea, vomiting, bloating, diarrhea, constipation, hematemesis, melena, hematochezia  or hemorrhoids. Genitourinary: Denies dysuria, frequency, urgency, nocturia, hesitancy, discharge, hematuria or flank pain. Musculoskeletal: Denies arthralgias, myalgias, stiffness, jt. swelling, pain, limping or strain/sprain.  Skin: Denies pruritus, rash, hives, warts, acne, eczema or change in skin lesion(s). Neuro: No weakness, tremor, incoordination, spasms, paresthesia or pain. Psychiatric: Denies confusion, memory loss or sensory loss. Endo: Denies change in weight, skin or hair change.  Heme/Lymph: No excessive bleeding, bruising or enlarged lymph nodes.  Physical Exam  BP 124/78    Pulse 64    Temp (!) 97 F (36.1 C)    Resp 16    Ht 5\' 3"  (1.6 m)    Wt 144 lb 12.8 oz (65.7 kg)    BMI 25.65 kg/m   BP rechecked at 119-120 / 119-120 at wrist & wall  Appears  well nourished, well groomed  and in no distress.  Eyes: PERRLA, EOMs, conjunctiva no swelling or erythema. Sinuses: No frontal/maxillary tenderness ENT/Mouth: EAC's clear, TM's nl w/o erythema, bulging. Nares clear w/o erythema, swelling, exudates. Oropharynx clear without erythema or exudates. Oral hygiene is good. Tongue normal, non obstructing. Hearing intact.  Neck: Supple. Thyroid not palpable. Car 2+/2+ without bruits, nodes or JVD. Chest: Respirations nl with BS clear & equal w/o rales, rhonchi, wheezing or stridor.  Cor: Heart sounds normal w/ regular rate and rhythm without sig. murmurs, gallops, clicks or rubs. Peripheral pulses normal and equal  without edema.  Abdomen: Soft & bowel sounds normal. Non-tender w/o guarding, rebound, hernias, masses or organomegaly.  Lymphatics: Unremarkable.  Musculoskeletal: Full ROM all peripheral extremities, joint stability,  5/5 strength and normal gait.  Skin: Warm, dry without exposed rashes, lesions or ecchymosis apparent.  Neuro: Cranial nerves intact, reflexes equal bilaterally. Sensory-motor testing grossly intact. Tendon reflexes grossly intact.  Pysch: Alert & oriented x 3.  Insight and judgement nl & appropriate. No ideations.  Assessment and Plan:  1. Labile hypertension  - Continue medication, monitor blood pressure at home.  - Continue DASH diet.  Reminder to go to the ER if any CP,  SOB, nausea, dizziness, severe HA, changes vision/speech.  - CBC with Differential/Platelet - COMPLETE METABOLIC PANEL WITH GFR - Magnesium - TSH  2. Hyperlipidemia, mixed  - Continue diet/meds, exercise,& lifestyle modifications.  - Continue monitor periodic cholesterol/liver & renal functions   - Lipid panel - TSH  3. Abnormal glucose  - Continue diet, exercise  - Lifestyle modifications.  - Monitor appropriate labs.  - Hemoglobin A1c - Insulin, random  4. Vitamin D deficiency  - Continue supplementation.  - VITAMIN D 25 Hydroxy  5. Hypothyroidism  - TSH  6. Medication management  - CBC with Differential/Platelet - COMPLETE METABOLIC PANEL WITH GFR - Magnesium - Lipid panel -  TSH - Hemoglobin A1c  - Insulin, random - VITAMIN D 25 Hydroxy         Discussed  regular exercise, BP monitoring, weight control to achieve/maintain BMI less than 25 and discussed med and SE's. Recommended labs to assess and monitor clinical status with further disposition pending results of labs.  I discussed the assessment and treatment plan with the patient. The patient was provided an opportunity to ask questions and all were answered. The patient agreed with the plan and demonstrated an understanding of the instructions.  I provided over 30 minutes of exam, counseling, chart review and  complex critical decision making.  Kirtland Bouchard, MD

## 2019-12-04 NOTE — Patient Instructions (Signed)
- Link to sign up at [place] Web site for the Covid[place]19 vaccine   [email or web address]   Or call [phone]  ++++++++++++++++++++++++++++++++++  Vit D  & Vit C 1,000 mg   are recommended to help protect  against the Covid-19 and other Corona viruses.    Also it's recommended  to take  Zinc 50 mg  to help  protect against the Covid-19   and best place to get  is also on [employer].com  and don't pay more than 6-8 cents /pill !   ===================================== Coronavirus (COVID-19) Are you at risk?  Are you at risk for the Coronavirus (COVID-19)?  To be considered HIGH RISK for Coronavirus (COVID-19), you have to meet the following criteria:  . Traveled to [place], [place], [place], [place] or [place]; or in the [place] to [place], [place], [place]  . or [place]; and have fever, cough, and shortness of breath within the last 2 weeks of travel OR . Been in close contact with a person diagnosed with COVID-19 within the last 2 weeks and have  . fever, cough,and shortness of breath .  . IF YOU DO NOT MEET THESE CRITERIA, YOU ARE CONSIDERED LOW RISK FOR COVID-19.  What to do if you are HIGH RISK for COVID-19?  . If you are having a medical emergency, call 911. . Seek medical care right away. Before you go to a doctor's office, urgent care or emergency department, .  call ahead and tell them about your recent travel, contact with someone diagnosed with COVID-19  .  and your symptoms.  . You should receive instructions from your physician's office regarding next steps of care.  . When you arrive at healthcare provider, tell the healthcare staff immediately you have returned from  . visiting [place], [place], [place], [place] or [place]; or traveled in the [place] to [place], [place],  . [place] or [place] in the last  two weeks or you have been in close contact with a person diagnosed with  . COVID-19 in the last 2 weeks.   . Tell the health care staff about your symptoms: fever, cough and shortness of breath. . After you have been seen by a medical provider, you will be either: o Tested for (COVID-19) and discharged home on quarantine except to seek medical care if  o symptoms worsen, and asked to  - Stay home and avoid contact with others until you get your results (4-5 days)  - Avoid travel on public transportation if possible (such as bus, train, or airplane) or o Sent to the Emergency Department by EMS for evaluation, COVID-19 testing  and  o possible admission depending on your condition and test results.  What to do if you are LOW RISK for COVID-19?  Reduce your risk of any infection by using the same precautions used for avoiding the common cold or flu:  . Wash your hands often with soap and warm water for at least 20 seconds.  If soap and water are not readily available,  . use an alcohol-based hand sanitizer with at least 60% alcohol.  . If coughing or sneezing, cover your mouth and nose by coughing or sneezing into the elbow areas of your shirt or coat, .  into a tissue or into your sleeve (not your hands). . Avoid shaking hands with others and consider head nods or verbal greetings only. . Avoid touching your eyes, nose, or mouth with unwashed   hands.  . Avoid close contact with people who are sick. . Avoid places or events with large numbers of people in one location, like concerts or sporting events. . Carefully consider travel plans you have or are making. . If you are planning any travel outside or inside the [place], visit the CDC's Travelers' Health webpage for the latest health notices. . If you have some symptoms but not all symptoms, continue to monitor at home and seek medical attention  . if your symptoms worsen. . If you are having a medical emergency, call  911.   ++++++++++++++++++++++++++++++++ Recommend Adult Low Dose Aspirin or  coated  Aspirin 81 mg daily  To reduce risk of Colon Cancer 40 %,  Skin Cancer 26 % ,  Melanoma 46%  and  Pancreatic cancer 60% ++++++++++++++++++++++++++++++++ Vitamin D goal  is between 70-100.  Please make sure that you are taking your Vitamin D as directed.  It is very important as a natural anti-inflammatory  helping hair, skin, and nails, as well as reducing stroke and heart attack risk.  It helps your bones and helps with mood. It also decreases numerous cancer risks so please take it as directed.  Low Vit D is associated with a 200-300% higher risk for CANCER  and 200-300% higher risk for HEART   ATTACK  &  STROKE.   ...................................... It is also associated with higher death rate at younger ages,  autoimmune diseases like Rheumatoid arthritis, Lupus, Multiple Sclerosis.    Also many other serious conditions, like depression, Alzheimer's Dementia, infertility, muscle aches, fatigue, fibromyalgia - just to name a few. ++++++++++++++++++++ Recommend the book "The END of DIETING" by Dr Joel Fuhrman  & the book "The END of DIABETES " by Dr Joel Fuhrman At [employer].com - get book & Audio CD's    Being diabetic has a  300% increased risk for heart attack, stroke, cancer, and alzheimer- type vascular dementia. It is very important that you work harder with diet by avoiding all foods that are white. Avoid white rice (brown & wild rice is OK), white potatoes (sweetpotatoes in moderation is OK), White bread or wheat bread or anything made out of white flour like bagels, donuts, rolls, buns, biscuits, cakes, pastries, cookies, pizza crust, and pasta (made from white flour & egg whites) - vegetarian pasta or spinach or wheat pasta is OK. Multigrain breads like Arnold's or Pepperidge Farm, or multigrain sandwich thins or flatbreads.  Diet, exercise and weight loss can reverse and cure diabetes in  the early stages.  Diet, exercise and weight loss is very important in the control and prevention of complications of diabetes which affects every system in your body, ie. Brain - dementia/stroke, eyes - glaucoma/blindness, heart - heart attack/heart failure, kidneys - dialysis, stomach - gastric paralysis, intestines - malabsorption, nerves - severe painful neuritis, circulation - gangrene & loss of a leg(s), and finally cancer and Alzheimers.    I recommend avoid fried & greasy foods,  sweets/candy, white rice (brown or wild rice or Quinoa is OK), white potatoes (sweet potatoes are OK) - anything made from white flour - bagels, doughnuts, rolls, buns, biscuits,white and wheat breads, pizza crust and traditional pasta made of white flour & egg white(vegetarian pasta or spinach or wheat pasta is OK).  Multi-grain bread is OK - like multi-grain flat bread or sandwich thins. Avoid alcohol in excess. Exercise is also important.    Eat all the vegetables you want - avoid meat, especially red meat and   dairy - especially cheese.  Cheese is the most concentrated form of trans-fats which is the worst thing to clog up our arteries. Veggie cheese is OK which can be found in the fresh produce section at [employer] or [employer] or [employer]  +++++++++++++++++++++ DASH Eating Plan  DASH stands for "Dietary Approaches to Stop Hypertension."   The DASH eating plan is a healthy eating plan that has been shown to reduce high blood pressure (hypertension). Additional health benefits may include reducing the risk of type 2 diabetes mellitus, heart disease, and stroke. The DASH eating plan may also help with weight loss. WHAT DO I NEED TO KNOW ABOUT THE DASH EATING PLAN? For the DASH eating plan, you will follow these general guidelines:  Choose foods with a percent daily value for sodium of less than 5% (as listed on the food label).  Use salt-free seasonings or herbs instead of table salt or sea salt.  Check  with your health care provider or pharmacist before using salt substitutes.  Eat lower-sodium products, often labeled as "lower sodium" or "no salt added."  Eat fresh foods.  Eat more vegetables, fruits, and low-fat dairy products.  Choose whole grains. Look for the word "whole" as the first word in the ingredient list.  Choose fish   Limit sweets, desserts, sugars, and sugary drinks.  Choose heart-healthy fats.  Eat veggie cheese   Eat more home-cooked food and less restaurant, buffet, and fast food.  Limit fried foods.  Cook foods using methods other than frying.  Limit canned vegetables. If you do use them, rinse them well to decrease the sodium.  When eating at a restaurant, ask that your food be prepared with less salt, or no salt if possible.                      WHAT FOODS CAN I EAT? Read Dr Joel Fuhrman's books on The End of Dieting & The End of Diabetes  Grains Whole grain or whole wheat bread. Brown rice. Whole grain or whole wheat pasta. Quinoa, bulgur, and whole grain cereals. Low-sodium cereals. Corn or whole wheat flour tortillas. Whole grain cornbread. Whole grain crackers. Low-sodium crackers.  Vegetables Fresh or frozen vegetables (raw, steamed, roasted, or grilled). Low-sodium or reduced-sodium tomato and vegetable juices. Low-sodium or reduced-sodium tomato sauce and paste. Low-sodium or reduced-sodium canned vegetables.   Fruits All fresh, canned (in natural juice), or frozen fruits.  Protein Products  All fish and seafood.  Dried beans, peas, or lentils. Unsalted nuts and seeds. Unsalted canned beans.  Dairy Low-fat dairy products, such as skim or 1% milk, 2% or reduced-fat cheeses, low-fat ricotta or cottage cheese, or plain low-fat yogurt. Low-sodium or reduced-sodium cheeses.  Fats and Oils Tub margarines without trans fats. Light or reduced-fat mayonnaise and salad dressings (reduced sodium). Avocado. Safflower, olive, or canola oils. Natural  peanut or almond butter.  Other Unsalted popcorn and pretzels. The items listed above may not be a complete list of recommended foods or beverages. Contact your dietitian for more options.  +++++++++++++++  WHAT FOODS ARE NOT RECOMMENDED? Grains/ White flour or wheat flour White bread. White pasta. White rice. Refined cornbread. Bagels and croissants. Crackers that contain trans fat.  Vegetables  Creamed or fried vegetables. Vegetables in a . Regular canned vegetables. Regular canned tomato sauce and paste. Regular tomato and vegetable juices.  Fruits Dried fruits. Canned fruit in light or heavy syrup. Fruit juice.  Meat and Other Protein Products Meat   in general - RED meat & White meat.  Fatty cuts of meat. Ribs, chicken wings, all processed meats as bacon, sausage, bologna, salami, fatback, hot dogs, bratwurst and packaged luncheon meats.  Dairy Whole or 2% milk, cream, half-and-half, and cream cheese. Whole-fat or sweetened yogurt. Full-fat cheeses or blue cheese. Non-dairy creamers and whipped toppings. Processed cheese, cheese spreads, or cheese curds.  Condiments Onion and garlic salt, seasoned salt, table salt, and sea salt. Canned and packaged gravies. Worcestershire sauce. Tartar sauce. Barbecue sauce. Teriyaki sauce. Soy sauce, including reduced sodium. Steak sauce. Fish sauce. Oyster sauce. Cocktail sauce. Horseradish. Ketchup and mustard. Meat flavorings and tenderizers. Bouillon cubes. Hot sauce. Tabasco sauce. Marinades. Taco seasonings. Relishes.  Fats and Oils Butter, stick margarine, lard, shortening and bacon fat. Coconut, palm kernel, or palm oils. Regular salad dressings.  Pickles and olives. Salted popcorn and pretzels.  The items listed above may not be a complete list of foods and beverages to avoid.   

## 2019-12-05 ENCOUNTER — Other Ambulatory Visit: Payer: Self-pay

## 2019-12-05 ENCOUNTER — Ambulatory Visit: Payer: PPO | Admitting: Adult Health

## 2019-12-05 ENCOUNTER — Ambulatory Visit (INDEPENDENT_AMBULATORY_CARE_PROVIDER_SITE_OTHER): Payer: PPO | Admitting: Internal Medicine

## 2019-12-05 ENCOUNTER — Ambulatory Visit: Payer: PPO | Admitting: Adult Health Nurse Practitioner

## 2019-12-05 VITALS — BP 124/78 | HR 64 | Temp 97.0°F | Resp 16 | Ht 63.0 in | Wt 144.8 lb

## 2019-12-05 DIAGNOSIS — R0989 Other specified symptoms and signs involving the circulatory and respiratory systems: Secondary | ICD-10-CM | POA: Diagnosis not present

## 2019-12-05 DIAGNOSIS — E782 Mixed hyperlipidemia: Secondary | ICD-10-CM

## 2019-12-05 DIAGNOSIS — Z79899 Other long term (current) drug therapy: Secondary | ICD-10-CM | POA: Diagnosis not present

## 2019-12-05 DIAGNOSIS — E559 Vitamin D deficiency, unspecified: Secondary | ICD-10-CM

## 2019-12-05 DIAGNOSIS — E039 Hypothyroidism, unspecified: Secondary | ICD-10-CM

## 2019-12-05 DIAGNOSIS — R7309 Other abnormal glucose: Secondary | ICD-10-CM | POA: Diagnosis not present

## 2019-12-06 LAB — COMPLETE METABOLIC PANEL WITH GFR
AG Ratio: 2.2 (calc) (ref 1.0–2.5)
ALT: 18 U/L (ref 6–29)
AST: 21 U/L (ref 10–35)
Albumin: 4.7 g/dL (ref 3.6–5.1)
Alkaline phosphatase (APISO): 28 U/L — ABNORMAL LOW (ref 37–153)
BUN: 20 mg/dL (ref 7–25)
CO2: 27 mmol/L (ref 20–32)
Calcium: 9.8 mg/dL (ref 8.6–10.4)
Chloride: 107 mmol/L (ref 98–110)
Creat: 0.82 mg/dL (ref 0.50–0.99)
GFR, Est African American: 85 mL/min/{1.73_m2} (ref >=60)
GFR, Est Non African American: 73 mL/min/{1.73_m2} (ref >=60)
Globulin: 2.1 g/dL (calc) (ref 1.9–3.7)
Glucose, Bld: 98 mg/dL (ref 65–99)
Potassium: 4.2 mmol/L (ref 3.5–5.3)
Sodium: 142 mmol/L (ref 135–146)
Total Bilirubin: 0.4 mg/dL (ref 0.2–1.2)
Total Protein: 6.8 g/dL (ref 6.1–8.1)

## 2019-12-06 LAB — LIPID PANEL
Cholesterol: 130 mg/dL (ref <200)
HDL: 64 mg/dL (ref >=50)
LDL Cholesterol (Calc): 50 mg/dL (calc)
Non-HDL Cholesterol (Calc): 66 mg/dL (calc) (ref <130)
Total CHOL/HDL Ratio: 2 (calc) (ref <5.0)
Triglycerides: 76 mg/dL (ref <150)

## 2019-12-06 LAB — CBC WITH DIFFERENTIAL/PLATELET
Absolute Monocytes: 239 cells/uL (ref 200–950)
Basophils Absolute: 42 cells/uL (ref 0–200)
Basophils Relative: 1 %
Eosinophils Absolute: 50 cells/uL (ref 15–500)
Eosinophils Relative: 1.2 %
HCT: 38.5 % (ref 35.0–45.0)
Hemoglobin: 13.2 g/dL (ref 11.7–15.5)
Lymphs Abs: 1919 cells/uL (ref 850–3900)
MCH: 31.1 pg (ref 27.0–33.0)
MCHC: 34.3 g/dL (ref 32.0–36.0)
MCV: 90.6 fL (ref 80.0–100.0)
MPV: 9.5 fL (ref 7.5–12.5)
Monocytes Relative: 5.7 %
Neutro Abs: 1949 cells/uL (ref 1500–7800)
Neutrophils Relative %: 46.4 %
Platelets: 297 10*3/uL (ref 140–400)
RBC: 4.25 10*6/uL (ref 3.80–5.10)
RDW: 12.5 % (ref 11.0–15.0)
Total Lymphocyte: 45.7 %
WBC: 4.2 10*3/uL (ref 3.8–10.8)

## 2019-12-06 LAB — TSH: TSH: 1.74 mIU/L (ref 0.40–4.50)

## 2019-12-06 LAB — VITAMIN D 25 HYDROXY (VIT D DEFICIENCY, FRACTURES): Vit D, 25-Hydroxy: 55 ng/mL (ref 30–100)

## 2019-12-06 LAB — MAGNESIUM: Magnesium: 1.9 mg/dL (ref 1.5–2.5)

## 2019-12-06 LAB — HEMOGLOBIN A1C
Hgb A1c MFr Bld: 5.4 % of total Hgb (ref <5.7)
Mean Plasma Glucose: 108 (calc)
eAG (mmol/L): 6 (calc)

## 2019-12-06 LAB — INSULIN, RANDOM: Insulin: 4.8 u[IU]/mL

## 2019-12-07 NOTE — Progress Notes (Signed)
12/07/2019--patient is aware of lab results. - ewelch

## 2020-01-02 ENCOUNTER — Ambulatory Visit: Payer: Self-pay | Admitting: Adult Health

## 2020-03-04 DIAGNOSIS — Z779 Other contact with and (suspected) exposures hazardous to health: Secondary | ICD-10-CM | POA: Diagnosis not present

## 2020-03-04 DIAGNOSIS — Z124 Encounter for screening for malignant neoplasm of cervix: Secondary | ICD-10-CM | POA: Diagnosis not present

## 2020-03-04 DIAGNOSIS — Z6825 Body mass index (BMI) 25.0-25.9, adult: Secondary | ICD-10-CM | POA: Diagnosis not present

## 2020-03-07 ENCOUNTER — Ambulatory Visit: Payer: PPO | Admitting: Internal Medicine

## 2020-03-18 NOTE — Progress Notes (Signed)
MEDICARE ANNUAL WELLNESS VISIT AND FOLLOW UP  Assessment:   Diagnoses and all orders for this visit:  Encounter for Medicare annual wellness exam  Gastroesophageal reflux disease, esophagitis presence not specified Well managed on current medications; PPI use PRN only Discussed diet, avoiding triggers and other lifestyle changes -     Magnesium  Irritable bowel syndrome, unspecified type Avoid trigger patient reports asymptomatic this year  Hypothyroidism, unspecified type continue medications the same reminded to take on an empty stomach 30-37mins before food.  -     TSH  Macular degeneration of left eye, unspecified type Managed by current employer at [facility]  Medication management -     CBC with Differential/Platelet -     CMP/GFR -     Magnesium   Mixed hyperlipidemia Newly on rosuvastatin 20 mg every other day  Continue to recommend low fat/cholesterol diet, exercise, weight management -     Lipid panel  RLS (restless legs syndrome) Not currently on any prescriptions medications, doing well reportedly using an OTC topical cream that seems to help  Vitamin D deficiency Near goal at last visit; continue supplement to maintain between 60-100  Other abnormal glucose Continue to recommend diet low in processed carbohydrates, exercise, weight management Check A1C annually -     CMP/GFRWITH GFR  Former smoker (28 pack year history, quit 1982) Discussed, will obtain follow up CXR Consider PRN inhaler to start with bronchitis sx during winter  Labile hypertension Initially elevated, improved with recheck Discussed BP goal of <130/80 She will plan to restart checking more regularly at home, call if persistently above goal  Per strong patient preference will defer on med addition at this time    Over 40 minutes of exam, counseling, chart review and critical decision making was performed Future Appointments  Date Time Provider [place]  09/16/2020   9:00 AM Unk Pinto, MD [facility] None     Plan:   During the course of the visit the patient was educated and counseled about appropriate screening and preventive services including:    Pneumococcal vaccine   Prevnar 13  Influenza vaccine  Td vaccine  Screening electrocardiogram  Bone densitometry screening  Colorectal cancer screening  Diabetes screening  Glaucoma screening  Nutrition counseling   Advanced directives: requested   Subjective:  Sarah Hartman is a 69 y.o. female who presents for Medicare Annual Wellness Visit and 3 month follow up.   Has been very stressed, just sold her house, made offer on new home, timing overlapped with upcoming trip to [facility]. Also broke up with her long time boyfriend, has been stressed since February.   She is followed by Dr. 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 replentix She reports symptoms improved recently.   She has dx of RLS, reports has OTC topical that continues to work well for her. None in recent months.   BMI is Body mass index is 25.4 kg/m., she has been working on diet and exercise. She run/walks 60 min most days. 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 Wt Readings from Last 3 Encounters:  03/19/20 143 lb 6.4 oz (65 kg)  12/05/19 144 lb 12.8 oz (65.7 kg)  08/31/19 145 lb 6.4 oz (66 kg)   she has a diagnosis of GERD which is currently managed by pantoprazole 40 mg PRN only, typically takes a few days monthly.  she reports symptoms is  currently well controlled, and denies breakthrough reflux, burning in chest, hoarseness or cough.    She is a former smoker, 28 pack year quit in 1982. Denies symptoms other than annual bronchitis ~2 weeks around Christmas. Didn't have last year during the pandemic, wonders if mask vs social distancing helped.   She does have BP cuff at home, admits  hasn't been checking recently as was normalizing a few months ago. Today their BP is BP: 136/84 She does workout. She denies chest pain, shortness of breath, dizziness.   She is on cholesterol medication (newly on rosuvastatin 20 mg every other day) and denies myalgias. Her cholesterol is not at goal. The cholesterol last visit was:   Lab Results  Component Value Date   CHOL 130 12/05/2019   HDL 64 12/05/2019   LDLCALC 50 12/05/2019   TRIG 76 12/05/2019   CHOLHDL 2.0 12/05/2019    Last A1C in the office was:  Lab Results  Component Value Date   HGBA1C 5.4 12/05/2019   Last GFR: Lab Results  Component Value Date   GFRNONAA 73 12/05/2019   Patient is on Vitamin D supplement and near goal at last check  Lab Results  Component Value Date   VD25OH 15 12/05/2019     She is on thyroid medication. Her medication was not changed last visit. Takes 50 mcg daily.   Lab Results  Component Value Date   TSH 1.74 12/05/2019  .   Medication Review: Current Outpatient Medications on File Prior to Visit  Medication Sig Dispense Refill  . beta carotene w/minerals (OCUVITE) tablet Take 1 tablet by mouth daily.    . Cholecalciferol (VITAMIN D3) 5000 units TABS Take 5,000 Units by mouth daily.    Marland Kitchen levothyroxine (SYNTHROID) 50 MCG tablet Take 1 tablet daily on an empty stomach with only water for 30 minutes & no Antacid meds, Calcium or Magnesium for 4 hours & avoid Biotin 90 tablet 3  . pantoprazole (PROTONIX) 40 MG tablet TAKE 1 TABLET BY MOUTH ONCE DAILY AS NEEDED FOR  HEARTBURN  AND  REFLUX 90 tablet 0  . rosuvastatin (CRESTOR) 40 MG tablet Take 1/2 to 1 tablet daily or as directed for Cholesterol (Patient taking differently: Takes 1/2 tablet every other day) 90 tablet 1   No current facility-administered medications on file prior to visit.    Allergies  Allergen Reactions  . Codeine Nausea And Vomiting and Other (See Comments)    Migraine    Current Problems (verified) Patient  Active Problem List   Diagnosis Date Noted  . Former smoker (28 pack year history, quit 1982) 03/19/2020  . Osteopenia 12/28/2017  . Encounter for Medicare annual wellness exam 12/23/2015  . Mixed hyperlipidemia 02/07/2015  . Other abnormal glucose 02/07/2015  . Hypothyroidism 02/07/2015  . Medication management 02/07/2015  . Vitamin D deficiency 11/15/2013  . GERD (gastroesophageal reflux disease)   . Macular degeneration of left eye   . IBS (irritable bowel syndrome)   . RLS (restless legs syndrome)   . Labile hypertension     Screening Tests Immunization History  Administered Date(s) Administered  . Influenza, High Dose Seasonal PF 08/01/2018  . Influenza-Unspecified 07/29/2016  . Moderna SARS-COVID-2 Vaccination 11/29/2019, 12/27/2019  . Pneumococcal Conjugate-13 08/19/2015  . Pneumococcal Polysaccharide-23 11/09/2012, 08/01/2018  . Td 12/27/2018  . Tdap 10/18/2008    Preventative care: Last colonoscopy: 2018 q 5 year Last mammogram: 03/2019 has scheduled Last pap smear/pelvic exam: PAP last week 03/2020, unsatisfactory results, will repeat 6  months with Dr. Nori Riis  DEXA: 04/2016 -  by Dr. Nori Riis  - osteopenia - will ask GYN about follow up   TD or Tdap: 12/2018 Influenza: 2020  Pneumococcal: 2014, 2019 Prevnar13: 2016 Shingles/Zostavax: reports had at pharmacy  Covid 19: 2/2, 2021, moderna   Names of Other Physician/Practitioners you currently use: 1. [facility] here for primary care 2. [place], eye doctor, last visit 2019 3. [facility] [place], Pharmacist, community, last visit 2020  Patient Care Team: Unk Pinto, MD as PCP - General (Internal Medicine) Ninetta Lights, MD (Inactive) as Consulting Physician (Orthopedic Surgery) Irene Shipper, MD as Consulting Physician (Gastroenterology) Festus Aloe, MD as Consulting Physician (Urology) Maisie Fus, MD as Consulting Physician (Obstetrics and Gynecology)  SURGICAL  HISTORY She  has a past surgical history that includes Colonoscopy; Tubal ligation; Tendon reconstruction (Right, 04/27/2013); and Polypectomy. FAMILY HISTORY Her family history includes Alzheimer's disease in her mother; Cerebral aneurysm in her father; Hypertension in her father; Stomach cancer (age of onset: 47) in her paternal grandmother. SOCIAL HISTORY She  reports that she quit smoking about 38 years ago. She started smoking about 53 years ago. She has a 28.00 pack-year smoking history. She has never used smokeless tobacco. She reports current alcohol use of about 3.0 standard drinks of alcohol per week. She reports that she does not use drugs.   MEDICARE WELLNESS OBJECTIVES: Physical activity: Current Exercise Habits: Home exercise routine, Type of exercise: walking;treadmill, Time (Minutes): 60, Intensity: Moderate, Exercise limited by: None identified Cardiac risk factors: Cardiac Risk Factors include: advanced age (>31men, >66 women);hypertension;dyslipidemia Depression/mood screen:   Depression screen [facility] 2/9 03/19/2020  Decreased Interest 0  Down, Depressed, Hopeless 1  PHQ - 2 Score 1    ADLs:  In your present state of health, do you have any difficulty performing the following activities: 03/19/2020 12/04/2019  Hearing? N N  Vision? N N  Difficulty concentrating or making decisions? N N  Walking or climbing stairs? N N  Dressing or bathing? N N  Doing errands, shopping? N N  Some recent data might be hidden     Cognitive Testing  Alert? Yes  Normal Appearance?Yes  Oriented to person? Yes  Place? Yes   Time? Yes  Recall of three objects?  Yes  Can perform simple calculations? Yes  Displays appropriate judgment?Yes  Can read the correct time from a watch face?Yes  EOL planning: Does Patient Have a Medical Advance Directive?: No Would patient like information on creating a medical advance directive?: No - Patient declined  Review of Systems  Constitutional: Negative  for malaise/fatigue and weight loss.  HENT: Negative for hearing loss and tinnitus.   Eyes: Negative for blurred vision and double vision.  Respiratory: Negative for cough, shortness of breath and wheezing.   Cardiovascular: Negative for chest pain, palpitations, orthopnea, claudication and leg swelling.  Gastrointestinal: Negative for abdominal pain, blood in stool, constipation, diarrhea, heartburn, melena, nausea and vomiting.  Genitourinary: Negative for dysuria, flank pain, frequency, hematuria and urgency.  Musculoskeletal: Negative for falls, joint pain and myalgias.  Skin: Negative for rash.  Neurological: Negative for dizziness, tingling, sensory change, weakness and headaches.  Endo/Heme/Allergies: Negative for polydipsia.  Psychiatric/Behavioral: Negative.  Negative for depression, memory loss and substance abuse. The patient is not nervous/anxious and does not have insomnia.   All other systems reviewed and are negative.    Objective:     Today's Vitals   03/19/20 1005 03/19/20 1036  BP: (!) 144/90 136/84  Pulse: (!) 51   Temp: (!) 97.5 F (36.4 C)   SpO2: 98%   Weight: 143 lb 6.4 oz (65 kg)   Height: 5\' 3"  (1.6 m)    Body mass index is 25.4 kg/m.  General appearance: alert, no distress, WD/WN, female HEENT: normocephalic, sclerae anicteric, TMs pearly, nares patent, no discharge or erythema, pharynx normal Oral cavity: MMM, no lesions Neck: supple, no lymphadenopathy, no thyromegaly, no masses Heart: RRR, normal S1, S2, no murmurs Lungs: CTA bilaterally, no wheezes, rhonchi, or rales Abdomen: +bs, soft, non tender, non distended, no masses, no hepatomegaly, no splenomegaly Musculoskeletal: nontender, no swelling, no obvious deformity Extremities: no edema, no cyanosis, no clubbing Pulses: 2+ symmetric, upper and lower extremities, normal cap refill Neurological: alert, oriented x 3, CN2-12 intact, strength normal upper extremities and lower extremities, sensation  normal throughout, DTRs 2+ throughout, no cerebellar signs, gait normal Psychiatric: normal affect, behavior normal, pleasant   Medicare Attestation I have personally reviewed: The patient's medical and social history Their use of alcohol, tobacco or illicit drugs Their current medications and supplements The patient's functional ability including ADLs,fall risks, home safety risks, cognitive, and hearing and visual impairment Diet and physical activities Evidence for depression or mood disorders  The patient's weight, height, BMI, and visual acuity have been recorded in the chart.  I have made referrals, counseling, and provided education to the patient based on review of the above and I have provided the patient with a written personalized care plan for preventive services.     Izora Ribas, NP   03/19/2020

## 2020-03-19 ENCOUNTER — Encounter: Payer: Self-pay | Admitting: Adult Health

## 2020-03-19 ENCOUNTER — Ambulatory Visit (INDEPENDENT_AMBULATORY_CARE_PROVIDER_SITE_OTHER): Payer: PPO | Admitting: Adult Health

## 2020-03-19 ENCOUNTER — Other Ambulatory Visit: Payer: Self-pay

## 2020-03-19 VITALS — BP 136/84 | HR 51 | Temp 97.5°F | Ht 63.0 in | Wt 143.4 lb

## 2020-03-19 DIAGNOSIS — K589 Irritable bowel syndrome without diarrhea: Secondary | ICD-10-CM

## 2020-03-19 DIAGNOSIS — Z0001 Encounter for general adult medical examination with abnormal findings: Secondary | ICD-10-CM | POA: Diagnosis not present

## 2020-03-19 DIAGNOSIS — R7309 Other abnormal glucose: Secondary | ICD-10-CM | POA: Diagnosis not present

## 2020-03-19 DIAGNOSIS — Z0000 Encounter for general adult medical examination without abnormal findings: Secondary | ICD-10-CM

## 2020-03-19 DIAGNOSIS — M8589 Other specified disorders of bone density and structure, multiple sites: Secondary | ICD-10-CM | POA: Diagnosis not present

## 2020-03-19 DIAGNOSIS — R0989 Other specified symptoms and signs involving the circulatory and respiratory systems: Secondary | ICD-10-CM | POA: Diagnosis not present

## 2020-03-19 DIAGNOSIS — E559 Vitamin D deficiency, unspecified: Secondary | ICD-10-CM

## 2020-03-19 DIAGNOSIS — E782 Mixed hyperlipidemia: Secondary | ICD-10-CM

## 2020-03-19 DIAGNOSIS — H3530 Unspecified macular degeneration: Secondary | ICD-10-CM | POA: Diagnosis not present

## 2020-03-19 DIAGNOSIS — Z87891 Personal history of nicotine dependence: Secondary | ICD-10-CM | POA: Diagnosis not present

## 2020-03-19 DIAGNOSIS — K219 Gastro-esophageal reflux disease without esophagitis: Secondary | ICD-10-CM | POA: Diagnosis not present

## 2020-03-19 DIAGNOSIS — E039 Hypothyroidism, unspecified: Secondary | ICD-10-CM | POA: Diagnosis not present

## 2020-03-19 DIAGNOSIS — G2581 Restless legs syndrome: Secondary | ICD-10-CM

## 2020-03-19 DIAGNOSIS — R6889 Other general symptoms and signs: Secondary | ICD-10-CM

## 2020-03-19 DIAGNOSIS — Z79899 Other long term (current) drug therapy: Secondary | ICD-10-CM

## 2020-03-19 NOTE — Patient Instructions (Addendum)
Sarah Hartman , Thank you for taking time to come for your Medicare Wellness Visit. I appreciate your ongoing commitment to your health goals. Please review the following plan we discussed and let me know if I can assist you in the future.   These are the goals we discussed: Goals    . Blood Pressure < 130/80    . LDL CALC < 100       This is a list of the screening recommended for you and due dates:  Health Maintenance  Topic Date Due  . Mammogram  03/21/2020  . Flu Shot  06/02/2020  . Colon Cancer Screening  09/21/2022  . Tetanus Vaccine  12/27/2028  . DEXA scan (bone density measurement)  Completed  . COVID-19 Vaccine  Completed  .  Hepatitis C: One time screening is recommended by Center for Disease Control  (CDC) for  adults born from [birth year] through 1966.   Completed  . Pneumonia vaccines  Completed     Please ask Dr. Nori Riis about osteopenia (bone density) follow up   Call in a few weeks to let me know how blood pressures are doing  Please get chest xray at [facility] - can walk in 8 am - 4:30 pm  Consider holding a inhaler sample, start as soon as you develop cough, bronchitis symptoms in the winter   HYPERTENSION INFORMATION  Monitor your blood pressure at home, please keep a record and bring that in with you to your next office visit.   Go to the ER if any CP, SOB, nausea, dizziness, severe HA, changes vision/speech  Testing/Procedures: HOW TO TAKE YOUR BLOOD PRESSURE:  Rest 5 minutes before taking your blood pressure.  Don't smoke or drink caffeinated beverages for at least 30 minutes before.  Take your blood pressure before (not after) you eat.  Sit comfortably with your back supported and both feet on the floor (don't cross your legs).  Elevate your arm to heart level on a table or a desk.  Use the proper sized cuff. It should fit smoothly and snugly around your bare upper arm. There should be enough room to slip a fingertip under the cuff.  The bottom edge of the cuff should be 1 inch above the crease of the elbow.   Your most recent BP: BP: 136/84   Take your medications faithfully as instructed. Maintain a healthy weight. Get at least 150 minutes of aerobic exercise per week. Minimize salt intake. Minimize alcohol intake  DASH Eating Plan DASH stands for "Dietary Approaches to Stop Hypertension." The DASH eating plan is a healthy eating plan that has been shown to reduce high blood pressure (hypertension). Additional health benefits may include reducing the risk of type 2 diabetes mellitus, heart disease, and stroke. The DASH eating plan may also help with weight loss. WHAT DO I NEED TO KNOW ABOUT THE DASH EATING PLAN? For the DASH eating plan, you will follow these general guidelines:  Choose foods with a percent daily value for sodium of less than 5% (as listed on the food label).  Use salt-free seasonings or herbs instead of table salt or sea salt.  Check with your health care provider or pharmacist before using salt substitutes.  Eat lower-sodium products, often labeled as "lower sodium" or "no salt added."  Eat fresh foods.  Eat more vegetables, fruits, and low-fat dairy products.  Choose whole grains. Look for the word "whole" as the first word in the ingredient list.  Choose  fish and skinless chicken or [place] more often than red meat. Limit fish, poultry, and meat to 6 oz (170 g) each day.  Limit sweets, desserts, sugars, and sugary drinks.  Choose heart-healthy fats.  Limit cheese to 1 oz (28 g) per day.  Eat more home-cooked food and less restaurant, buffet, and fast food.  Limit fried foods.  Cook foods using methods other than frying.  Limit canned vegetables. If you do use them, rinse them well to decrease the sodium.  When eating at a restaurant, ask that your food be prepared with less salt, or no salt if possible. WHAT FOODS CAN I EAT? Seek help from a dietitian for individual calorie  needs. Grains Whole grain or whole wheat bread. Brown rice. Whole grain or whole wheat pasta. Quinoa, bulgur, and whole grain cereals. Low-sodium cereals. Corn or whole wheat flour tortillas. Whole grain cornbread. Whole grain crackers. Low-sodium crackers. Vegetables Fresh or frozen vegetables (raw, steamed, roasted, or grilled). Low-sodium or reduced-sodium tomato and vegetable juices. Low-sodium or reduced-sodium tomato sauce and paste. Low-sodium or reduced-sodium canned vegetables.  Fruits All fresh, canned (in natural juice), or frozen fruits. Meat and Other Protein Products Ground beef (85% or leaner), grass-fed beef, or beef trimmed of fat. Skinless chicken or [place]. Ground chicken or [place]. Pork trimmed of fat. All fish and seafood. Eggs. Dried beans, peas, or lentils. Unsalted nuts and seeds. Unsalted canned beans. Dairy Low-fat dairy products, such as skim or 1% milk, 2% or reduced-fat cheeses, low-fat ricotta or cottage cheese, or plain low-fat yogurt. Low-sodium or reduced-sodium cheeses. Fats and Oils Tub margarines without trans fats. Light or reduced-fat mayonnaise and salad dressings (reduced sodium). Avocado. Safflower, olive, or canola oils. Natural peanut or almond butter. Other Unsalted popcorn and pretzels. The items listed above may not be a complete list of recommended foods or beverages. Contact your dietitian for more options. WHAT FOODS ARE NOT RECOMMENDED? Grains White bread. White pasta. White rice. Refined cornbread. Bagels and croissants. Crackers that contain trans fat. Vegetables Creamed or fried vegetables. Vegetables in a cheese sauce. Regular canned vegetables. Regular canned tomato sauce and paste. Regular tomato and vegetable juices. Fruits Dried fruits. Canned fruit in light or heavy syrup. Fruit juice. Meat and Other Protein Products Fatty cuts of meat. Ribs, chicken wings, bacon, sausage, bologna, salami, chitterlings, fatback, hot dogs, bratwurst,  and packaged luncheon meats. Salted nuts and seeds. Canned beans with salt. Dairy Whole or 2% milk, cream, half-and-half, and cream cheese. Whole-fat or sweetened yogurt. Full-fat cheeses or blue cheese. Nondairy creamers and whipped toppings. Processed cheese, cheese spreads, or cheese curds. Condiments Onion and garlic salt, seasoned salt, table salt, and sea salt. Canned and packaged gravies. Worcestershire sauce. Tartar sauce. Barbecue sauce. Teriyaki sauce. Soy sauce, including reduced sodium. Steak sauce. Fish sauce. Oyster sauce. Cocktail sauce. Horseradish. Ketchup and mustard. Meat flavorings and tenderizers. Bouillon cubes. Hot sauce. Tabasco sauce. Marinades. Taco seasonings. Relishes. Fats and Oils Butter, stick margarine, lard, shortening, ghee, and bacon fat. Coconut, palm kernel, or palm oils. Regular salad dressings. Other Pickles and olives. Salted popcorn and pretzels. The items listed above may not be a complete list of foods and beverages to avoid. Contact your dietitian for more information. WHERE CAN I FIND MORE INFORMATION? National Heart, Lung, and Blood Institute: travelstabloid.com Document Released: 10/08/2011 Document Revised: 03/05/2014 Document Reviewed: 08/23/2013 [facility] Patient Information 2015 [place], [place]. This information is not intended to replace advice given to you by your health care provider. Make sure you  discuss any questions you have with your health care provider.

## 2020-03-20 LAB — LIPID PANEL
Cholesterol: 141 mg/dL (ref <200)
HDL: 73 mg/dL (ref >=50)
LDL Cholesterol (Calc): 51 mg/dL (calc)
Non-HDL Cholesterol (Calc): 68 mg/dL (calc) (ref <130)
Total CHOL/HDL Ratio: 1.9 (calc) (ref <5.0)
Triglycerides: 86 mg/dL (ref <150)

## 2020-03-20 LAB — COMPLETE METABOLIC PANEL WITH GFR
AG Ratio: 1.8 (calc) (ref 1.0–2.5)
ALT: 18 U/L (ref 6–29)
AST: 25 U/L (ref 10–35)
Albumin: 4.8 g/dL (ref 3.6–5.1)
Alkaline phosphatase (APISO): 36 U/L — ABNORMAL LOW (ref 37–153)
BUN: 13 mg/dL (ref 7–25)
CO2: 29 mmol/L (ref 20–32)
Calcium: 10.2 mg/dL (ref 8.6–10.4)
Chloride: 104 mmol/L (ref 98–110)
Creat: 0.83 mg/dL (ref 0.60–0.93)
GFR, Est African American: 83 mL/min/{1.73_m2} (ref >=60)
GFR, Est Non African American: 71 mL/min/{1.73_m2} (ref >=60)
Globulin: 2.6 g/dL (calc) (ref 1.9–3.7)
Glucose, Bld: 95 mg/dL (ref 65–99)
Potassium: 4.2 mmol/L (ref 3.5–5.3)
Sodium: 141 mmol/L (ref 135–146)
Total Bilirubin: 0.7 mg/dL (ref 0.2–1.2)
Total Protein: 7.4 g/dL (ref 6.1–8.1)

## 2020-03-20 LAB — CBC WITH DIFFERENTIAL/PLATELET
Absolute Monocytes: 297 cells/uL (ref 200–950)
Basophils Absolute: 28 cells/uL (ref 0–200)
Basophils Relative: 0.5 %
Eosinophils Absolute: 28 cells/uL (ref 15–500)
Eosinophils Relative: 0.5 %
HCT: 39.8 % (ref 35.0–45.0)
Hemoglobin: 13.5 g/dL (ref 11.7–15.5)
Lymphs Abs: 1736 cells/uL (ref 850–3900)
MCH: 31.3 pg (ref 27.0–33.0)
MCHC: 33.9 g/dL (ref 32.0–36.0)
MCV: 92.1 fL (ref 80.0–100.0)
MPV: 9.7 fL (ref 7.5–12.5)
Monocytes Relative: 5.3 %
Neutro Abs: 3511 cells/uL (ref 1500–7800)
Neutrophils Relative %: 62.7 %
Platelets: 329 10*3/uL (ref 140–400)
RBC: 4.32 10*6/uL (ref 3.80–5.10)
RDW: 12.3 % (ref 11.0–15.0)
Total Lymphocyte: 31 %
WBC: 5.6 10*3/uL (ref 3.8–10.8)

## 2020-03-20 LAB — MAGNESIUM: Magnesium: 2 mg/dL (ref 1.5–2.5)

## 2020-03-20 LAB — TSH: TSH: 1.72 mIU/L (ref 0.40–4.50)

## 2020-03-27 ENCOUNTER — Ambulatory Visit
Admission: RE | Admit: 2020-03-27 | Discharge: 2020-03-27 | Disposition: A | Discharge status: Home / Self Care | Payer: PPO | Source: Ambulatory Visit | Attending: Adult Health | Admitting: Adult Health

## 2020-03-27 ENCOUNTER — Other Ambulatory Visit: Payer: Self-pay

## 2020-03-27 DIAGNOSIS — I10 Essential (primary) hypertension: Secondary | ICD-10-CM | POA: Diagnosis not present

## 2020-03-27 DIAGNOSIS — Z1231 Encounter for screening mammogram for malignant neoplasm of breast: Secondary | ICD-10-CM | POA: Diagnosis not present

## 2020-03-27 DIAGNOSIS — Z87891 Personal history of nicotine dependence: Secondary | ICD-10-CM

## 2020-03-27 DIAGNOSIS — R0989 Other specified symptoms and signs involving the circulatory and respiratory systems: Secondary | ICD-10-CM

## 2020-03-27 LAB — HM MAMMOGRAPHY

## 2020-03-29 ENCOUNTER — Encounter: Payer: Self-pay | Admitting: *Deleted

## 2020-07-15 ENCOUNTER — Telehealth: Payer: Self-pay | Admitting: *Deleted

## 2020-07-15 NOTE — Telephone Encounter (Signed)
Patient called and states she had abdominal pain, along with back pain, and indigestion. Patient took Protonix, with relief. Patient ate out yesterday with her daughter and friends. Daughter having diarrhea, but patient has not experienced diarrhea. States she has eaten oatmeal and feels better. Per Dr Melford Aase, just monitor and call if needed.

## 2020-07-17 ENCOUNTER — Other Ambulatory Visit: Payer: Self-pay | Admitting: Internal Medicine

## 2020-07-17 DIAGNOSIS — E782 Mixed hyperlipidemia: Secondary | ICD-10-CM

## 2020-07-17 MED ORDER — ROSUVASTATIN CALCIUM 20 MG PO TABS: ORAL_TABLET | ORAL | 0 refills | Status: DC

## 2020-08-12 ENCOUNTER — Other Ambulatory Visit: Payer: Self-pay | Admitting: Internal Medicine

## 2020-08-12 DIAGNOSIS — E039 Hypothyroidism, unspecified: Secondary | ICD-10-CM

## 2020-08-12 MED ORDER — LEVOTHYROXINE SODIUM 50 MCG PO TABS: ORAL_TABLET | ORAL | 3 refills | Status: DC

## 2020-09-15 ENCOUNTER — Encounter: Payer: Self-pay | Admitting: Internal Medicine

## 2020-09-15 NOTE — Patient Instructions (Signed)
Due to recent changes in healthcare laws, you may see the results of your imaging and laboratory studies on MyChart before your provider has had a chance to review them.  We understand that in some cases there may be results that are confusing or concerning to you. Not all laboratory results come back in the same time frame and the provider may be waiting for multiple results in order to interpret others.  Please give [place] 48 hours in order for your provider to thoroughly review all the results before contacting the office for clarification of your results.   +++++++++++++++++++++++++  Vit D  & Vit C 1,000 mg   are recommended to help protect  against the Covid-19 and other Corona viruses.    Also it's recommended  to take  Zinc 50 mg  to help  protect against the Covid-19   and best place to get  is also on [email or web address]  and don't pay more than 6-8 cents /pill !  ================================ Coronavirus (COVID-19) Are you at risk?  Are you at risk for the Coronavirus (COVID-19)?  To be considered HIGH RISK for Coronavirus (COVID-19), you have to meet the following criteria:  . Traveled to [place], [place], [place], [place] or [place]; or in the [place] to [place], [place], [place]  . or [place]; and have fever, cough, and shortness of breath within the last 2 weeks of travel OR . Been in close contact with a person diagnosed with COVID-19 within the last 2 weeks and have  . fever, cough,and shortness of breath .  . IF YOU DO NOT MEET THESE CRITERIA, YOU ARE CONSIDERED LOW RISK FOR COVID-19.  What to do if you are HIGH RISK for COVID-19?  Marland Kitchen If you are having a medical emergency, call 911. . Seek medical care right away. Before you go to a doctor's office, urgent care or emergency department, .  call ahead and tell them about your recent travel, contact with someone diagnosed with COVID-19  .  and your symptoms.  . You should receive instructions from your physician's  office regarding next steps of care.  . When you arrive at healthcare provider, tell the healthcare staff immediately you have returned from  . visiting [place], [place], [place], [place] or [place]; or traveled in the [place] to [place], [place],  . [place] or [place] in the last two weeks or you have been in close contact with a person diagnosed with  . COVID-19 in the last 2 weeks.   . Tell the health care staff about your symptoms: fever, cough and shortness of breath. . After you have been seen by a medical provider, you will be either: o Tested for (COVID-19) and discharged home on quarantine except to seek medical care if  o symptoms worsen, and asked to  - Stay home and avoid contact with others until you get your results (4-5 days)  - Avoid travel on public transportation if possible (such as bus, train, or airplane) or o Sent to the Emergency Department by EMS for evaluation, COVID-19 testing  and  o possible admission depending on your condition and test results.  What to do if you are LOW RISK for COVID-19?  Reduce your risk of any infection by using the same precautions used for avoiding the common cold or flu:  Marland Kitchen Wash your hands often with soap and warm water for at least 20 seconds.  If soap and water are not readily  available,  . use an alcohol-based hand sanitizer with at least 60% alcohol.  . If coughing or sneezing, cover your mouth and nose by coughing or sneezing into the elbow areas of your shirt or coat, .  into a tissue or into your sleeve (not your hands). . Avoid shaking hands with others and consider head nods or verbal greetings only. . Avoid touching your eyes, nose, or mouth with unwashed hands.  . Avoid close contact with people who are sick. . Avoid places or events with large numbers of people in one location, like concerts or sporting events. . Carefully consider travel plans you have or are making. . If you are planning any travel outside or  inside the [place], visit the CDC's Travelers' Health webpage for the latest health notices. . If you have some symptoms but not all symptoms, continue to monitor at home and seek medical attention  . if your symptoms worsen. . If you are having a medical emergency, call 911.   . >>>>>>>>>>>>>>>>>>>>>>>>>>>>>>>>> . We Do NOT Approve of  [facility], [employer]  Our Patients  To Do Home Visits & We Do NOT Approve of [employer] > > > > > > > > > > > > > > > > > > > > > > > > > > > > > > > > > > > > > > >  Preventive Care for Adults  A healthy lifestyle and preventive care can promote health and wellness. Preventive health guidelines for women include the following key practices.  A routine yearly physical is a good way to check with your health care provider about your health and preventive screening. It is a chance to share any concerns and updates on your health and to receive a thorough exam.  Visit your dentist for a routine exam and preventive care every 6 months. Brush your teeth twice a day and floss once a day. Good oral hygiene prevents tooth decay and gum disease.  The frequency of eye exams is based on your age, health, family medical history, use of contact lenses, and other factors. Follow your health care provider's recommendations for frequency of eye exams.  Eat a healthy diet. Foods like vegetables, fruits, whole grains, low-fat dairy products, and lean protein foods contain the nutrients you need without too many calories. Decrease your intake of foods high in solid fats, added sugars, and salt. Eat the right amount of calories for you. Get information about a proper diet from your health care provider, if necessary.  Regular physical exercise is one of the most important things you can do for your health. Most adults should get at least 150 minutes of moderate-intensity exercise (any activity that increases your heart rate and causes you to sweat) each  week. In addition, most adults need muscle-strengthening exercises on 2 or more days a week.  Maintain a healthy weight. The body mass index (BMI) is a screening tool to identify possible weight problems. It provides an estimate of body fat based on height and weight. Your health care provider can find your BMI and can help you achieve or maintain a healthy weight. For adults 20 years and older:  A BMI below 18.5 is considered underweight.  A BMI of 18.5 to 24.9 is normal.  A BMI of 25 to 29.9 is considered overweight.  A BMI of 30 and above is considered obese.  Maintain normal blood lipids and cholesterol levels by exercising and minimizing your intake of  saturated fat. Eat a balanced diet with plenty of fruit and vegetables. If your lipid or cholesterol levels are high, you are over 50, or you are at high risk for heart disease, you may need your cholesterol levels checked more frequently. Ongoing high lipid and cholesterol levels should be treated with medicines if diet and exercise are not working.  If you smoke, find out from your health care provider how to quit. If you do not use tobacco, do not start.  Lung cancer screening is recommended for adults aged 78-80 years who are at high risk for developing lung cancer because of a history of smoking. A yearly low-dose CT scan of the lungs is recommended for people who have at least a 30-pack-year history of smoking and are a current smoker or have quit within the past 15 years. A pack year of smoking is smoking an average of 1 pack of cigarettes a day for 1 year (for example: 1 pack a day for 30 years or 2 packs a day for 15 years). Yearly screening should continue until the smoker has stopped smoking for at least 15 years. Yearly screening should be stopped for people who develop a health problem that would prevent them from having lung cancer treatment.  Avoid use of street drugs. Do not share needles with anyone. Ask for help if you need  support or instructions about stopping the use of drugs.  High blood pressure causes heart disease and increases the risk of stroke.  Ongoing high blood pressure should be treated with medicines if weight loss and exercise do not work.  If you are 14-45 years old, ask your health care provider if you should take aspirin to prevent strokes.  Diabetes screening involves taking a blood sample to check your fasting blood sugar level. This should be done once every 3 years, after age 40, if you are within normal weight and without risk factors for diabetes. Testing should be considered at a younger age or be carried out more frequently if you are overweight and have at least 1 risk factor for diabetes.  Breast cancer screening is essential preventive care for women. You should practice "breast self-awareness." This means understanding the normal appearance and feel of your breasts and may include breast self-examination. Any changes detected, no matter how small, should be reported to a health care provider. Women in their 18s and 30s should have a clinical breast exam (CBE) by a health care provider as part of a regular health exam every 1 to 3 years. After age 71, women should have a CBE every year. Starting at age 39, women should consider having a mammogram (breast X-ray test) every year. Women who have a family history of breast cancer should talk to their health care provider about genetic screening. Women at a high risk of breast cancer should talk to their health care providers about having an MRI and a mammogram every year.  Breast cancer gene (BRCA)-related cancer risk assessment is recommended for women who have family members with BRCA-related cancers. BRCA-related cancers include breast, ovarian, tubal, and peritoneal cancers. Having family members with these cancers may be associated with an increased risk for harmful changes (mutations) in the breast cancer genes BRCA1 and BRCA2. Results of the  assessment will determine the need for genetic counseling and BRCA1 and BRCA2 testing.  Routine pelvic exams to screen for cancer are no longer recommended for nonpregnant women who are considered low risk for cancer of the pelvic organs (ovaries,  uterus, and vagina) and who do not have symptoms. Ask your health care provider if a screening pelvic exam is right for you.  If you have had past treatment for cervical cancer or a condition that could lead to cancer, you need Pap tests and screening for cancer for at least 20 years after your treatment. If Pap tests have been discontinued, your risk factors (such as having a new sexual partner) need to be reassessed to determine if screening should be resumed. Some women have medical problems that increase the chance of getting cervical cancer. In these cases, your health care provider may recommend more frequent screening and Pap tests.    Colorectal cancer can be detected and often prevented. Most routine colorectal cancer screening begins at the age of 62 years and continues through age 28 years. However, your health care provider may recommend screening at an earlier age if you have risk factors for colon cancer. On a yearly basis, your health care provider may provide home test kits to check for hidden blood in the stool. Use of a small camera at the end of a tube, to directly examine the colon (sigmoidoscopy or colonoscopy), can detect the earliest forms of colorectal cancer. Talk to your health care provider about this at age 23, when routine screening begins.  Direct exam of the colon should be repeated every 5-10 years through age 31 years, unless early forms of pre-cancerous polyps or small growths are found.  Osteoporosis is a disease in which the bones lose minerals and strength with aging. This can result in serious bone fractures or breaks. The risk of osteoporosis can be identified using a bone density scan. Women ages 83 years and over and women  at risk for fractures or osteoporosis should discuss screening with their health care providers. Ask your health care provider whether you should take a calcium supplement or vitamin D to reduce the rate of osteoporosis.  Menopause can be associated with physical symptoms and risks. Hormone replacement therapy is available to decrease symptoms and risks. You should talk to your health care provider about whether hormone replacement therapy is right for you.  Use sunscreen. Apply sunscreen liberally and repeatedly throughout the day. You should seek shade when your shadow is shorter than you. Protect yourself by wearing long sleeves, pants, a wide-brimmed hat, and sunglasses year round, whenever you are outdoors.  Once a month, do a whole body skin exam, using a mirror to look at the skin on your back. Tell your health care provider of new moles, moles that have irregular borders, moles that are larger than a pencil eraser, or moles that have changed in shape or color.  Stay current with required vaccines (immunizations).  Influenza vaccine. All adults should be immunized every year.  Tetanus, diphtheria, and acellular pertussis (Td, Tdap) vaccine. Pregnant women should receive 1 dose of Tdap vaccine during each pregnancy. The dose should be obtained regardless of the length of time since the last dose. Immunization is preferred during the 27th-36th week of gestation. An adult who has not previously received Tdap or who does not know her vaccine status should receive 1 dose of Tdap. This initial dose should be followed by tetanus and diphtheria toxoids (Td) booster doses every 10 years. Adults with an unknown or incomplete history of completing a 3-dose immunization series with Td-containing vaccines should begin or complete a primary immunization series including a Tdap dose. Adults should receive a Td booster every 10 years.  Zoster vaccine. One dose is recommended for adults aged 64 years or  older unless certain conditions are present.    Pneumococcal 13-valent conjugate (PCV13) vaccine. When indicated, a person who is uncertain of her immunization history and has no record of immunization should receive the PCV13 vaccine. An adult aged 61 years or older who has certain medical conditions and has not been previously immunized should receive 1 dose of PCV13 vaccine. This PCV13 should be followed with a dose of pneumococcal polysaccharide (PPSV23) vaccine. The PPSV23 vaccine dose should be obtained at least 1 or more year(s) after the dose of PCV13 vaccine. An adult aged 46 years or older who has certain medical conditions and previously received 1 or more doses of PPSV23 vaccine should receive 1 dose of PCV13. The PCV13 vaccine dose should be obtained 1 or more years after the last PPSV23 vaccine dose.    Pneumococcal polysaccharide (PPSV23) vaccine. When PCV13 is also indicated, PCV13 should be obtained first. All adults aged 58 years and older should be immunized. An adult younger than age 1 years who has certain medical conditions should be immunized. Any person who resides in a nursing home or long-term care facility should be immunized. An adult smoker should be immunized. People with an immunocompromised condition and certain other conditions should receive both PCV13 and PPSV23 vaccines. People with human immunodeficiency virus (HIV) infection should be immunized as soon as possible after diagnosis. Immunization during chemotherapy or radiation therapy should be avoided. Routine use of PPSV23 vaccine is not recommended for American Indians, [place] Natives, or people younger than 65 years unless there are medical conditions that require PPSV23 vaccine. When indicated, people who have unknown immunization and have no record of immunization should receive PPSV23 vaccine. One-time revaccination 5 years after the first dose of PPSV23 is recommended for people aged 19-64 years who have chronic  kidney failure, nephrotic syndrome, asplenia, or immunocompromised conditions. People who received 1-2 doses of PPSV23 before age 11 years should receive another dose of PPSV23 vaccine at age 70 years or later if at least 5 years have passed since the previous dose. Doses of PPSV23 are not needed for people immunized with PPSV23 at or after age 65 years.   Preventive Services / Frequency  Ages 56 years and over  Blood pressure check.  Lipid and cholesterol check.  Lung cancer screening. / Every year if you are aged 16-80 years and have a 30-pack-year history of smoking and currently smoke or have quit within the past 15 years. Yearly screening is stopped once you have quit smoking for at least 15 years or develop a health problem that would prevent you from having lung cancer treatment.  Clinical breast exam.** / Every year after age 59 years.   BRCA-related cancer risk assessment.** / For women who have family members with a BRCA-related cancer (breast, ovarian, tubal, or peritoneal cancers).  Mammogram.** / Every year beginning at age 70 years and continuing for as long as you are in good health. Consult with your health care provider.  Pap test.** / Every 3 years starting at age 32 years through age 28 or 9 years with 3 consecutive normal Pap tests. Testing can be stopped between 65 and 70 years with 3 consecutive normal Pap tests and no abnormal Pap or HPV tests in the past 10 years.  Fecal occult blood test (FOBT) of stool. / Every year beginning at age 57 years and continuing until age 29 years. You may not need to  do this test if you get a colonoscopy every 10 years.  Flexible sigmoidoscopy or colonoscopy.** / Every 5 years for a flexible sigmoidoscopy or every 10 years for a colonoscopy beginning at age 21 years and continuing until age 56 years.  Hepatitis C blood test.** / For all people born from [birth year] through 1966 and any individual with known risks for hepatitis  C.  Osteoporosis screening.** / A one-time screening for women ages 53 years and over and women at risk for fractures or osteoporosis.  Skin self-exam. / Monthly.  Influenza vaccine. / Every year.  Tetanus, diphtheria, and acellular pertussis (Tdap/Td) vaccine.** / 1 dose of Td every 10 years.  Zoster vaccine.** / 1 dose for adults aged 32 years or older.  Pneumococcal 13-valent conjugate (PCV13) vaccine.** / Consult your health care provider.  Pneumococcal polysaccharide (PPSV23) vaccine.** / 1 dose for all adults aged 67 years and older. Screening for abdominal aortic aneurysm (AAA)  by ultrasound is recommended for people who have history of high blood pressure or who are current or former smokers. ++++++++++++++++++++ Recommend Adult Low Dose Aspirin or  coated  Aspirin 81 mg daily  To reduce risk of Colon Cancer 40 %,  Skin Cancer 26 % ,  Melanoma 46%  and  Pancreatic cancer 60% ++++++++++++++++++++ Vitamin D goal  is between 70-100.  Please make sure that you are taking your Vitamin D as directed.  It is very important as a natural anti-inflammatory  helping hair, skin, and nails, as well as reducing stroke and heart attack risk.  It helps your bones and helps with mood. It also decreases numerous cancer risks so please take it as directed.  Low Vit D is associated with a 200-300% higher risk for CANCER  and 200-300% higher risk for HEART   ATTACK  &  STROKE.   .....................................Marland Kitchen It is also associated with higher death rate at younger ages,  autoimmune diseases like Rheumatoid arthritis, Lupus, Multiple Sclerosis.    Also many other serious conditions, like depression, Alzheimer's Dementia, infertility, muscle aches, fatigue, fibromyalgia - just to name a few. ++++++++++++++++++ Recommend the book "The END of DIETING" by Dr Excell Seltzer  & the book "The END of DIABETES " by Dr Excell Seltzer At [email or web address] - get book & Audio CD's    Being diabetic has  a  300% increased risk for heart attack, stroke, cancer, and alzheimer- type vascular dementia. It is very important that you work harder with diet by avoiding all foods that are white. Avoid white rice (brown & wild rice is OK), white potatoes (sweetpotatoes in moderation is OK), White bread or wheat bread or anything made out of white flour like bagels, donuts, rolls, buns, biscuits, cakes, pastries, cookies, pizza crust, and pasta (made from white flour & egg whites) - vegetarian pasta or spinach or wheat pasta is OK. Multigrain breads like Arnold's or [employer], or multigrain sandwich thins or flatbreads.  Diet, exercise and weight loss can reverse and cure diabetes in the early stages.  Diet, exercise and weight loss is very important in the control and prevention of complications of diabetes which affects every system in your body, ie. Brain - dementia/stroke, eyes - glaucoma/blindness, heart - heart attack/heart failure, kidneys - dialysis, stomach - gastric paralysis, intestines - malabsorption, nerves - severe painful neuritis, circulation - gangrene & loss of a leg(s), and finally cancer and Alzheimers.    I recommend avoid fried & greasy foods,  sweets/candy, white rice (brown or wild  rice or Quinoa is OK), white potatoes (sweet potatoes are OK) - anything made from white flour - bagels, doughnuts, rolls, buns, biscuits,white and wheat breads, pizza crust and traditional pasta made of white flour & egg white(vegetarian pasta or spinach or wheat pasta is OK).  Multi-grain bread is OK - like multi-grain flat bread or sandwich thins. Avoid alcohol in excess. Exercise is also important.    Eat all the vegetables you want - avoid meat, especially red meat and dairy - especially cheese.  Cheese is the most concentrated form of trans-fats which is the worst thing to clog up our arteries. Veggie cheese is OK which can be found in the fresh produce section at [employer] or [employer] or  [employer]  +++++++++++++++++++ DASH Eating Plan  DASH stands for "Dietary Approaches to Stop Hypertension."   The DASH eating plan is a healthy eating plan that has been shown to reduce high blood pressure (hypertension). Additional health benefits may include reducing the risk of type 2 diabetes mellitus, heart disease, and stroke. The DASH eating plan may also help with weight loss. WHAT DO I NEED TO KNOW ABOUT THE DASH EATING PLAN? For the DASH eating plan, you will follow these general guidelines:  Choose foods with a percent daily value for sodium of less than 5% (as listed on the food label).  Use salt-free seasonings or herbs instead of table salt or sea salt.  Check with your health care provider or pharmacist before using salt substitutes.  Eat lower-sodium products, often labeled as "lower sodium" or "no salt added."  Eat fresh foods.  Eat more vegetables, fruits, and low-fat dairy products.  Choose whole grains. Look for the word "whole" as the first word in the ingredient list.  Choose fish   Limit sweets, desserts, sugars, and sugary drinks.  Choose heart-healthy fats.  Eat veggie cheese   Eat more home-cooked food and less restaurant, buffet, and fast food.  Limit fried foods.  Cook foods using methods other than frying.  Limit canned vegetables. If you do use them, rinse them well to decrease the sodium.  When eating at a restaurant, ask that your food be prepared with less salt, or no salt if possible.                      WHAT FOODS CAN I EAT? Read Dr Fara Olden Fuhrman's books on The End of Dieting & The End of Diabetes  Grains Whole grain or whole wheat bread. Brown rice. Whole grain or whole wheat pasta. Quinoa, bulgur, and whole grain cereals. Low-sodium cereals. Corn or whole wheat flour tortillas. Whole grain cornbread. Whole grain crackers. Low-sodium crackers.  Vegetables Fresh or frozen vegetables (raw, steamed, roasted, or grilled). Low-sodium or  reduced-sodium tomato and vegetable juices. Low-sodium or reduced-sodium tomato sauce and paste. Low-sodium or reduced-sodium canned vegetables.   Fruits All fresh, canned (in natural juice), or frozen fruits.  Protein Products  All fish and seafood.  Dried beans, peas, or lentils. Unsalted nuts and seeds. Unsalted canned beans.  Dairy Low-fat dairy products, such as skim or 1% milk, 2% or reduced-fat cheeses, low-fat ricotta or cottage cheese, or plain low-fat yogurt. Low-sodium or reduced-sodium cheeses.  Fats and Oils Tub margarines without trans fats. Light or reduced-fat mayonnaise and salad dressings (reduced sodium). Avocado. Safflower, olive, or canola oils. Natural peanut or almond butter.  Other Unsalted popcorn and pretzels. The items listed above may not be a complete list of recommended foods  or beverages. Contact your dietitian for more options.  +++++++++++++++  WHAT FOODS ARE NOT RECOMMENDED? Grains/ White flour or wheat flour White bread. White pasta. White rice. Refined cornbread. Bagels and croissants. Crackers that contain trans fat.  Vegetables  Creamed or fried vegetables. Vegetables in a . Regular canned vegetables. Regular canned tomato sauce and paste. Regular tomato and vegetable juices.  Fruits Dried fruits. Canned fruit in light or heavy syrup. Fruit juice.  Meat and Other Protein Products Meat in general - RED meat & White meat.  Fatty cuts of meat. Ribs, chicken wings, all processed meats as bacon, sausage, bologna, salami, fatback, hot dogs, bratwurst and packaged luncheon meats.  Dairy Whole or 2% milk, cream, half-and-half, and cream cheese. Whole-fat or sweetened yogurt. Full-fat cheeses or blue cheese. Non-dairy creamers and whipped toppings. Processed cheese, cheese spreads, or cheese curds.  Condiments Onion and garlic salt, seasoned salt, table salt, and sea salt. Canned and packaged gravies. Worcestershire sauce. Tartar sauce. Barbecue  sauce. Teriyaki sauce. Soy sauce, including reduced sodium. Steak sauce. Fish sauce. Oyster sauce. Cocktail sauce. Horseradish. Ketchup and mustard. Meat flavorings and tenderizers. Bouillon cubes. Hot sauce. Tabasco sauce. Marinades. Taco seasonings. Relishes.  Fats and Oils Butter, stick margarine, lard, shortening and bacon fat. Coconut, palm kernel, or palm oils. Regular salad dressings.  Pickles and olives. Salted popcorn and pretzels.  The items listed above may not be a complete list of foods and beverages to avoid.

## 2020-09-15 NOTE — Progress Notes (Signed)
Annual Screening/Preventative Visit & Comprehensive Evaluation &  Examination     This very nice 69 y.o.  DWF  presents for a Screening /Preventative Visit & comprehensive evaluation and management of multiple medical co-morbidities.  Patient has been followed for HTN, HLD, Hypothyroidism, Prediabetes  and Vitamin D Deficiency.  Patient's GERD is NOT controlled on her Pantoprazole supplementing with TUMS  & still having evening & night time break thru.       Patient has been followed since 2007 expectantly for labile HTN.  (Last year in Oct 2020, patient's  BP was elevated at 162/84 and rechecked x 3 at 170-180 / 90-100 ).  Patient's BP has been controlled at home and patient denies any cardiac symptoms as chest pain, palpitations, shortness of breath, dizziness or ankle swelling. Today's BP is at goal - 132/76.        Patient's hyperlipidemia is controlled with diet and mRosuvastatin. Patient denies myalgias or other medication SE's. Last lipids were at goal:  Lab Results  Component Value Date   CHOL 141 03/19/2020   HDL 73 03/19/2020   LDLCALC 51 03/19/2020   TRIG 86 03/19/2020   CHOLHDL 1.9 03/19/2020       Patient has hx/o prediabetes (A1c 5.7% /2012)  and patient denies reactive hypoglycemic symptoms, visual blurring, diabetic polys or paresthesias. Last A1c was Normal & At goal:  Lab Results  Component Value Date   HGBA1C 5.4 12/05/2019       Patienthas been on Thyroid Replacement since 2001.      Finally, patient has history of Vitamin D Deficiency("46" ontx /2017) and last Vitamin D was at goal:  Lab Results  Component Value Date   VD25OH 55 12/05/2019    Current Outpatient Medications on File Prior to Visit  Medication Sig  . OCUVITE Take 1 tablet  daily.  Marland Kitchen VITAMIN D  5000 units  Take daily.  Marland Kitchen levothyroxine  50 MCG  Take      1 tablet      Daily   . pantoprazole40 MG  TAKE 1 TABLET  DAILY AS NEEDED   . rosuvastatin  20 MG  Takes 1 tablet every other day  for Cholesterol    Allergies  Allergen Reactions  . Codeine Nausea And Vomiting and Other (See Comments)    Migraine    Past Medical History:  Diagnosis Date  . Adenomatous colon polyp   . Allergy   . GERD (gastroesophageal reflux disease)   . Heart murmur    history of as teenager   . IBS (irritable bowel syndrome)   . Labile hypertension   . Macular degeneration of left eye   . RLS (restless legs syndrome)   . Thyroid disease    hypothyroid  . Vitamin D deficiency    Health Maintenance  Topic Date Due  . INFLUENZA VACCINE  06/02/2020  . MAMMOGRAM  03/27/2021  . COLONOSCOPY  09/21/2022  . TETANUS/TDAP  12/27/2028  . DEXA SCAN  Completed  . COVID-19 Vaccine  Completed  . Hepatitis C Screening  Completed  . PNA vac Low Risk Adult  Completed   Immunization History  Administered Date(s) Administered  . Influenza, High Dose Seasonal PF 08/01/2018  . Influenza-Unspecified 07/29/2016  . Moderna SARS-COVID-2 Vaccination 11/29/2019, 12/27/2019  . Pneumococcal Conjugate-13 08/19/2015  . Pneumococcal Polysaccharide-23 11/09/2012, 08/01/2018  . Td 12/27/2018  . Tdap 10/18/2008    Last Colon - 09/21/2017 - Dr Henrene Pastor recc 5 yr f/u due Nov/Dec 2023  Last  MGM - 03/27/2020  Past Surgical History:  Procedure Laterality Date  . COLONOSCOPY    . POLYPECTOMY    . TENDON RECONSTRUCTION Right 04/27/2013   Procedure: RIGHT ELBOW DEBRIDE/REPAIR/EXPLORE - LATERAL HUMERAL EPICONDYLE;  Surgeon: Ninetta Lights, MD;  Location: [facility];  Service: Orthopedics;  Laterality: Right;  . TUBAL LIGATION     Family History  Problem Relation Age of Onset  . Hypertension Father   . Cerebral aneurysm Father   . Alzheimer's disease Mother   . Stomach cancer Paternal Grandmother 46  . Colon cancer Neg Hx   . Esophageal cancer Neg Hx   . Rectal cancer Neg Hx    Social History   Tobacco Use  . Smoking status: Former Smoker    Packs/day: 2.00    Years: 14.00    Pack  years: 28.00    Start date: 11/02/1966    Quit date: 04/24/1981    Years since quitting: 39.4  . Smokeless tobacco: Never Used  Vaping Use  . Vaping Use: Never used  Substance Use Topics  . Alcohol use: Yes    Alcohol/week: 3.0 standard drinks    Types: 3 Glasses of wine per week    Comment: occ  . Drug use: No    ROS Constitutional: Denies fever, chills, weight loss/gain, headaches, insomnia,  night sweats, and change in appetite. Does c/o fatigue. Eyes: Denies redness, blurred vision, diplopia, discharge, itchy, watery eyes.  ENT: Denies discharge, congestion, post nasal drip, epistaxis, sore throat, earache, hearing loss, dental pain, Tinnitus, Vertigo, Sinus pain, snoring.  Cardio: Denies chest pain, palpitations, irregular heartbeat, syncope, dyspnea, diaphoresis, orthopnea, PND, claudication, edema Respiratory: denies cough, dyspnea, DOE, pleurisy, hoarseness, laryngitis, wheezing.  Gastrointestinal: Denies dysphagia, heartburn, reflux, water brash, pain, cramps, nausea, vomiting, bloating, diarrhea, constipation, hematemesis, melena, hematochezia, jaundice, hemorrhoids Genitourinary: Denies dysuria, frequency, urgency, nocturia, hesitancy, discharge, hematuria, flank pain Breast: Breast lumps, nipple discharge, bleeding.  Musculoskeletal: Denies arthralgia, myalgia, stiffness, Jt. Swelling, pain, limp, and strain/sprain. Denies falls. Skin: Denies puritis, rash, hives, warts, acne, eczema, changing in skin lesion Neuro: No weakness, tremor, incoordination, spasms, paresthesia, pain Psychiatric: Denies confusion, memory loss, sensory loss. Denies Depression. Endocrine: Denies change in weight, skin, hair change, nocturia, and paresthesia, diabetic polys, visual blurring, hyper / hypo glycemic episodes.  Heme/Lymph: No excessive bleeding, bruising, enlarged lymph nodes.  Physical Exam  BP 132/76   Pulse 70   Temp (!) 97 F (36.1 C)   Resp 16   Ht 5\' 3"  (1.6 m)   Wt 145 lb  (65.8 kg)   SpO2 99%   BMI 25.69 kg/m   General Appearance: Well nourished, well groomed and in no apparent distress.  Eyes: PERRLA, EOMs, conjunctiva no swelling or erythema, normal fundi and vessels. Sinuses: No frontal/maxillary tenderness ENT/Mouth: EACs patent / TMs  nl. Nares clear without erythema, swelling, mucoid exudates. Oral hygiene is good. No erythema, swelling, or exudate. Tongue normal, non-obstructing. Tonsils not swollen or erythematous. Hearing normal.  Neck: Supple, thyroid not palpable. No bruits, nodes or JVD. Respiratory: Respiratory effort normal.  BS equal and clear bilateral without rales, rhonci, wheezing or stridor. Cardio: Heart sounds are normal with regular rate and rhythm and no murmurs, rubs or gallops. Peripheral pulses are normal and equal bilaterally without edema. No aortic or femoral bruits. Chest: symmetric with normal excursions and percussion. Breasts: Symmetric, without lumps, nipple discharge, retractions, or fibrocystic changes.  Abdomen: Flat, soft with bowel sounds active. Nontender, no guarding, rebound, hernias, masses,  or organomegaly.  Lymphatics: Non tender without lymphadenopathy.  Musculoskeletal: Full ROM all peripheral extremities, joint stability, 5/5 strength, and normal gait. Skin: Warm and dry without rashes, lesions, cyanosis, clubbing or  ecchymosis.  Neuro: Cranial nerves intact, reflexes equal bilaterally. Normal muscle tone, no cerebellar symptoms. Sensation intact.  Pysch: Alert and oriented X 3, normal affect, Insight and Judgment appropriate.   Assessment and Plan  1. Annual Preventative Screening Examination  2. Labile hypertension  - EKG 12-Lead - Urinalysis, Routine w reflex microscopic - Microalbumin / creatinine urine ratio - CBC with Differential/Platelet - COMPLETE METABOLIC PANEL WITH GFR - Magnesium - TSH  3. Hyperlipidemia, mixed  - EKG 12-Lead - Lipid panel - TSH  4. Abnormal glucose  - EKG  12-Lead - Hemoglobin A1c - Insulin, random  5. Vitamin D deficiency  - VITAMIN D 25 Hydroxy  6. Hypothyroidism, unspecified type  - TSH  7. Gastroesophageal reflux disease  - Recommended change dosing to Lunchtime to  avoid conflict with her thyroid meds  & also add Pepcid 40 mg at Bedtime for break thru  - CBC with Differential/Platelet  8. Prediabetes  - EKG 12-Lead - Hemoglobin A1c - Insulin, random  9. Encounter for colorectal cancer screening  - POC Hemoccult Bld/Stl   10. Screening for ischemic heart disease  - EKG 12-Lead  11. FH: hypertension  - EKG 12-Lead  12. Former smoker  - EKG 12-Lead  13. Medication management  - Urinalysis, Routine w reflex microscopic - Microalbumin / creatinine urine ratio - CBC with Differential/Platelet - COMPLETE METABOLIC PANEL WITH GFR - Magnesium - Lipid panel - TSH - Hemoglobin A1c - Insulin, random - VITAMIN D 25 Hydroxy          Patient was counseled in prudent diet to achieve/maintain BMI less than 25 for weight control, BP monitoring, regular exercise and medications. Discussed med's effects and SE's. Screening labs and tests as requested with regular follow-up as recommended. Over 40 minutes of exam, counseling, chart review and high complex critical decision making was performed.   Kirtland Bouchard, MD

## 2020-09-16 ENCOUNTER — Other Ambulatory Visit: Payer: Self-pay

## 2020-09-16 ENCOUNTER — Encounter: Payer: Self-pay | Admitting: Internal Medicine

## 2020-09-16 ENCOUNTER — Ambulatory Visit (INDEPENDENT_AMBULATORY_CARE_PROVIDER_SITE_OTHER): Payer: PPO | Admitting: Internal Medicine

## 2020-09-16 VITALS — BP 132/76 | HR 70 | Temp 97.0°F | Resp 16 | Ht 63.0 in | Wt 145.0 lb

## 2020-09-16 DIAGNOSIS — R7303 Prediabetes: Secondary | ICD-10-CM

## 2020-09-16 DIAGNOSIS — Z8249 Family history of ischemic heart disease and other diseases of the circulatory system: Secondary | ICD-10-CM

## 2020-09-16 DIAGNOSIS — E559 Vitamin D deficiency, unspecified: Secondary | ICD-10-CM | POA: Diagnosis not present

## 2020-09-16 DIAGNOSIS — R7309 Other abnormal glucose: Secondary | ICD-10-CM

## 2020-09-16 DIAGNOSIS — Z136 Encounter for screening for cardiovascular disorders: Secondary | ICD-10-CM | POA: Diagnosis not present

## 2020-09-16 DIAGNOSIS — K219 Gastro-esophageal reflux disease without esophagitis: Secondary | ICD-10-CM | POA: Diagnosis not present

## 2020-09-16 DIAGNOSIS — Z0000 Encounter for general adult medical examination without abnormal findings: Secondary | ICD-10-CM | POA: Diagnosis not present

## 2020-09-16 DIAGNOSIS — Z79899 Other long term (current) drug therapy: Secondary | ICD-10-CM | POA: Diagnosis not present

## 2020-09-16 DIAGNOSIS — E782 Mixed hyperlipidemia: Secondary | ICD-10-CM

## 2020-09-16 DIAGNOSIS — Z1211 Encounter for screening for malignant neoplasm of colon: Secondary | ICD-10-CM

## 2020-09-16 DIAGNOSIS — E039 Hypothyroidism, unspecified: Secondary | ICD-10-CM

## 2020-09-16 DIAGNOSIS — R0989 Other specified symptoms and signs involving the circulatory and respiratory systems: Secondary | ICD-10-CM | POA: Diagnosis not present

## 2020-09-16 DIAGNOSIS — Z0001 Encounter for general adult medical examination with abnormal findings: Secondary | ICD-10-CM

## 2020-09-16 DIAGNOSIS — I10 Essential (primary) hypertension: Secondary | ICD-10-CM

## 2020-09-16 DIAGNOSIS — Z87891 Personal history of nicotine dependence: Secondary | ICD-10-CM | POA: Diagnosis not present

## 2020-09-16 DIAGNOSIS — Z1212 Encounter for screening for malignant neoplasm of rectum: Secondary | ICD-10-CM

## 2020-09-16 MED ORDER — PANTOPRAZOLE SODIUM 40 MG PO TBEC: DELAYED_RELEASE_TABLET | ORAL | 0 refills | Status: DC

## 2020-09-16 MED ORDER — FAMOTIDINE 40 MG PO TABS: ORAL_TABLET | ORAL | 0 refills | Status: DC

## 2020-09-17 ENCOUNTER — Other Ambulatory Visit: Payer: Self-pay | Admitting: Internal Medicine

## 2020-09-17 DIAGNOSIS — Z124 Encounter for screening for malignant neoplasm of cervix: Secondary | ICD-10-CM | POA: Diagnosis not present

## 2020-09-17 DIAGNOSIS — K219 Gastro-esophageal reflux disease without esophagitis: Secondary | ICD-10-CM

## 2020-09-17 DIAGNOSIS — E039 Hypothyroidism, unspecified: Secondary | ICD-10-CM

## 2020-09-17 LAB — LIPID PANEL
Cholesterol: 160 mg/dL (ref <200)
HDL: 69 mg/dL (ref >=50)
LDL Cholesterol (Calc): 70 mg/dL (calc)
Non-HDL Cholesterol (Calc): 91 mg/dL (calc) (ref <130)
Total CHOL/HDL Ratio: 2.3 (calc) (ref <5.0)
Triglycerides: 124 mg/dL (ref <150)

## 2020-09-17 LAB — CBC WITH DIFFERENTIAL/PLATELET
Absolute Monocytes: 230 cells/uL (ref 200–950)
Basophils Absolute: 30 cells/uL (ref 0–200)
Basophils Relative: 0.6 %
Eosinophils Absolute: 30 cells/uL (ref 15–500)
Eosinophils Relative: 0.6 %
HCT: 38.6 % (ref 35.0–45.0)
Hemoglobin: 13.3 g/dL (ref 11.7–15.5)
Lymphs Abs: 1610 cells/uL (ref 850–3900)
MCH: 31.7 pg (ref 27.0–33.0)
MCHC: 34.5 g/dL (ref 32.0–36.0)
MCV: 91.9 fL (ref 80.0–100.0)
MPV: 9.3 fL (ref 7.5–12.5)
Monocytes Relative: 4.6 %
Neutro Abs: 3100 cells/uL (ref 1500–7800)
Neutrophils Relative %: 62 %
Platelets: 317 10*3/uL (ref 140–400)
RBC: 4.2 10*6/uL (ref 3.80–5.10)
RDW: 12.1 % (ref 11.0–15.0)
Total Lymphocyte: 32.2 %
WBC: 5 10*3/uL (ref 3.8–10.8)

## 2020-09-17 LAB — COMPLETE METABOLIC PANEL WITH GFR
AG Ratio: 2 (calc) (ref 1.0–2.5)
ALT: 20 U/L (ref 6–29)
AST: 23 U/L (ref 10–35)
Albumin: 4.8 g/dL (ref 3.6–5.1)
Alkaline phosphatase (APISO): 39 U/L (ref 37–153)
BUN: 13 mg/dL (ref 7–25)
CO2: 29 mmol/L (ref 20–32)
Calcium: 10.2 mg/dL (ref 8.6–10.4)
Chloride: 104 mmol/L (ref 98–110)
Creat: 0.76 mg/dL (ref 0.60–0.93)
GFR, Est African American: 92 mL/min/{1.73_m2} (ref >=60)
GFR, Est Non African American: 79 mL/min/{1.73_m2} (ref >=60)
Globulin: 2.4 g/dL (calc) (ref 1.9–3.7)
Glucose, Bld: 99 mg/dL (ref 65–99)
Potassium: 4.1 mmol/L (ref 3.5–5.3)
Sodium: 142 mmol/L (ref 135–146)
Total Bilirubin: 0.5 mg/dL (ref 0.2–1.2)
Total Protein: 7.2 g/dL (ref 6.1–8.1)

## 2020-09-17 LAB — URINALYSIS, ROUTINE W REFLEX MICROSCOPIC
Bacteria, UA: NONE SEEN /HPF
Bilirubin Urine: NEGATIVE
Glucose, UA: NEGATIVE
Hyaline Cast: NONE SEEN /LPF
Ketones, ur: NEGATIVE
Leukocytes,Ua: NEGATIVE
Nitrite: NEGATIVE
Protein, ur: NEGATIVE
RBC / HPF: NONE SEEN /HPF (ref 0–2)
Specific Gravity, Urine: 1.008 (ref 1.001–1.03)
Squamous Epithelial / HPF: NONE SEEN /HPF (ref <=5)
WBC, UA: NONE SEEN /HPF (ref 0–5)
pH: 7.5 (ref 5.0–8.0)

## 2020-09-17 LAB — VITAMIN D 25 HYDROXY (VIT D DEFICIENCY, FRACTURES): Vit D, 25-Hydroxy: 62 ng/mL (ref 30–100)

## 2020-09-17 LAB — TSH: TSH: 1.87 mIU/L (ref 0.40–4.50)

## 2020-09-17 LAB — HEMOGLOBIN A1C
Hgb A1c MFr Bld: 5.2 % of total Hgb (ref <5.7)
Mean Plasma Glucose: 103 (calc)
eAG (mmol/L): 5.7 (calc)

## 2020-09-17 LAB — INSULIN, RANDOM: Insulin: 5.8 u[IU]/mL

## 2020-09-17 LAB — MICROALBUMIN / CREATININE URINE RATIO
Creatinine, Urine: 52 mg/dL (ref 20–275)
Microalb Creat Ratio: 10 mcg/mg creat (ref <30)
Microalb, Ur: 0.5 mg/dL

## 2020-09-17 LAB — MAGNESIUM: Magnesium: 2 mg/dL (ref 1.5–2.5)

## 2020-09-17 MED ORDER — PANTOPRAZOLE SODIUM 40 MG PO TBEC: DELAYED_RELEASE_TABLET | ORAL | 0 refills | Status: DC

## 2020-09-17 MED ORDER — FAMOTIDINE 40 MG PO TABS: ORAL_TABLET | ORAL | 0 refills | Status: DC

## 2020-09-17 NOTE — Progress Notes (Signed)
========================================================== ==========================================================  -    Total Chol = 160  -  Excellent   - Very low risk for Heart Attack  / Stroke =============================================================  - A1c -Normal - Great - No Diabetes ==========================================================  -  Vitamin D = 62 - Great  ==========================================================  -  All Else - CBC - Kidneys - Electrolytes - Liver - Magnesium & Thyroid    - all  Normal / OK ==========================================================

## 2020-09-17 NOTE — Progress Notes (Signed)
PATIENT IS AWARE OF LAB RESULTS. Sarah Hartman [facility]

## 2020-11-05 ENCOUNTER — Other Ambulatory Visit: Payer: Self-pay | Admitting: *Deleted

## 2020-11-05 DIAGNOSIS — E782 Mixed hyperlipidemia: Secondary | ICD-10-CM

## 2020-11-05 MED ORDER — ROSUVASTATIN CALCIUM 20 MG PO TABS: ORAL_TABLET | ORAL | 0 refills | Status: DC

## 2020-11-07 DIAGNOSIS — D2321 Other benign neoplasm of skin of right ear and external auricular canal: Secondary | ICD-10-CM | POA: Diagnosis not present

## 2020-11-14 DIAGNOSIS — H2513 Age-related nuclear cataract, bilateral: Secondary | ICD-10-CM | POA: Diagnosis not present

## 2020-11-14 DIAGNOSIS — H353131 Nonexudative age-related macular degeneration, bilateral, early dry stage: Secondary | ICD-10-CM | POA: Diagnosis not present

## 2020-11-14 DIAGNOSIS — H524 Presbyopia: Secondary | ICD-10-CM | POA: Diagnosis not present

## 2020-12-18 DIAGNOSIS — H608X3 Other otitis externa, bilateral: Secondary | ICD-10-CM | POA: Diagnosis not present

## 2020-12-24 NOTE — Progress Notes (Signed)
MEDICARE ANNUAL WELLNESS VISIT AND FOLLOW UP  Assessment:   Diagnoses and all orders for this visit:  Encounter for Medicare annual wellness exam Due annually   Gastroesophageal reflux disease, esophagitis presence not specified Well managed on current medications; avoid triggers, try slow taper of PPI with famotidine if remains well controlled Discussed diet, avoiding triggers and other lifestyle changes -     Magnesium  Irritable bowel syndrome, unspecified type Avoid trigger patient reports asymptomatic this year  Hypothyroidism, unspecified type continue medications the same reminded to take on an empty stomach 30-80mins before food.  -     TSH  Macular degeneration of left eye, unspecified type Managed by current employer at [facility] [employer]  Medication management -     CBC with Differential/Platelet -     CMP/GFR -     Magnesium   Mixed hyperlipidemia Newly on rosuvastatin 20 mg every other day  Continue to recommend low fat/cholesterol diet, exercise, weight management -     Lipid panel  RLS (restless legs syndrome) Not currently on any prescriptions medications, doing well reportedly using an OTC topical cream that seems to help  Vitamin D deficiency Near goal at last visit; continue supplement to maintain between 60-100  Other abnormal glucose Continue to recommend diet low in processed carbohydrates, exercise, weight management Check A1C annually -     CMP/GFRWITH GFR  Former smoker (28 pack year history, quit 1982) Denies concerning sx of lung cancer; doesn't meet criteria for low dose lung cancer screening Had normal CXR 03/27/2020 Consider PRN inhaler to start with bronchitis sx during winter  Labile hypertension Initially elevated, improved with recheck Discussed BP goal of <130/80 She will plan to restart checking more regularly at home, call if persistently above goal  Per strong patient preference will defer on med addition at this time    Over  40 minutes of exam, counseling, chart review and critical decision making was performed Future Appointments  Date Time Provider [place]  04/03/2021 10:30 AM Unk Pinto, MD [facility] None  09/30/2021  3:00 PM Unk Pinto, MD [facility] None  12/25/2021 11:00 AM Liane Comber, NP [facility] None     Plan:   During the course of the visit the patient was educated and counseled about appropriate screening and preventive services including:    Pneumococcal vaccine   Prevnar 13  Influenza vaccine  Td vaccine  Screening electrocardiogram  Bone densitometry screening  Colorectal cancer screening  Diabetes screening  Glaucoma screening  Nutrition counseling   Advanced directives: requested   Subjective:  Sarah Hartman is a 70 y.o. female who presents for Medicare Annual Wellness Visit and 3 month follow up.   She is followed by Dr. 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 replentix She reports symptoms improved recently.   She has dx of RLS, reports has OTC topical that continues to work well for her.  None in recent months.   BMI is Body mass index is 25.86 kg/m., she has been working on diet and exercise. She run/walks 60 min most days. 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 Wt Readings from Last 3 Encounters:  12/25/20 146 lb (66.2 kg)  09/16/20 145 lb (65.8 kg)  03/19/20 143 lb 6.4 oz (65 kg)   she has a diagnosis of GERD which is currently managed by pantoprazole 40 mg daily, famotidine 40 mg, was having breakthrough, now doing  better.  she reports symptoms is currently well controlled, and denies breakthrough reflux, burning in chest, hoarseness or cough.    She is a former smoker, 28 pack year quit in 1982. Denies symptoms other than annual bronchitis ~2 weeks around Christmas. Didn't have last year during the pandemic, wonders  if mask vs social distancing helped.   She does have BP cuff at home, admits hasn't been checking recently.  Today their BP is BP: 132/88 She does workout. She denies chest pain, shortness of breath, dizziness.   She is on cholesterol medication (newly on rosuvastatin 20 mg every other day) and denies myalgias. Her cholesterol is not at goal. The cholesterol last visit was:   Lab Results  Component Value Date   CHOL 160 09/16/2020   HDL 69 09/16/2020   LDLCALC 70 09/16/2020   TRIG 124 09/16/2020   CHOLHDL 2.3 09/16/2020    Last A1C in the office was:  Lab Results  Component Value Date   HGBA1C 5.2 09/16/2020   Last GFR: Lab Results  Component Value Date   GFRNONAA 79 09/16/2020   Patient is on Vitamin D supplement and near goal at last check  Lab Results  Component Value Date   VD25OH 62 09/16/2020     She is on thyroid medication. Her medication was not changed last visit. Takes 50 mcg daily.   Lab Results  Component Value Date   TSH 1.87 09/16/2020  .   Medication Review: Current Outpatient Medications on File Prior to Visit  Medication Sig Dispense Refill  . Cholecalciferol (VITAMIN D3) 5000 units TABS Take 5,000 Units by mouth daily.    . famotidine (PEPCID) 40 MG tablet Take      1 tablet        at Bedtime         for Heart burn & Acid Reflux 90 tablet 0  . levothyroxine (SYNTHROID) 50 MCG tablet Take      1 tablet      Daily       on an empty stomach with only water for 30 minutes & no Antacid meds, Calcium or Magnesium for 4 hours & avoid Biotin 90 tablet 3  . pantoprazole (PROTONIX) 40 MG tablet Take        1 tablet        at Lunchtime       for Indigestion & Acid Reflux 90 tablet 0  . rosuvastatin (CRESTOR) 20 MG tablet Takes     1 tablet     every other day for Cholesterol 46 tablet 0  . beta carotene w/minerals (OCUVITE) tablet Take 1 tablet by mouth daily. (Patient not taking: Reported on 12/25/2020)     No current facility-administered medications on file  prior to visit.    Allergies  Allergen Reactions  . Codeine Nausea And Vomiting and Other (See Comments)    Migraine    Current Problems (verified) Patient Active Problem List   Diagnosis Date Noted  . Former smoker (28 pack year history, quit 1982) 03/19/2020  . Osteopenia 12/28/2017  . Mixed hyperlipidemia 02/07/2015  . Other abnormal glucose 02/07/2015  . Hypothyroidism 02/07/2015  . Medication management 02/07/2015  . Vitamin D deficiency 11/15/2013  . GERD (gastroesophageal reflux disease)   . Macular degeneration of left eye   . IBS (irritable bowel syndrome)   . RLS (restless legs syndrome)   . Labile hypertension     Screening Tests Immunization History  Administered Date(s) Administered  . Influenza,  High Dose Seasonal PF 08/01/2018, 07/07/2019  . Influenza-Unspecified 07/29/2016, 09/01/2020  . Moderna Sars-Covid-2 Vaccination 11/29/2019, 12/27/2019, 09/01/2020  . Pneumococcal Conjugate-13 08/19/2015  . Pneumococcal Polysaccharide-23 11/09/2012, 08/01/2018  . Td 12/27/2018  . Tdap 10/18/2008   Preventative care: Last colonoscopy: 2018 q 5 year due next year Last mammogram: 03/2020 Last pap smear/pelvic exam: PAP 09/2020 by Dr. Nori Riis  DEXA: 04/2016 -  by Dr. Nori Riis  - osteopenia - will ask GYN about follow up   TD or Tdap: 12/2018 Influenza: 2021  Pneumococcal: 2014, 2019 Prevnar13: 2016 Shingles/Zostavax: reports had at pharmacy  Covid 19: 3/3, 2021, moderna   Names of Other Physician/Practitioners you currently use: 1. [facility] here for primary care 2. [facility], eye doctor, last visit 2022 3. [facility] [place], Pharmacist, community, last visit 2021  Patient Care Team: Unk Pinto, MD as PCP - General (Internal Medicine) Ninetta Lights, MD (Inactive) as Consulting Physician (Orthopedic Surgery) Irene Shipper, MD as Consulting Physician (Gastroenterology) Festus Aloe, MD as Consulting Physician  (Urology) Maisie Fus, MD as Consulting Physician (Obstetrics and Gynecology)  SURGICAL HISTORY She  has a past surgical history that includes Colonoscopy; Tubal ligation; Tendon reconstruction (Right, 04/27/2013); and Polypectomy. FAMILY HISTORY Her family history includes Alzheimer's disease in her mother; Cerebral aneurysm in her father; Hypertension in her father; Stomach cancer (age of onset: 33) in her paternal grandmother. SOCIAL HISTORY She  reports that she quit smoking about 39 years ago. She started smoking about 54 years ago. She has a 28.00 pack-year smoking history. She has never used smokeless tobacco. She reports current alcohol use of about 3.0 standard drinks of alcohol per week. She reports that she does not use drugs.   MEDICARE WELLNESS OBJECTIVES: Physical activity: Current Exercise Habits: Home exercise routine, Type of exercise: Other - see comments (runs daily), Time (Minutes): 40, Frequency (Times/Week): 7, Weekly Exercise (Minutes/Week): 280, Intensity: Mild, Exercise limited by: None identified Cardiac risk factors: Cardiac Risk Factors include: advanced age (>39men, >7 women);dyslipidemia;hypertension;smoking/ tobacco exposure Depression/mood screen:   Depression screen [facility] 2/9 12/25/2020  Decreased Interest 0  Down, Depressed, Hopeless 0  PHQ - 2 Score 0    ADLs:  In your present state of health, do you have any difficulty performing the following activities: 12/25/2020 09/15/2020  Hearing? N N  Vision? N N  Difficulty concentrating or making decisions? N N  Walking or climbing stairs? N N  Dressing or bathing? N N  Doing errands, shopping? N N  Some recent data might be hidden     Cognitive Testing  Alert? Yes  Normal Appearance?Yes  Oriented to person? Yes  Place? Yes   Time? Yes  Recall of three objects?  Yes  Can perform simple calculations? Yes  Displays appropriate judgment?Yes  Can read the correct time from a watch face?Yes  EOL planning:  Does Patient Have a Medical Advance Directive?: No Would patient like information on creating a medical advance directive?: No - Patient declined  Review of Systems  Constitutional: Negative for malaise/fatigue and weight loss.  HENT: Negative for hearing loss and tinnitus.   Eyes: Negative for blurred vision and double vision.  Respiratory: Negative for cough, shortness of breath and wheezing.   Cardiovascular: Negative for chest pain, palpitations, orthopnea, claudication and leg swelling.  Gastrointestinal: Negative for abdominal pain, blood in stool, constipation, diarrhea, heartburn, melena, nausea and vomiting.  Genitourinary: Negative for dysuria, flank pain, frequency, hematuria and urgency.  Musculoskeletal: Negative for  falls, joint pain and myalgias.  Skin: Negative for rash.  Neurological: Negative for dizziness, tingling, sensory change, weakness and headaches.  Endo/Heme/Allergies: Negative for polydipsia.  Psychiatric/Behavioral: Negative.  Negative for depression, memory loss and substance abuse. The patient is not nervous/anxious and does not have insomnia.   All other systems reviewed and are negative.    Objective:     Today's Vitals   12/25/20 1112  BP: 132/88  Pulse: 76  Temp: (!) 97.3 F (36.3 C)  SpO2: 96%  Weight: 146 lb (66.2 kg)  Height: 5\' 3"  (1.6 m)   Body mass index is 25.86 kg/m.  General appearance: alert, no distress, WD/WN, female HEENT: normocephalic, sclerae anicteric, TMs pearly, nares patent, no discharge or erythema, pharynx normal Oral cavity: MMM, no lesions Neck: supple, no lymphadenopathy, no thyromegaly, no masses Heart: RRR, normal S1, S2, no murmurs Lungs: CTA bilaterally, no wheezes, rhonchi, or rales Abdomen: +bs, soft, non tender, non distended, no masses, no hepatomegaly, no splenomegaly Musculoskeletal: nontender, no swelling, no obvious deformity Extremities: no edema, no cyanosis, no clubbing Pulses: 2+ symmetric, upper  and lower extremities, normal cap refill Neurological: alert, oriented x 3, CN2-12 intact, strength normal upper extremities and lower extremities, sensation normal throughout, DTRs 2+ throughout, no cerebellar signs, gait normal Psychiatric: normal affect, behavior normal, pleasant   Medicare Attestation I have personally reviewed: The patient's medical and social history Their use of alcohol, tobacco or illicit drugs Their current medications and supplements The patient's functional ability including ADLs,fall risks, home safety risks, cognitive, and hearing and visual impairment Diet and physical activities Evidence for depression or mood disorders  The patient's weight, height, BMI, and visual acuity have been recorded in the chart.  I have made referrals, counseling, and provided education to the patient based on review of the above and I have provided the patient with a written personalized care plan for preventive services.     Izora Ribas, NP   12/25/2020

## 2020-12-25 ENCOUNTER — Ambulatory Visit (INDEPENDENT_AMBULATORY_CARE_PROVIDER_SITE_OTHER): Payer: PPO | Admitting: Adult Health

## 2020-12-25 ENCOUNTER — Other Ambulatory Visit: Payer: Self-pay

## 2020-12-25 ENCOUNTER — Encounter: Payer: Self-pay | Admitting: Adult Health

## 2020-12-25 VITALS — BP 132/88 | HR 76 | Temp 97.3°F | Ht 63.0 in | Wt 146.0 lb

## 2020-12-25 DIAGNOSIS — Z0001 Encounter for general adult medical examination with abnormal findings: Secondary | ICD-10-CM | POA: Diagnosis not present

## 2020-12-25 DIAGNOSIS — E039 Hypothyroidism, unspecified: Secondary | ICD-10-CM

## 2020-12-25 DIAGNOSIS — Z0000 Encounter for general adult medical examination without abnormal findings: Secondary | ICD-10-CM

## 2020-12-25 DIAGNOSIS — R0989 Other specified symptoms and signs involving the circulatory and respiratory systems: Secondary | ICD-10-CM

## 2020-12-25 DIAGNOSIS — E782 Mixed hyperlipidemia: Secondary | ICD-10-CM

## 2020-12-25 DIAGNOSIS — E559 Vitamin D deficiency, unspecified: Secondary | ICD-10-CM | POA: Diagnosis not present

## 2020-12-25 DIAGNOSIS — Z87891 Personal history of nicotine dependence: Secondary | ICD-10-CM | POA: Diagnosis not present

## 2020-12-25 DIAGNOSIS — R7309 Other abnormal glucose: Secondary | ICD-10-CM | POA: Diagnosis not present

## 2020-12-25 DIAGNOSIS — M8589 Other specified disorders of bone density and structure, multiple sites: Secondary | ICD-10-CM | POA: Diagnosis not present

## 2020-12-25 DIAGNOSIS — R0600 Dyspnea, unspecified: Secondary | ICD-10-CM

## 2020-12-25 DIAGNOSIS — Z6825 Body mass index (BMI) 25.0-25.9, adult: Secondary | ICD-10-CM

## 2020-12-25 DIAGNOSIS — K589 Irritable bowel syndrome without diarrhea: Secondary | ICD-10-CM

## 2020-12-25 DIAGNOSIS — R0609 Other forms of dyspnea: Secondary | ICD-10-CM

## 2020-12-25 DIAGNOSIS — R6889 Other general symptoms and signs: Secondary | ICD-10-CM | POA: Diagnosis not present

## 2020-12-25 DIAGNOSIS — G2581 Restless legs syndrome: Secondary | ICD-10-CM

## 2020-12-25 DIAGNOSIS — H3530 Unspecified macular degeneration: Secondary | ICD-10-CM | POA: Diagnosis not present

## 2020-12-25 DIAGNOSIS — Z23 Encounter for immunization: Secondary | ICD-10-CM

## 2020-12-25 DIAGNOSIS — Z79899 Other long term (current) drug therapy: Secondary | ICD-10-CM | POA: Diagnosis not present

## 2020-12-25 DIAGNOSIS — K219 Gastro-esophageal reflux disease without esophagitis: Secondary | ICD-10-CM | POA: Diagnosis not present

## 2020-12-25 MED ORDER — FAMOTIDINE 40 MG PO TABS: ORAL_TABLET | ORAL | 0 refills | Status: DC

## 2020-12-25 MED ORDER — PANTOPRAZOLE SODIUM 40 MG PO TBEC: DELAYED_RELEASE_TABLET | ORAL | 0 refills | Status: DC

## 2020-12-25 NOTE — Patient Instructions (Addendum)
Sarah Hartman , Thank you for taking time to come for your Medicare Wellness Visit. I appreciate your ongoing commitment to your health goals. Please review the following plan we discussed and let me know if I can assist you in the future.   These are the goals we discussed: Goals    . Blood Pressure < 130/80    . LDL CALC < 100       This is a list of the screening recommended for you and due dates:  Health Maintenance  Topic Date Due  . COVID-19 Vaccine (4 - Booster for Moderna series) 03/01/2021  . Mammogram  03/27/2021  . Colon Cancer Screening  09/21/2022  . Tetanus Vaccine  12/27/2028  . Flu Shot  Completed  . DEXA scan (bone density measurement)  Completed  .  Hepatitis C: One time screening is recommended by Center for Disease Control  (CDC) for  adults born from [birth year] through 1966.   Completed  . Pneumonia vaccines  Completed    Please scheudle bone density with Dr. Nori Riis   Please get shingles vaccine date/information       HYPERTENSION INFORMATION  Monitor your blood pressure at home, please keep a record and bring that in with you to your next office visit.   Go to the ER if any CP, SOB, nausea, dizziness, severe HA, changes vision/speech  Testing/Procedures: HOW TO TAKE YOUR BLOOD PRESSURE:  Rest 5 minutes before taking your blood pressure.  Don't smoke or drink caffeinated beverages for at least 30 minutes before.  Take your blood pressure before (not after) you eat.  Sit comfortably with your back supported and both feet on the floor (don't cross your legs).  Elevate your arm to heart level on a table or a desk.  Use the proper sized cuff. It should fit smoothly and snugly around your bare upper arm. There should be enough room to slip a fingertip under the cuff. The bottom edge of the cuff should be 1 inch above the crease of the elbow.  Due to a recent study, SPRINT, we have changed our goal for the systolic or top blood pressure number. Ideally we want  your top number at 120.  In the [facility] Trial, 5000 people were randomized to a goal BP of 120 and 5000 people were randomized to a goal BP of less than 140. The patients with the goal BP at 120 had LESS DEMENTIA, LESS HEART ATTACKS, AND LESS STROKES, AS WELL AS OVERALL DECREASED MORTALITY OR DEATH RATE.   There was another study that showed taking your blood pressure medications at night decrease cardiovascular events.  However if you are on a fluid pill, please take this in the morning.   If you are willing, our goal BP is the top number of 120.  Your most recent BP: BP: 132/88   Take your medications faithfully as instructed. Maintain a healthy weight. Get at least 150 minutes of aerobic exercise per week. Minimize salt intake. Minimize alcohol intake  DASH Eating Plan DASH stands for "Dietary Approaches to Stop Hypertension." The DASH eating plan is a healthy eating plan that has been shown to reduce high blood pressure (hypertension). Additional health benefits may include reducing the risk of type 2 diabetes mellitus, heart disease, and stroke. The DASH eating plan may also help with weight loss. WHAT DO I NEED TO KNOW ABOUT THE DASH EATING PLAN? For the DASH eating plan, you will follow these general guidelines:  Choose foods with  a percent daily value for sodium of less than 5% (as listed on the food label).  Use salt-free seasonings or herbs instead of table salt or sea salt.  Check with your health care provider or pharmacist before using salt substitutes.  Eat lower-sodium products, often labeled as "lower sodium" or "no salt added."  Eat fresh foods.  Eat more vegetables, fruits, and low-fat dairy products.  Choose whole grains. Look for the word "whole" as the first word in the ingredient list.  Choose fish and skinless chicken or [place] more often than red meat. Limit fish, poultry, and meat to 6 oz (170 g) each day.  Limit sweets, desserts, sugars, and sugary  drinks.  Choose heart-healthy fats.  Limit cheese to 1 oz (28 g) per day.  Eat more home-cooked food and less restaurant, buffet, and fast food.  Limit fried foods.  Cook foods using methods other than frying.  Limit canned vegetables. If you do use them, rinse them well to decrease the sodium.  When eating at a restaurant, ask that your food be prepared with less salt, or no salt if possible. WHAT FOODS CAN I EAT? Seek help from a dietitian for individual calorie needs. Grains Whole grain or whole wheat bread. Brown rice. Whole grain or whole wheat pasta. Quinoa, bulgur, and whole grain cereals. Low-sodium cereals. Corn or whole wheat flour tortillas. Whole grain cornbread. Whole grain crackers. Low-sodium crackers. Vegetables Fresh or frozen vegetables (raw, steamed, roasted, or grilled). Low-sodium or reduced-sodium tomato and vegetable juices. Low-sodium or reduced-sodium tomato sauce and paste. Low-sodium or reduced-sodium canned vegetables.  Fruits All fresh, canned (in natural juice), or frozen fruits. Meat and Other Protein Products Ground beef (85% or leaner), grass-fed beef, or beef trimmed of fat. Skinless chicken or [place]. Ground chicken or [place]. Pork trimmed of fat. All fish and seafood. Eggs. Dried beans, peas, or lentils. Unsalted nuts and seeds. Unsalted canned beans. Dairy Low-fat dairy products, such as skim or 1% milk, 2% or reduced-fat cheeses, low-fat ricotta or cottage cheese, or plain low-fat yogurt. Low-sodium or reduced-sodium cheeses. Fats and Oils Tub margarines without trans fats. Light or reduced-fat mayonnaise and salad dressings (reduced sodium). Avocado. Safflower, olive, or canola oils. Natural peanut or almond butter. Other Unsalted popcorn and pretzels. The items listed above may not be a complete list of recommended foods or beverages. Contact your dietitian for more options. WHAT FOODS ARE NOT RECOMMENDED? Grains White bread. White pasta.  White rice. Refined cornbread. Bagels and croissants. Crackers that contain trans fat. Vegetables Creamed or fried vegetables. Vegetables in a cheese sauce. Regular canned vegetables. Regular canned tomato sauce and paste. Regular tomato and vegetable juices. Fruits Dried fruits. Canned fruit in light or heavy syrup. Fruit juice. Meat and Other Protein Products Fatty cuts of meat. Ribs, chicken wings, bacon, sausage, bologna, salami, chitterlings, fatback, hot dogs, bratwurst, and packaged luncheon meats. Salted nuts and seeds. Canned beans with salt. Dairy Whole or 2% milk, cream, half-and-half, and cream cheese. Whole-fat or sweetened yogurt. Full-fat cheeses or blue cheese. Nondairy creamers and whipped toppings. Processed cheese, cheese spreads, or cheese curds. Condiments Onion and garlic salt, seasoned salt, table salt, and sea salt. Canned and packaged gravies. Worcestershire sauce. Tartar sauce. Barbecue sauce. Teriyaki sauce. Soy sauce, including reduced sodium. Steak sauce. Fish sauce. Oyster sauce. Cocktail sauce. Horseradish. Ketchup and mustard. Meat flavorings and tenderizers. Bouillon cubes. Hot sauce. Tabasco sauce. Marinades. Taco seasonings. Relishes. Fats and Oils Butter, stick margarine, lard, shortening, ghee, and bacon  fat. Coconut, palm kernel, or palm oils. Regular salad dressings. Other Pickles and olives. Salted popcorn and pretzels. The items listed above may not be a complete list of foods and beverages to avoid. Contact your dietitian for more information. WHERE CAN I FIND MORE INFORMATION? National Heart, Lung, and Blood Institute: travelstabloid.com Document Released: 10/08/2011 Document Revised: 03/05/2014 Document Reviewed: 08/23/2013 [facility] Patient Information 2015 [place], [place]. This information is not intended to replace advice given to you by your health care provider. Make sure you discuss any questions you have with your  health care provider.

## 2020-12-26 LAB — CBC WITH DIFFERENTIAL/PLATELET
Absolute Monocytes: 297 cells/uL (ref 200–950)
Basophils Absolute: 39 cells/uL (ref 0–200)
Basophils Relative: 0.7 %
Eosinophils Absolute: 28 cells/uL (ref 15–500)
Eosinophils Relative: 0.5 %
HCT: 38.1 % (ref 35.0–45.0)
Hemoglobin: 12.9 g/dL (ref 11.7–15.5)
Lymphs Abs: 2178 cells/uL (ref 850–3900)
MCH: 30.7 pg (ref 27.0–33.0)
MCHC: 33.9 g/dL (ref 32.0–36.0)
MCV: 90.7 fL (ref 80.0–100.0)
MPV: 9.3 fL (ref 7.5–12.5)
Monocytes Relative: 5.3 %
Neutro Abs: 3058 cells/uL (ref 1500–7800)
Neutrophils Relative %: 54.6 %
Platelets: 317 10*3/uL (ref 140–400)
RBC: 4.2 10*6/uL (ref 3.80–5.10)
RDW: 12.6 % (ref 11.0–15.0)
Total Lymphocyte: 38.9 %
WBC: 5.6 10*3/uL (ref 3.8–10.8)

## 2020-12-26 LAB — COMPLETE METABOLIC PANEL WITH GFR
AG Ratio: 1.9 (calc) (ref 1.0–2.5)
ALT: 15 U/L (ref 6–29)
AST: 18 U/L (ref 10–35)
Albumin: 4.7 g/dL (ref 3.6–5.1)
Alkaline phosphatase (APISO): 32 U/L — ABNORMAL LOW (ref 37–153)
BUN: 19 mg/dL (ref 7–25)
CO2: 29 mmol/L (ref 20–32)
Calcium: 10.4 mg/dL (ref 8.6–10.4)
Chloride: 103 mmol/L (ref 98–110)
Creat: 0.84 mg/dL (ref 0.60–0.93)
GFR, Est African American: 82 mL/min/{1.73_m2} (ref >=60)
GFR, Est Non African American: 70 mL/min/{1.73_m2} (ref >=60)
Globulin: 2.5 g/dL (calc) (ref 1.9–3.7)
Glucose, Bld: 88 mg/dL (ref 65–99)
Potassium: 4.4 mmol/L (ref 3.5–5.3)
Sodium: 141 mmol/L (ref 135–146)
Total Bilirubin: 0.4 mg/dL (ref 0.2–1.2)
Total Protein: 7.2 g/dL (ref 6.1–8.1)

## 2020-12-26 LAB — LIPID PANEL
Cholesterol: 158 mg/dL (ref <200)
HDL: 71 mg/dL (ref >=50)
LDL Cholesterol (Calc): 67 mg/dL (calc)
Non-HDL Cholesterol (Calc): 87 mg/dL (calc) (ref <130)
Total CHOL/HDL Ratio: 2.2 (calc) (ref <5.0)
Triglycerides: 123 mg/dL (ref <150)

## 2020-12-26 LAB — TSH: TSH: 1.56 mIU/L (ref 0.40–4.50)

## 2020-12-26 LAB — MAGNESIUM: Magnesium: 2 mg/dL (ref 1.5–2.5)

## 2021-01-10 DIAGNOSIS — H353131 Nonexudative age-related macular degeneration, bilateral, early dry stage: Secondary | ICD-10-CM | POA: Diagnosis not present

## 2021-01-10 DIAGNOSIS — H2513 Age-related nuclear cataract, bilateral: Secondary | ICD-10-CM | POA: Diagnosis not present

## 2021-01-10 DIAGNOSIS — H43812 Vitreous degeneration, left eye: Secondary | ICD-10-CM | POA: Diagnosis not present

## 2021-01-20 ENCOUNTER — Other Ambulatory Visit: Payer: Self-pay | Admitting: Internal Medicine

## 2021-01-20 DIAGNOSIS — E782 Mixed hyperlipidemia: Secondary | ICD-10-CM

## 2021-02-20 DIAGNOSIS — H353131 Nonexudative age-related macular degeneration, bilateral, early dry stage: Secondary | ICD-10-CM | POA: Diagnosis not present

## 2021-02-20 DIAGNOSIS — H43812 Vitreous degeneration, left eye: Secondary | ICD-10-CM | POA: Diagnosis not present

## 2021-02-20 DIAGNOSIS — H2513 Age-related nuclear cataract, bilateral: Secondary | ICD-10-CM | POA: Diagnosis not present

## 2021-02-25 DIAGNOSIS — L821 Other seborrheic keratosis: Secondary | ICD-10-CM | POA: Diagnosis not present

## 2021-03-26 ENCOUNTER — Ambulatory Visit: Payer: PPO | Admitting: Adult Health

## 2021-04-02 DIAGNOSIS — Z1231 Encounter for screening mammogram for malignant neoplasm of breast: Secondary | ICD-10-CM | POA: Diagnosis not present

## 2021-04-02 LAB — HM MAMMOGRAPHY

## 2021-04-02 NOTE — Progress Notes (Signed)
Future Appointments  Date Time Provider [place]  04/03/2021 10:30 AM Unk Pinto, MD [facility] None  09/30/2021 - CPE   3:00 PM Unk Pinto, MD [facility] None  12/25/2021 -  Wellness 11:00 AM Liane Comber, NP [facility] None    History of Present Illness:      This very nice 70 y.o.  DWF presents for 6  month follow up with HTN, HLD, Pre-Diabetes, Hypothyroidism and Vitamin D Deficiency.  Patient has GERD controlled with diet & her meds.       Patient ihas been followed  for labile HTN since 2007  & BP has been controlled at home. Today's BP is elevated at 160/80. Patient has had no complaints of any cardiac type chest pain, palpitations, dyspnea / orthopnea / PND, dizziness, claudication, or dependent edema.  BP Readings from Last 3 Encounters:  04/03/21 160/80  12/25/20 132/88  09/16/20 132/76         Hyperlipidemia is controlled with diet & Rosuvastatin. Patient denies myalgias or other med SE's. Last Lipids were at goal:  Lab Results  Component Value Date   CHOL 158 12/25/2020   HDL 71 12/25/2020   LDLCALC 67 12/25/2020   TRIG 123 12/25/2020   CHOLHDL 2.2 12/25/2020     Also, the patient has history of PreDiabetes (A1c 5.7% /2012) and has had no symptoms of reactive hypoglycemia, diabetic polys, paresthesias or visual blurring.  Last A1c was  Normal & at goal:  Lab Results  Component Value Date   HGBA1C 5.2 09/16/2020           Patient was dx'd hypothyroid in 2001 & initiated on replacement therapy.        Further, the patient also has history of Vitamin D Deficiency ("46" ontx /2017) and supplements vitamin D without any suspected side-effects. Last vitamin D was at goal:  Lab Results  Component Value Date   VD25OH 62 09/16/2020     Current Outpatient Medications on File Prior to Visit  Medication Sig  . VITAMIN D  5000 units  Take  daily.  . Famotidine 40 MG tablet Take 1 tablet at Bedtime f  . levothyroxine  50 MCG tablet Take       1 tablet      Daily    . RA VISION-VITE PRESERVE  Take by mouth in the morning and bedtime.  . pantoprazole  40 MG tablet Take 1 tablet at Lunchtime   . Rosuvastatin  20 MG tablet TAKE 1 TABLET EVERY OTHER DAY  . OCUVITE (Patient not taking: Reported on 04/03/2021)    Allergies  Allergen Reactions  . Codeine Nausea And Vomiting and Other (See Comments)    Migraine    PMHx:   Past Medical History:  Diagnosis Date  . Adenomatous colon polyp   . Allergy   . GERD (gastroesophageal reflux disease)   . Heart murmur    history of as teenager   . IBS (irritable bowel syndrome)   . Labile hypertension   . Macular degeneration of left eye   . RLS (restless legs syndrome)   . Thyroid disease    hypothyroid  . Vitamin D deficiency      Immunization History  Administered Date(s) Administered  . Influenza, High Dose Seasonal PF 08/01/2018, 07/07/2019  . Influenza-Unspecified 07/29/2016, 09/01/2020  . Moderna Sars-Covid-2 Vaccination 11/29/2019, 12/27/2019, 09/01/2020  . Pneumococcal Conjugate-13 08/19/2015  . Pneumococcal Polysaccharide-23 11/09/2012, 08/01/2018  . Td 12/27/2018  . Tdap 10/18/2008    Past  Surgical History:  Procedure Laterality Date  . COLONOSCOPY    . POLYPECTOMY    . TENDON RECONSTRUCTION Right 04/27/2013   Procedure: RIGHT ELBOW DEBRIDE/REPAIR/EXPLORE - LATERAL HUMERAL EPICONDYLE;  Surgeon: Ninetta Lights, MD;  Location: [facility];  Service: Orthopedics;  Laterality: Right;  . TUBAL LIGATION      FHx:    Reviewed / unchanged  SHx:    Reviewed / unchanged   Systems Review:  Constitutional: Denies fever, chills, wt changes, headaches, insomnia, fatigue, night sweats, change in appetite. Eyes: Denies redness, blurred vision, diplopia, discharge, itchy, watery eyes.  ENT: Denies discharge, congestion, post nasal drip, epistaxis, sore throat, earache, hearing loss, dental pain, tinnitus, vertigo, sinus pain, snoring.  CV: Denies chest  pain, palpitations, irregular heartbeat, syncope, dyspnea, diaphoresis, orthopnea, PND, claudication or edema. Respiratory: denies cough, dyspnea, DOE, pleurisy, hoarseness, laryngitis, wheezing.  Gastrointestinal: Denies dysphagia, odynophagia, heartburn, reflux, water brash, abdominal pain or cramps, nausea, vomiting, bloating, diarrhea, constipation, hematemesis, melena, hematochezia  or hemorrhoids. Genitourinary: Denies dysuria, frequency, urgency, nocturia, hesitancy, discharge, hematuria or flank pain. Musculoskeletal: Denies arthralgias, myalgias, stiffness, jt. swelling, pain, limping or strain/sprain.  Skin: Denies pruritus, rash, hives, warts, acne, eczema or change in skin lesion(s). Neuro: No weakness, tremor, incoordination, spasms, paresthesia or pain. Psychiatric: Denies confusion, memory loss or sensory loss. Endo: Denies change in weight, skin or hair change.  Heme/Lymph: No excessive bleeding, bruising or enlarged lymph nodes.  Physical Exam  BP (!) 160/80 (BP Location: Right Arm, Patient Position: Sitting, Cuff Size: Normal)   Pulse 72   Temp (!) 97.5 F (36.4 C)   Resp 17   Ht 5\' 3"  (1.6 m)   Wt 143 lb 12.8 oz (65.2 kg)   SpO2 99%   BMI 25.47 kg/m   Appears  well nourished, well groomed  and in no distress.  Eyes: PERRLA, EOMs, conjunctiva no swelling or erythema. Sinuses: No frontal/maxillary tenderness ENT/Mouth: EAC's clear, TM's nl w/o erythema, bulging. Nares clear w/o erythema, swelling, exudates. Oropharynx clear without erythema or exudates. Oral hygiene is good. Tongue normal, non obstructing. Hearing intact.  Neck: Supple. Thyroid not palpable. Car 2+/2+ without bruits, nodes or JVD. Chest: Respirations nl with BS clear & equal w/o rales, rhonchi, wheezing or stridor.  Cor: Heart sounds normal w/ regular rate and rhythm without sig. murmurs, gallops, clicks or rubs. Peripheral pulses normal and equal  without edema.  Abdomen: Soft & bowel sounds normal.  Non-tender w/o guarding, rebound, hernias, masses or organomegaly.  Lymphatics: Unremarkable.  Musculoskeletal: Full ROM all peripheral extremities, joint stability, 5/5 strength and normal gait.  Skin: Warm, dry without exposed rashes, lesions or ecchymosis apparent.  Neuro: Cranial nerves intact, reflexes equal bilaterally. Sensory-motor testing grossly intact. Tendon reflexes grossly intact.  Pysch: Alert & oriented x 3.  Insight and judgement nl & appropriate. No ideations.  Assessment and Plan:  1. Labile hypertension  - Continue medication, monitor blood pressure at home.  - Continue DASH diet.  Reminder to go to the ER if any CP,  SOB, nausea, dizziness, severe HA, changes vision/speech.  - CBC with Differential/Platelet - COMPLETE METABOLIC PANEL WITH GFR - Magnesium - TSH  2. Hyperlipidemia, mixed  - Continue diet/meds, exercise,& lifestyle modifications.  - Continue monitor periodic cholesterol/liver & renal functions   - Lipid panel - TSH  3. Abnormal glucose  - Continue diet, exercise  - Lifestyle modifications.  - Monitor appropriate labs  - Hemoglobin A1c - Insulin, random  4. Vitamin D  deficiency  - Continue supplementation.  - VITAMIN D 25 Hydroxy  5. Hypothyroidism  - TSH  6. Gastroesophageal reflux disease  - CBC with Differential/Platelet  7. Medication management  - CBC with Differential/Platelet - COMPLETE METABOLIC PANEL WITH GFR - Magnesium - Lipid panel - TSH - Hemoglobin A1c - Insulin, random - VITAMIN D 25 Hydroxy        Discussed  regular exercise, BP monitoring, weight control to achieve/maintain BMI less than 25 and discussed med and SE's. Recommended labs to assess and monitor clinical status with further disposition pending results of labs.  I discussed the assessment and treatment plan with the patient. The patient was provided an opportunity to ask questions and all were answered. The patient agreed with the plan and  demonstrated an understanding of the instructions.  I provided over 30 minutes of exam, counseling, chart review and  complex critical decision making.        The patient was advised to call back or seek an in-person evaluation if the symptoms worsen or if the condition fails to improve as anticipated.   Kirtland Bouchard, MD]

## 2021-04-02 NOTE — Patient Instructions (Signed)

## 2021-04-03 ENCOUNTER — Encounter: Payer: Self-pay | Admitting: Internal Medicine

## 2021-04-03 ENCOUNTER — Ambulatory Visit (INDEPENDENT_AMBULATORY_CARE_PROVIDER_SITE_OTHER): Payer: PPO | Admitting: Internal Medicine

## 2021-04-03 ENCOUNTER — Other Ambulatory Visit: Payer: Self-pay

## 2021-04-03 VITALS — BP 160/80 | HR 72 | Temp 97.5°F | Resp 17 | Ht 63.0 in | Wt 143.8 lb

## 2021-04-03 DIAGNOSIS — R0989 Other specified symptoms and signs involving the circulatory and respiratory systems: Secondary | ICD-10-CM

## 2021-04-03 DIAGNOSIS — K219 Gastro-esophageal reflux disease without esophagitis: Secondary | ICD-10-CM | POA: Diagnosis not present

## 2021-04-03 DIAGNOSIS — E782 Mixed hyperlipidemia: Secondary | ICD-10-CM

## 2021-04-03 DIAGNOSIS — E039 Hypothyroidism, unspecified: Secondary | ICD-10-CM | POA: Diagnosis not present

## 2021-04-03 DIAGNOSIS — E559 Vitamin D deficiency, unspecified: Secondary | ICD-10-CM

## 2021-04-03 DIAGNOSIS — R7309 Other abnormal glucose: Secondary | ICD-10-CM | POA: Diagnosis not present

## 2021-04-03 DIAGNOSIS — Z79899 Other long term (current) drug therapy: Secondary | ICD-10-CM

## 2021-04-04 LAB — CBC WITH DIFFERENTIAL/PLATELET
Absolute Monocytes: 360 cells/uL (ref 200–950)
Basophils Absolute: 48 cells/uL (ref 0–200)
Basophils Relative: 0.8 %
Eosinophils Absolute: 30 cells/uL (ref 15–500)
Eosinophils Relative: 0.5 %
HCT: 38.1 % (ref 35.0–45.0)
Hemoglobin: 12.7 g/dL (ref 11.7–15.5)
Lymphs Abs: 1866 cells/uL (ref 850–3900)
MCH: 30.5 pg (ref 27.0–33.0)
MCHC: 33.3 g/dL (ref 32.0–36.0)
MCV: 91.6 fL (ref 80.0–100.0)
MPV: 9.5 fL (ref 7.5–12.5)
Monocytes Relative: 6 %
Neutro Abs: 3696 cells/uL (ref 1500–7800)
Neutrophils Relative %: 61.6 %
Platelets: 300 10*3/uL (ref 140–400)
RBC: 4.16 10*6/uL (ref 3.80–5.10)
RDW: 12.4 % (ref 11.0–15.0)
Total Lymphocyte: 31.1 %
WBC: 6 10*3/uL (ref 3.8–10.8)

## 2021-04-04 LAB — COMPLETE METABOLIC PANEL WITH GFR
AG Ratio: 2.2 (calc) (ref 1.0–2.5)
ALT: 14 U/L (ref 6–29)
AST: 20 U/L (ref 10–35)
Albumin: 4.8 g/dL (ref 3.6–5.1)
Alkaline phosphatase (APISO): 31 U/L — ABNORMAL LOW (ref 37–153)
BUN: 14 mg/dL (ref 7–25)
CO2: 27 mmol/L (ref 20–32)
Calcium: 9.9 mg/dL (ref 8.6–10.4)
Chloride: 106 mmol/L (ref 98–110)
Creat: 0.86 mg/dL (ref 0.60–0.93)
GFR, Est African American: 79 mL/min/{1.73_m2} (ref >=60)
GFR, Est Non African American: 68 mL/min/{1.73_m2} (ref >=60)
Globulin: 2.2 g/dL (calc) (ref 1.9–3.7)
Glucose, Bld: 90 mg/dL (ref 65–99)
Potassium: 4 mmol/L (ref 3.5–5.3)
Sodium: 142 mmol/L (ref 135–146)
Total Bilirubin: 0.5 mg/dL (ref 0.2–1.2)
Total Protein: 7 g/dL (ref 6.1–8.1)

## 2021-04-04 LAB — INSULIN, RANDOM: Insulin: 4.9 u[IU]/mL

## 2021-04-04 LAB — HEMOGLOBIN A1C
Hgb A1c MFr Bld: 5.3 % of total Hgb (ref <5.7)
Mean Plasma Glucose: 105 mg/dL
eAG (mmol/L): 5.8 mmol/L

## 2021-04-04 LAB — LIPID PANEL
Cholesterol: 137 mg/dL (ref <200)
HDL: 61 mg/dL (ref >=50)
LDL Cholesterol (Calc): 53 mg/dL (calc)
Non-HDL Cholesterol (Calc): 76 mg/dL (calc) (ref <130)
Total CHOL/HDL Ratio: 2.2 (calc) (ref <5.0)
Triglycerides: 146 mg/dL (ref <150)

## 2021-04-04 LAB — MAGNESIUM: Magnesium: 2 mg/dL (ref 1.5–2.5)

## 2021-04-04 LAB — TSH: TSH: 1.53 mIU/L (ref 0.40–4.50)

## 2021-04-04 LAB — VITAMIN D 25 HYDROXY (VIT D DEFICIENCY, FRACTURES): Vit D, 25-Hydroxy: 72 ng/mL (ref 30–100)

## 2021-04-07 ENCOUNTER — Other Ambulatory Visit: Payer: Self-pay | Admitting: Adult Health

## 2021-04-07 DIAGNOSIS — K219 Gastro-esophageal reflux disease without esophagitis: Secondary | ICD-10-CM

## 2021-04-15 ENCOUNTER — Encounter: Payer: Self-pay | Admitting: Internal Medicine

## 2021-04-17 ENCOUNTER — Telehealth: Payer: Self-pay

## 2021-04-17 NOTE — Telephone Encounter (Signed)
Pt was called to discuss her lab results. Pt states she understands and will call or send a message in my chart if any question.

## 2021-04-29 DIAGNOSIS — R309 Painful micturition, unspecified: Secondary | ICD-10-CM | POA: Diagnosis not present

## 2021-04-29 DIAGNOSIS — R300 Dysuria: Secondary | ICD-10-CM | POA: Diagnosis not present

## 2021-05-22 DIAGNOSIS — N390 Urinary tract infection, site not specified: Secondary | ICD-10-CM | POA: Diagnosis not present

## 2021-05-22 DIAGNOSIS — N958 Other specified menopausal and perimenopausal disorders: Secondary | ICD-10-CM | POA: Diagnosis not present

## 2021-05-22 DIAGNOSIS — M8588 Other specified disorders of bone density and structure, other site: Secondary | ICD-10-CM | POA: Diagnosis not present

## 2021-05-22 LAB — HM DEXA SCAN

## 2021-07-25 ENCOUNTER — Other Ambulatory Visit: Payer: Self-pay | Admitting: Internal Medicine

## 2021-07-25 MED ORDER — ACYCLOVIR 400 MG PO TABS: ORAL_TABLET | ORAL | 3 refills | Status: DC

## 2021-08-11 ENCOUNTER — Other Ambulatory Visit: Payer: Self-pay | Admitting: Internal Medicine

## 2021-08-11 DIAGNOSIS — E039 Hypothyroidism, unspecified: Secondary | ICD-10-CM

## 2021-09-29 ENCOUNTER — Encounter: Payer: Self-pay | Admitting: Internal Medicine

## 2021-09-29 NOTE — Patient Instructions (Signed)
Due to recent changes in healthcare laws, you Dudash see the results of your imaging and laboratory studies on MyChart before your provider has had a chance to review them.  We understand that in some cases there Gin be results that are confusing or concerning to you. Not all laboratory results come back in the same time frame and the provider Hoback be waiting for multiple results in order to interpret others.  Please give [place] 48 hours in order for your provider to thoroughly review all the results before contacting the office for clarification of your results.   +++++++++++++++++++++++++  Vit D  & Vit C 1,000 mg   are recommended to help protect  against the Covid-19 and other Corona viruses.    Also it's recommended  to take  Zinc 50 mg  to help  protect against the Covid-19   and best place to get  is also on [email or web address]  and don't pay more than 6-8 cents /pill !  ================================ Coronavirus (COVID-19) Are you at risk?  Are you at risk for the Coronavirus (COVID-19)?  To be considered HIGH RISK for Coronavirus (COVID-19), you have to meet the following criteria:  Traveled to [place], [place], [place], [place] or [place]; or in the [place] to [place], [place], [place]  or [place]; and have fever, cough, and shortness of breath within the last 2 weeks of travel OR Been in close contact with a person diagnosed with COVID-19 within the last 2 weeks and have  fever, cough,and shortness of breath  IF YOU DO NOT MEET THESE CRITERIA, YOU ARE CONSIDERED LOW RISK FOR COVID-19.  What to do if you are HIGH RISK for COVID-19?  If you are having a medical emergency, call 911. Seek medical care right away. Before you go to a doctor's office, urgent care or emergency department,  call ahead and tell them about your recent travel, contact with someone diagnosed with COVID-19   and your symptoms.  You should receive instructions from your physician's office regarding next  steps of care.  When you arrive at healthcare provider, tell the healthcare staff immediately you have returned from  visiting [place], [place], [place], [place] or [place]; or traveled in the [place] to [place], [place],  [place] or [place] in the last two weeks or you have been in close contact with a person diagnosed with  COVID-19 in the last 2 weeks.   Tell the health care staff about your symptoms: fever, cough and shortness of breath. After you have been seen by a medical provider, you will be either: Tested for (COVID-19) and discharged home on quarantine except to seek medical care if  symptoms worsen, and asked to  Stay home and avoid contact with others until you get your results (4-5 days)  Avoid travel on public transportation if possible (such as bus, train, or airplane) or Sent to the Emergency Department by EMS for evaluation, COVID-19 testing  and  possible admission depending on your condition and test results.  What to do if you are LOW RISK for COVID-19?  Reduce your risk of any infection by using the same precautions used for avoiding the common cold or flu:  Wash your hands often with soap and warm water for at least 20 seconds.  If soap and water are not readily available,  use an alcohol-based hand sanitizer with at least 60% alcohol.  If coughing or sneezing, cover your mouth and nose by coughing  or sneezing into the elbow areas of your shirt or coat,  into a tissue or into your sleeve (not your hands). Avoid shaking hands with others and consider head nods or verbal greetings only. Avoid touching your eyes, nose, or mouth with unwashed hands.  Avoid close contact with people who are sick. Avoid places or events with large numbers of people in one location, like concerts or sporting events. Carefully consider travel plans you have or are making. If you are planning any travel outside or inside the [place], visit the CDC's Travelers' Health webpage for the  latest health notices. If you have some symptoms but not all symptoms, continue to monitor at home and seek medical attention  if your symptoms worsen. If you are having a medical emergency, call 911.   >>>>>>>>>>>>>>>>>>>>>>>>>>>>>>>>>  We Do NOT Approve of [employer] > > > > > > > > > > > > > > > > > > > > > > > > > > > > > > > > > > > > > > >  Preventive Care for Adults  A healthy lifestyle and preventive care can promote health and wellness. Preventive health guidelines for women include the following key practices. A routine yearly physical is a good way to check with your health care provider about your health and preventive screening. It is a chance to share any concerns and updates on your health and to receive a thorough exam. Visit your dentist for a routine exam and preventive care every 6 months. Brush your teeth twice a day and floss once a day. Good oral hygiene prevents tooth decay and gum disease. The frequency of eye exams is based on your age, health, family medical history, use of contact lenses, and other factors. Follow your health care provider's recommendations for frequency of eye exams. Eat a healthy diet. Foods like vegetables, fruits, whole grains, low-fat dairy products, and lean protein foods contain the nutrients you need without too many calories. Decrease your intake of foods high in solid fats, added sugars, and salt. Eat the right amount of calories for you. Get information about a proper diet from your health care provider, if necessary. Regular physical exercise is one of the most important things you can do for your health. Most adults should get at least 150 minutes of moderate-intensity exercise (any activity that increases your heart rate and causes you to sweat) each week. In addition, most adults need muscle-strengthening exercises on 2 or more days a week. Maintain a healthy weight. The body mass index (BMI) is a screening tool to identify  possible weight problems. It provides an estimate of body fat based on height and weight. Your health care provider can find your BMI and can help you achieve or maintain a healthy weight. For adults 20 years and older: A BMI below 18.5 is considered underweight. A BMI of 18.5 to 24.9 is normal. A BMI of 25 to 29.9 is considered overweight. A BMI of 30 and above is considered obese. Maintain normal blood lipids and cholesterol levels by exercising and minimizing your intake of saturated fat. Eat a balanced diet with plenty of fruit and vegetables. If your lipid or cholesterol levels are high, you are over 50, or you are at high risk for heart disease, you may need your cholesterol levels checked more frequently. Ongoing high lipid and cholesterol levels should be treated with medicines if diet and exercise are not working. If you smoke, find out from  your health care provider how to quit. If you do not use tobacco, do not start. Lung cancer screening is recommended for adults aged 55-80 years who are at high risk for developing lung cancer because of a history of smoking. A yearly low-dose CT scan of the lungs is recommended for people who have at least a 30-pack-year history of smoking and are a current smoker or have quit within the past 15 years. A pack year of smoking is smoking an average of 1 pack of cigarettes a day for 1 year (for example: 1 pack a day for 30 years or 2 packs a day for 15 years). Yearly screening should continue until the smoker has stopped smoking for at least 15 years. Yearly screening should be stopped for people who develop a health problem that would prevent them from having lung cancer treatment. Avoid use of street drugs. Do not share needles with anyone. Ask for help if you need support or instructions about stopping the use of drugs. High blood pressure causes heart disease and increases the risk of stroke.  Ongoing high blood pressure should be treated with medicines if  weight loss and exercise do not work. If you are 55-79 years old, ask your health care provider if you should take aspirin to prevent strokes. Diabetes screening involves taking a blood sample to check your fasting blood sugar level. This should be done once every 3 years, after age 45, if you are within normal weight and without risk factors for diabetes. Testing should be considered at a younger age or be carried out more frequently if you are overweight and have at least 1 risk factor for diabetes. Breast cancer screening is essential preventive care for women. You should practice "breast self-awareness." This means understanding the normal appearance and feel of your breasts and may include breast self-examination. Any changes detected, no matter how small, should be reported to a health care provider. Women in their 20s and 30s should have a clinical breast exam (CBE) by a health care provider as part of a regular health exam every 1 to 3 years. After age 40, women should have a CBE every year. Starting at age 40, women should consider having a mammogram (breast X-ray test) every year. Women who have a family history of breast cancer should talk to their health care provider about genetic screening. Women at a high risk of breast cancer should talk to their health care providers about having an MRI and a mammogram every year. Breast cancer gene (BRCA)-related cancer risk assessment is recommended for women who have family members with BRCA-related cancers. BRCA-related cancers include breast, ovarian, tubal, and peritoneal cancers. Having family members with these cancers may be associated with an increased risk for harmful changes (mutations) in the breast cancer genes BRCA1 and BRCA2. Results of the assessment will determine the need for genetic counseling and BRCA1 and BRCA2 testing. Routine pelvic exams to screen for cancer are no longer recommended for nonpregnant women who are considered low risk for  cancer of the pelvic organs (ovaries, uterus, and vagina) and who do not have symptoms. Ask your health care provider if a screening pelvic exam is right for you. If you have had past treatment for cervical cancer or a condition that could lead to cancer, you need Pap tests and screening for cancer for at least 20 years after your treatment. If Pap tests have been discontinued, your risk factors (such as having a new sexual partner) need to be   reassessed to determine if screening should be resumed. Some women have medical problems that increase the chance of getting cervical cancer. In these cases, your health care provider may recommend more frequent screening and Pap tests.  Colorectal cancer can be detected and often prevented. Most routine colorectal cancer screening begins at the age of 9 years and continues through age 47 years. However, your health care provider may recommend screening at an earlier age if you have risk factors for colon cancer. On a yearly basis, your health care provider may provide home test kits to check for hidden blood in the stool. Use of a small camera at the end of a tube, to directly examine the colon (sigmoidoscopy or colonoscopy), can detect the earliest forms of colorectal cancer. Talk to your health care provider about this at age 66, when routine screening begins.  Direct exam of the colon should be repeated every 5-10 years through age 98 years, unless early forms of pre-cancerous polyps or small growths are found. Osteoporosis is a disease in which the bones lose minerals and strength with aging. This can result in serious bone fractures or breaks. The risk of osteoporosis can be identified using a bone density scan. Women ages 48 years and over and women at risk for fractures or osteoporosis should discuss screening with their health care providers. Ask your health care provider whether you should take a calcium supplement or vitamin D to reduce the rate of  osteoporosis. Menopause can be associated with physical symptoms and risks. Hormone replacement therapy is available to decrease symptoms and risks. You should talk to your health care provider about whether hormone replacement therapy is right for you. Use sunscreen. Apply sunscreen liberally and repeatedly throughout the day. You should seek shade when your shadow is shorter than you. Protect yourself by wearing long sleeves, pants, a wide-brimmed hat, and sunglasses year round, whenever you are outdoors. Once a month, do a whole body skin exam, using a mirror to look at the skin on your back. Tell your health care provider of new moles, moles that have irregular borders, moles that are larger than a pencil eraser, or moles that have changed in shape or color. Stay current with required vaccines (immunizations). Influenza vaccine. All adults should be immunized every year. Tetanus, diphtheria, and acellular pertussis (Td, Tdap) vaccine. Pregnant women should receive 1 dose of Tdap vaccine during each pregnancy. The dose should be obtained regardless of the length of time since the last dose. Immunization is preferred during the 27th-36th week of gestation. An adult who has not previously received Tdap or who does not know her vaccine status should receive 1 dose of Tdap. This initial dose should be followed by tetanus and diphtheria toxoids (Td) booster doses every 10 years. Adults with an unknown or incomplete history of completing a 3-dose immunization series with Td-containing vaccines should begin or complete a primary immunization series including a Tdap dose. Adults should receive a Td booster every 10 years.  Zoster vaccine. One dose is recommended for adults aged 47 years or older unless certain conditions are present.  Pneumococcal 13-valent conjugate (PCV13) vaccine. When indicated, a person who is uncertain of her immunization history and has no record of immunization should receive the PCV13  vaccine. An adult aged 63 years or older who has certain medical conditions and has not been previously immunized should receive 1 dose of PCV13 vaccine. This PCV13 should be followed with a dose of pneumococcal polysaccharide (PPSV23) vaccine. The PPSV23  vaccine dose should be obtained at least 1 or more year(s) after the dose of PCV13 vaccine. An adult aged 19 years or older who has certain medical conditions and previously received 1 or more doses of PPSV23 vaccine should receive 1 dose of PCV13. The PCV13 vaccine dose should be obtained 1 or more years after the last PPSV23 vaccine dose.  Pneumococcal polysaccharide (PPSV23) vaccine. When PCV13 is also indicated, PCV13 should be obtained first. All adults aged 65 years and older should be immunized. An adult younger than age 65 years who has certain medical conditions should be immunized. Any person who resides in a nursing home or long-term care facility should be immunized. An adult smoker should be immunized. People with an immunocompromised condition and certain other conditions should receive both PCV13 and PPSV23 vaccines. People with human immunodeficiency virus (HIV) infection should be immunized as soon as possible after diagnosis. Immunization during chemotherapy or radiation therapy should be avoided. Routine use of PPSV23 vaccine is not recommended for American Indians, [place] Natives, or people younger than 65 years unless there are medical conditions that require PPSV23 vaccine. When indicated, people who have unknown immunization and have no record of immunization should receive PPSV23 vaccine. One-time revaccination 5 years after the first dose of PPSV23 is recommended for people aged 19-64 years who have chronic kidney failure, nephrotic syndrome, asplenia, or immunocompromised conditions. People who received 1-2 doses of PPSV23 before age 65 years should receive another dose of PPSV23 vaccine at age 65 years or later if at least 5 years have  passed since the previous dose. Doses of PPSV23 are not needed for people immunized with PPSV23 at or after age 65 years.  Preventive Services / Frequency  Ages 65 years and over Blood pressure check. Lipid and cholesterol check. Lung cancer screening. / Every year if you are aged 55-80 years and have a 30-pack-year history of smoking and currently smoke or have quit within the past 15 years. Yearly screening is stopped once you have quit smoking for at least 15 years or develop a health problem that would prevent you from having lung cancer treatment. Clinical breast exam.** / Every year after age 40 years.  BRCA-related cancer risk assessment.** / For women who have family members with a BRCA-related cancer (breast, ovarian, tubal, or peritoneal cancers). Mammogram.** / Every year beginning at age 40 years and continuing for as long as you are in good health. Consult with your health care provider. Pap test.** / Every 3 years starting at age 30 years through age 65 or 70 years with 3 consecutive normal Pap tests. Testing can be stopped between 65 and 70 years with 3 consecutive normal Pap tests and no abnormal Pap or HPV tests in the past 10 years. Fecal occult blood test (FOBT) of stool. / Every year beginning at age 50 years and continuing until age 75 years. You may not need to do this test if you get a colonoscopy every 10 years. Flexible sigmoidoscopy or colonoscopy.** / Every 5 years for a flexible sigmoidoscopy or every 10 years for a colonoscopy beginning at age 50 years and continuing until age 75 years. Hepatitis C blood test.** / For all people born from 1945 through 1965 and any individual with known risks for hepatitis C. Osteoporosis screening.** / A one-time screening for women ages 65 years and over and women at risk for fractures or osteoporosis. Skin self-exam. / Monthly. Influenza vaccine. / Every year. Tetanus, diphtheria, and acellular pertussis (Tdap/Td) vaccine.** /   1 dose  of Td every 10 years. Zoster vaccine.** / 1 dose for adults aged 60 years or older. Pneumococcal 13-valent conjugate (PCV13) vaccine.** / Consult your health care provider. Pneumococcal polysaccharide (PPSV23) vaccine.** / 1 dose for all adults aged 65 years and older. Screening for abdominal aortic aneurysm (AAA)  by ultrasound is recommended for people who have history of high blood pressure or who are current or former smokers. ++++++++++++++++++++ Recommend Adult Low Dose Aspirin or  coated  Aspirin 81 mg daily  To reduce risk of Colon Cancer 40 %,  Skin Cancer 26 % ,  Melanoma 46%  and  Pancreatic cancer 60% ++++++++++++++++++++ Vitamin D goal  is between 70-100.  Please make sure that you are taking your Vitamin D as directed.  It is very important as a natural anti-inflammatory  helping hair, skin, and nails, as well as reducing stroke and heart attack risk.  It helps your bones and helps with mood. It also decreases numerous cancer risks so please take it as directed.  Low Vit D is associated with a 200-300% higher risk for CANCER  and 200-300% higher risk for HEART   ATTACK  &  STROKE.   ...................................... It is also associated with higher death rate at younger ages,  autoimmune diseases like Rheumatoid arthritis, Lupus, Multiple Sclerosis.    Also many other serious conditions, like depression, Alzheimer's Dementia, infertility, muscle aches, fatigue, fibromyalgia - just to name a few. ++++++++++++++++++ Recommend the book "The END of DIETING" by Dr Joel Fuhrman  & the book "The END of DIABETES " by Dr Joel Fuhrman At [employer] - get book & Audio CD's    Being diabetic has a  300% increased risk for heart attack, stroke, cancer, and alzheimer- type vascular dementia. It is very important that you work harder with diet by avoiding all foods that are white. Avoid white rice (brown & wild rice is OK), white potatoes (sweetpotatoes in moderation is OK),  White bread or wheat bread or anything made out of white flour like bagels, donuts, rolls, buns, biscuits, cakes, pastries, cookies, pizza crust, and pasta (made from white flour & egg whites) - vegetarian pasta or spinach or wheat pasta is OK. Multigrain breads like Arnold's or [employer], or multigrain sandwich thins or flatbreads.  Diet, exercise and weight loss can reverse and cure diabetes in the early stages.  Diet, exercise and weight loss is very important in the control and prevention of complications of diabetes which affects every system in your body, ie. Brain - dementia/stroke, eyes - glaucoma/blindness, heart - heart attack/heart failure, kidneys - dialysis, stomach - gastric paralysis, intestines - malabsorption, nerves - severe painful neuritis, circulation - gangrene & loss of a leg(s), and finally cancer and Alzheimers.    I recommend avoid fried & greasy foods,  sweets/candy, white rice (brown or wild rice or Quinoa is OK), white potatoes (sweet potatoes are OK) - anything made from white flour - bagels, doughnuts, rolls, buns, biscuits,white and wheat breads, pizza crust and traditional pasta made of white flour & egg white(vegetarian pasta or spinach or wheat pasta is OK).  Multi-grain bread is OK - like multi-grain flat bread or sandwich thins. Avoid alcohol in excess. Exercise is also important.    Eat all the vegetables you want - avoid meat, especially red meat and dairy - especially cheese.  Cheese is the most concentrated form of trans-fats which is the worst thing to clog up our arteries. Veggie cheese is OK   which can be found in the fresh produce section at [employer] or [employer] or [employer]  +++++++++++++++++++ DASH Eating Plan  DASH stands for "Dietary Approaches to Stop Hypertension."   The DASH eating plan is a healthy eating plan that has been shown to reduce high blood pressure (hypertension). Additional health benefits may include reducing the risk of type 2  diabetes mellitus, heart disease, and stroke. The DASH eating plan may also help with weight loss. WHAT DO I NEED TO KNOW ABOUT THE DASH EATING PLAN? For the DASH eating plan, you will follow these general guidelines: Choose foods with a percent daily value for sodium of less than 5% (as listed on the food label). Use salt-free seasonings or herbs instead of table salt or sea salt. Check with your health care provider or pharmacist before using salt substitutes. Eat lower-sodium products, often labeled as "lower sodium" or "no salt added." Eat fresh foods. Eat more vegetables, fruits, and low-fat dairy products. Choose whole grains. Look for the word "whole" as the first word in the ingredient list. Choose fish  Limit sweets, desserts, sugars, and sugary drinks. Choose heart-healthy fats. Eat veggie cheese  Eat more home-cooked food and less restaurant, buffet, and fast food. Limit fried foods. Cook foods using methods other than frying. Limit canned vegetables. If you do use them, rinse them well to decrease the sodium. When eating at a restaurant, ask that your food be prepared with less salt, or no salt if possible.                      WHAT FOODS CAN I EAT? Read Dr Joel Fuhrman's books on The End of Dieting & The End of Diabetes  Grains Whole grain or whole wheat bread. Brown rice. Whole grain or whole wheat pasta. Quinoa, bulgur, and whole grain cereals. Low-sodium cereals. Corn or whole wheat flour tortillas. Whole grain cornbread. Whole grain crackers. Low-sodium crackers.  Vegetables Fresh or frozen vegetables (raw, steamed, roasted, or grilled). Low-sodium or reduced-sodium tomato and vegetable juices. Low-sodium or reduced-sodium tomato sauce and paste. Low-sodium or reduced-sodium canned vegetables.   Fruits All fresh, canned (in natural juice), or frozen fruits.  Protein Products  All fish and seafood.  Dried beans, peas, or lentils. Unsalted nuts and seeds. Unsalted  canned beans.  Dairy Low-fat dairy products, such as skim or 1% milk, 2% or reduced-fat cheeses, low-fat ricotta or cottage cheese, or plain low-fat yogurt. Low-sodium or reduced-sodium cheeses.  Fats and Oils Tub margarines without trans fats. Light or reduced-fat mayonnaise and salad dressings (reduced sodium). Avocado. Safflower, olive, or canola oils. Natural peanut or almond butter.  Other Unsalted popcorn and pretzels. The items listed above may not be a complete list of recommended foods or beverages. Contact your dietitian for more options.  +++++++++++++++  WHAT FOODS ARE NOT RECOMMENDED? Grains/ White flour or wheat flour White bread. White pasta. White rice. Refined cornbread. Bagels and croissants. Crackers that contain trans fat.  Vegetables  Creamed or fried vegetables. Vegetables in a . Regular canned vegetables. Regular canned tomato sauce and paste. Regular tomato and vegetable juices.  Fruits Dried fruits. Canned fruit in light or heavy syrup. Fruit juice.  Meat and Other Protein Products Meat in general - RED meat & White meat.  Fatty cuts of meat. Ribs, chicken wings, all processed meats as bacon, sausage, bologna, salami, fatback, hot dogs, bratwurst and packaged luncheon meats.  Dairy Whole or 2% milk, cream, half-and-half, and cream cheese.   Whole-fat or sweetened yogurt. Full-fat cheeses or blue cheese. Non-dairy creamers and whipped toppings. Processed cheese, cheese spreads, or cheese curds.  Condiments Onion and garlic salt, seasoned salt, table salt, and sea salt. Canned and packaged gravies. Worcestershire sauce. Tartar sauce. Barbecue sauce. Teriyaki sauce. Soy sauce, including reduced sodium. Steak sauce. Fish sauce. Oyster sauce. Cocktail sauce. Horseradish. Ketchup and mustard. Meat flavorings and tenderizers. Bouillon cubes. Hot sauce. Tabasco sauce. Marinades. Taco seasonings. Relishes.  Fats and Oils Butter, stick margarine, lard, shortening and  bacon fat. Coconut, palm kernel, or palm oils. Regular salad dressings.  Pickles and olives. Salted popcorn and pretzels.  The items listed above may not be a complete list of foods and beverages to avoid.   

## 2021-09-29 NOTE — Progress Notes (Signed)
Annual Screening/Preventative Visit & Comprehensive Evaluation &  Examination  Future Appointments  Date Time Provider Department  09/30/2021  3:00 PM Unk Pinto, MD [facility]  12/25/2021           Wellness 11:00 AM Liane Comber, NP [facility]  09/30/2022  3:00 PM Unk Pinto, MD [facility]        This very nice 70 y.o. DWF presents for a Screening /Preventative Visit & comprehensive evaluation and management of multiple medical co-morbidities.  Patient has been followed for HTN, HLD, Hypothyroidism, Prediabetes  and Vitamin D Deficiency.  Patient has GERD  controlled with diet & her meds.         Labile  HTN predates since  2007. Patient's BP has been controlled at home and patient denies any cardiac symptoms as chest pain, palpitations, shortness of breath, dizziness or ankle swelling. Today's BP is at goal - 130/82 .       Patient's hyperlipidemia is controlled with diet and gCrestor.   Patient denies myalgias or other medication SE's. Last lipids were at goal :  Lab Results  Component Value Date   CHOL 137 04/03/2021   HDL 61 04/03/2021   LDLCALC 53 04/03/2021   TRIG 146 04/03/2021   CHOLHDL 2.2 04/03/2021         Patient has hx/o prediabetes (A1c 5.7% /2012) and patient denies reactive hypoglycemic symptoms, visual blurring, diabetic polys or paresthesias. Last A1c was normal & at goal :  Lab Results  Component Value Date   HGBA1C 5.3 04/03/2021         Finally, patient has history of Vitamin D Deficiency (A1c 5.7% /2012) and last Vitamin D was at goal :  Lab Results  Component Value Date   VD25OH 72 04/03/2021     Current Outpatient Medications on File Prior to Visit  Medication Sig   acyclovir 400 MG tablet Take  1 capsule  3 x /day  with Meals  &  2 capsules  at Bedtime    VITAMIN D 5000 units Take  daily.   famotidine  40 MG tablet Take 1 tablet at Bedtime    levothyroxine  50 MCG tablet TAKE 1 TABLET DAILY    RA VISION-VITE PRESERVE  Take   in the morning and at bedtime.   pantoprazole 40 MG tablet Take  1 tablet  Daily to Prevent Indigestion & Heartburn   rosuvastatin  20 MG tablet TAKE 1 TABLET  EVERY OTHER DAY       Allergies  Allergen Reactions   Codeine Nausea And Vomiting and Other    Migraine     Past Medical History:  Diagnosis Date   Adenomatous colon polyp    Allergy    GERD (gastroesophageal reflux disease)    Heart murmur    history of as teenager    IBS (irritable bowel syndrome)    Labile hypertension    Macular degeneration of left eye    RLS (restless legs syndrome)    Thyroid disease    hypothyroid   Vitamin D deficiency      Health Maintenance  Topic Date Due   Zoster Vaccines- Shingrix (1 of 2) Never done   COVID-19 Vaccine (4 - Booster for Moderna series) 10/27/2020   INFLUENZA VACCINE  06/02/2021   MAMMOGRAM  04/02/2022   COLONOSCOPY  09/21/2022   TETANUS/TDAP  12/27/2028   Pneumonia Vaccine 12+ Years old  Completed   DEXA SCAN  Completed   Hepatitis C Screening  Completed  HPV VACCINES  Aged Out     Immunization History  Administered Date(s) Administered   Influenza, High Dose Seasonal PF 08/01/2018, 07/07/2019   Influenza-Unspecified 07/29/2016, 09/01/2020   Moderna Sars-Covid-2 Vaccination 11/29/2019, 12/27/2019, 09/01/2020   Pneumococcal Conjugate-13 08/19/2015   Pneumococcal Polysaccharide-23 11/09/2012, 08/01/2018   Td 12/27/2018   Tdap 10/18/2008     Last Colon - 09/21/2017 - Dr Henrene Pastor recc 5 yr f/u due Nov/Dec 2023   Last MGM - 04/02/2021 - Negative   Past Surgical History:  Procedure Laterality Date   COLONOSCOPY     POLYPECTOMY     TENDON RECONSTRUCTION Right 04/27/2013   Procedure: RIGHT ELBOW DEBRIDE/REPAIR/EXPLORE - LATERAL HUMERAL EPICONDYLE;  Surgeon: Ninetta Lights, MD;  Location: [facility];  Service: Orthopedics;  Laterality: Right;   TUBAL LIGATION       Family History  Problem Relation Age of Onset   Hypertension Father     Cerebral aneurysm Father    Alzheimer's disease Mother    Stomach cancer Paternal Grandmother 47   Colon cancer Neg Hx    Esophageal cancer Neg Hx    Rectal cancer Neg Hx      Social History   Tobacco Use   Smoking status: Former    Packs/day: 2.00    Years: 14.00    Pack years: 28.00    Types: Cigarettes    Start date: 11/02/1966    Quit date: 04/24/1981    Years since quitting: 40.4   Smokeless tobacco: Never  Vaping Use   Vaping Use: Never used  Substance Use Topics   Alcohol use: Yes    Alcohol/week: 3.0 standard drinks    Types: 3 Glasses of wine per week    Comment: occ   Drug use: No      ROS Constitutional: Denies fever, chills, weight loss/gain, headaches, insomnia,  night sweats, and change in appetite. Does c/o fatigue. Eyes: Denies redness, blurred vision, diplopia, discharge, itchy, watery eyes.  ENT: Denies discharge, congestion, post nasal drip, epistaxis, sore throat, earache, hearing loss, dental pain, Tinnitus, Vertigo, Sinus pain, snoring.  Cardio: Denies chest pain, palpitations, irregular heartbeat, syncope, dyspnea, diaphoresis, orthopnea, PND, claudication, edema Respiratory: denies cough, dyspnea, DOE, pleurisy, hoarseness, laryngitis, wheezing.  Gastrointestinal: Denies dysphagia, heartburn, reflux, water brash, pain, cramps, nausea, vomiting, bloating, diarrhea, constipation, hematemesis, melena, hematochezia, jaundice, hemorrhoids Genitourinary: Denies dysuria, frequency, urgency, nocturia, hesitancy, discharge, hematuria, flank pain Breast: Breast lumps, nipple discharge, bleeding.  Musculoskeletal: Denies arthralgia, myalgia, stiffness, Jt. Swelling, pain, limp, and strain/sprain. Denies falls. Skin: Denies puritis, rash, hives, warts, acne, eczema, changing in skin lesion Neuro: No weakness, tremor, incoordination, spasms, paresthesia, pain Psychiatric: Denies confusion, memory loss, sensory loss. Denies Depression. Endocrine: Denies change  in weight, skin, hair change, nocturia, and paresthesia, diabetic polys, visual blurring, hyper / hypo glycemic episodes.  Heme/Lymph: No excessive bleeding, bruising, enlarged lymph nodes.  Physical Exam  BP 130/82   Pulse 70   Temp 97.9 F (36.6 C)   Resp 16   Ht 5\' 3"  (1.6 m)   Wt 141 lb 9.6 oz (64.2 kg)   SpO2 98%   BMI 25.08 kg/m   General Appearance: Well nourished, well groomed and in no apparent distress.  Eyes: PERRLA, EOMs, conjunctiva no swelling or erythema, normal fundi and vessels. Sinuses: No frontal/maxillary tenderness ENT/Mouth: EACs patent / TMs  nl. Nares clear without erythema, swelling, mucoid exudates. Oral hygiene is good. No erythema, swelling, or exudate. Tongue normal, non-obstructing. Tonsils not swollen or erythematous.  Hearing normal.  Neck: Supple, thyroid not palpable. No bruits, nodes or JVD. Respiratory: Respiratory effort normal.  BS equal and clear bilateral without rales, rhonci, wheezing or stridor. Cardio: Heart sounds are normal with regular rate and rhythm and no murmurs, rubs or gallops. Peripheral pulses are normal and equal bilaterally without edema. No aortic or femoral bruits. Chest: symmetric with normal excursions and percussion. Breasts: Symmetric, without lumps, nipple discharge, retractions, or fibrocystic changes.  Abdomen: Flat, soft with bowel sounds active. Nontender, no guarding, rebound, hernias, masses, or organomegaly.  Lymphatics: Non tender without lymphadenopathy.  Genitourinary:  Musculoskeletal: Full ROM all peripheral extremities, joint stability, 5/5 strength, and normal gait. Skin: Warm and dry without rashes, lesions, cyanosis, clubbing or  ecchymosis.  Neuro: Cranial nerves intact, reflexes equal bilaterally. Normal muscle tone, no cerebellar symptoms. Sensation intact.  Pysch: Alert and oriented X 3, normal affect, Insight and Judgment appropriate.    Assessment and Plan  1. Annual Preventative Screening  Examination  2. Labile hypertension  - EKG 12-Lead - Urinalysis, Routine w reflex microscopic - Urine Culture - Microalbumin / creatinine urine ratio - CBC with Differential/Platelet - COMPLETE METABOLIC PANEL WITH GFR - Magnesium - TSH  3. Hyperlipidemia, mixed  - EKG 12-Lead - Lipid panel - TSH  4. Abnormal glucose  - EKG 12-Lead - Hemoglobin A1c - Insulin, random  5. Vitamin D deficiency  - VITAMIN D 25 Hydroxy   6. Hypothyroidism, unspecified type  - TSH  7. Gastroesophageal reflux disease  - CBC with Differential/Platelet  8. Encounter for colorectal cancer screening  - POC Hemoccult Bld/Stl  9. Screening for ischemic heart disease  - EKG 12-Lead  10. FH: hypertension  - EKG 12-Lead  11. Former smoker  - EKG 12-Lead  12. Medication management  - Urinalysis, Routine w reflex microscopic - Urine Culture - CBC with Differential/Platelet - COMPLETE METABOLIC PANEL WITH GFR - Magnesium - Lipid panel - TSH - Hemoglobin A1c - Insulin, random - VITAMIN D 25 Hydroxy           Patient was counseled in prudent diet to achieve/maintain BMI less than 25 for weight control, BP monitoring, regular exercise and medications. Discussed med's effects and SE's. Screening labs and tests as requested with regular follow-up as recommended. Over 40 minutes of exam, counseling, chart review and high complex critical decision making was performed.   Kirtland Bouchard, MD

## 2021-09-30 ENCOUNTER — Ambulatory Visit (INDEPENDENT_AMBULATORY_CARE_PROVIDER_SITE_OTHER): Payer: PPO | Admitting: Internal Medicine

## 2021-09-30 ENCOUNTER — Other Ambulatory Visit: Payer: Self-pay

## 2021-09-30 ENCOUNTER — Encounter: Payer: Self-pay | Admitting: Internal Medicine

## 2021-09-30 VITALS — BP 130/82 | HR 70 | Temp 97.9°F | Resp 16 | Ht 63.0 in | Wt 141.6 lb

## 2021-09-30 DIAGNOSIS — Z8249 Family history of ischemic heart disease and other diseases of the circulatory system: Secondary | ICD-10-CM

## 2021-09-30 DIAGNOSIS — K219 Gastro-esophageal reflux disease without esophagitis: Secondary | ICD-10-CM

## 2021-09-30 DIAGNOSIS — E559 Vitamin D deficiency, unspecified: Secondary | ICD-10-CM | POA: Diagnosis not present

## 2021-09-30 DIAGNOSIS — Z136 Encounter for screening for cardiovascular disorders: Secondary | ICD-10-CM

## 2021-09-30 DIAGNOSIS — Z0000 Encounter for general adult medical examination without abnormal findings: Secondary | ICD-10-CM

## 2021-09-30 DIAGNOSIS — E039 Hypothyroidism, unspecified: Secondary | ICD-10-CM | POA: Diagnosis not present

## 2021-09-30 DIAGNOSIS — Z87891 Personal history of nicotine dependence: Secondary | ICD-10-CM | POA: Diagnosis not present

## 2021-09-30 DIAGNOSIS — Z1211 Encounter for screening for malignant neoplasm of colon: Secondary | ICD-10-CM

## 2021-09-30 DIAGNOSIS — Z0001 Encounter for general adult medical examination with abnormal findings: Secondary | ICD-10-CM

## 2021-09-30 DIAGNOSIS — R7309 Other abnormal glucose: Secondary | ICD-10-CM | POA: Diagnosis not present

## 2021-09-30 DIAGNOSIS — R0989 Other specified symptoms and signs involving the circulatory and respiratory systems: Secondary | ICD-10-CM | POA: Diagnosis not present

## 2021-09-30 DIAGNOSIS — Z79899 Other long term (current) drug therapy: Secondary | ICD-10-CM

## 2021-09-30 DIAGNOSIS — E782 Mixed hyperlipidemia: Secondary | ICD-10-CM

## 2021-10-01 LAB — MICROALBUMIN / CREATININE URINE RATIO
Creatinine, Urine: 110 mg/dL (ref 20–275)
Microalb Creat Ratio: 9 mcg/mg creat (ref <30)
Microalb, Ur: 1 mg/dL

## 2021-10-01 LAB — URINE CULTURE
MICRO NUMBER:: 12690638
SPECIMEN QUALITY:: ADEQUATE

## 2021-10-01 LAB — LIPID PANEL
Cholesterol: 152 mg/dL (ref <200)
HDL: 65 mg/dL (ref >=50)
LDL Cholesterol (Calc): 68 mg/dL (calc)
Non-HDL Cholesterol (Calc): 87 mg/dL (calc) (ref <130)
Total CHOL/HDL Ratio: 2.3 (calc) (ref <5.0)
Triglycerides: 107 mg/dL (ref <150)

## 2021-10-01 LAB — COMPLETE METABOLIC PANEL WITH GFR
AG Ratio: 2.1 (calc) (ref 1.0–2.5)
ALT: 16 U/L (ref 6–29)
AST: 22 U/L (ref 10–35)
Albumin: 4.7 g/dL (ref 3.6–5.1)
Alkaline phosphatase (APISO): 35 U/L — ABNORMAL LOW (ref 37–153)
BUN: 17 mg/dL (ref 7–25)
CO2: 26 mmol/L (ref 20–32)
Calcium: 9.9 mg/dL (ref 8.6–10.4)
Chloride: 106 mmol/L (ref 98–110)
Creat: 0.82 mg/dL (ref 0.60–1.00)
Globulin: 2.2 g/dL (calc) (ref 1.9–3.7)
Glucose, Bld: 86 mg/dL (ref 65–99)
Potassium: 4.3 mmol/L (ref 3.5–5.3)
Sodium: 143 mmol/L (ref 135–146)
Total Bilirubin: 0.5 mg/dL (ref 0.2–1.2)
Total Protein: 6.9 g/dL (ref 6.1–8.1)
eGFR: 76 mL/min/{1.73_m2} (ref >=60)

## 2021-10-01 LAB — URINALYSIS, ROUTINE W REFLEX MICROSCOPIC
Bilirubin Urine: NEGATIVE
Glucose, UA: NEGATIVE
Hgb urine dipstick: NEGATIVE
Ketones, ur: NEGATIVE
Leukocytes,Ua: NEGATIVE
Nitrite: NEGATIVE
Protein, ur: NEGATIVE
Specific Gravity, Urine: 1.019 (ref 1.001–1.035)
pH: 5.5 (ref 5.0–8.0)

## 2021-10-01 LAB — CBC WITH DIFFERENTIAL/PLATELET
Absolute Monocytes: 258 cells/uL (ref 200–950)
Basophils Absolute: 32 cells/uL (ref 0–200)
Basophils Relative: 0.5 %
Eosinophils Absolute: 32 cells/uL (ref 15–500)
Eosinophils Relative: 0.5 %
HCT: 35.1 % (ref 35.0–45.0)
Hemoglobin: 11.8 g/dL (ref 11.7–15.5)
Lymphs Abs: 2129 cells/uL (ref 850–3900)
MCH: 30.7 pg (ref 27.0–33.0)
MCHC: 33.6 g/dL (ref 32.0–36.0)
MCV: 91.4 fL (ref 80.0–100.0)
MPV: 9.6 fL (ref 7.5–12.5)
Monocytes Relative: 4.1 %
Neutro Abs: 3849 cells/uL (ref 1500–7800)
Neutrophils Relative %: 61.1 %
Platelets: 294 10*3/uL (ref 140–400)
RBC: 3.84 10*6/uL (ref 3.80–5.10)
RDW: 12.5 % (ref 11.0–15.0)
Total Lymphocyte: 33.8 %
WBC: 6.3 10*3/uL (ref 3.8–10.8)

## 2021-10-01 LAB — MAGNESIUM: Magnesium: 2 mg/dL (ref 1.5–2.5)

## 2021-10-01 LAB — TSH: TSH: 2.32 mIU/L (ref 0.40–4.50)

## 2021-10-01 LAB — HEMOGLOBIN A1C
Hgb A1c MFr Bld: 5.4 % of total Hgb (ref <5.7)
Mean Plasma Glucose: 108 mg/dL
eAG (mmol/L): 6 mmol/L

## 2021-10-01 LAB — INSULIN, RANDOM: Insulin: 3.6 u[IU]/mL

## 2021-10-01 LAB — VITAMIN D 25 HYDROXY (VIT D DEFICIENCY, FRACTURES): Vit D, 25-Hydroxy: 71 ng/mL (ref 30–100)

## 2021-10-01 NOTE — Progress Notes (Signed)
============================================================ ============================================================  -    Total Chol      &  LDL Chol = 68   - Both  Excellent   - Very low risk for Heart Attack  / Stroke ============================================================ ============================================================  -  A1c - Normal - No Diabetes  - Great   ! ============================================================ ============================================================  -  Vitamin D = 71  - Excellent - Please keep dose same  ============================================================ ============================================================  -  All Else - CBC - Kidneys - Electrolytes - Liver - Magnesium & Thyroid    - all  Normal / OK ============================================================ ============================================================

## 2021-10-02 NOTE — Progress Notes (Signed)
============================================================ ============================================================  -    Urine Culture returned - OK - No Infection   ============================================================ ============================================================

## 2021-11-05 ENCOUNTER — Other Ambulatory Visit: Payer: Self-pay | Admitting: Adult Health Nurse Practitioner

## 2021-11-05 DIAGNOSIS — E782 Mixed hyperlipidemia: Secondary | ICD-10-CM

## 2021-11-17 ENCOUNTER — Other Ambulatory Visit: Payer: Self-pay | Admitting: Internal Medicine

## 2021-11-17 DIAGNOSIS — E039 Hypothyroidism, unspecified: Secondary | ICD-10-CM

## 2021-11-17 MED ORDER — LEVOTHYROXINE SODIUM 50 MCG PO TABS: ORAL_TABLET | ORAL | 3 refills | Status: DC

## 2021-12-25 ENCOUNTER — Ambulatory Visit: Payer: PPO | Admitting: Adult Health

## 2022-01-07 IMAGING — CR DG CHEST 2V
2 series · 2 of 2 positions shown · non-contrast
Comparison: None.

CLINICAL DATA: Labile hypertension. Former smoker. Twenty-eight
pack-year history.

EXAM:
CHEST - 2 VIEW

[w chest pa]
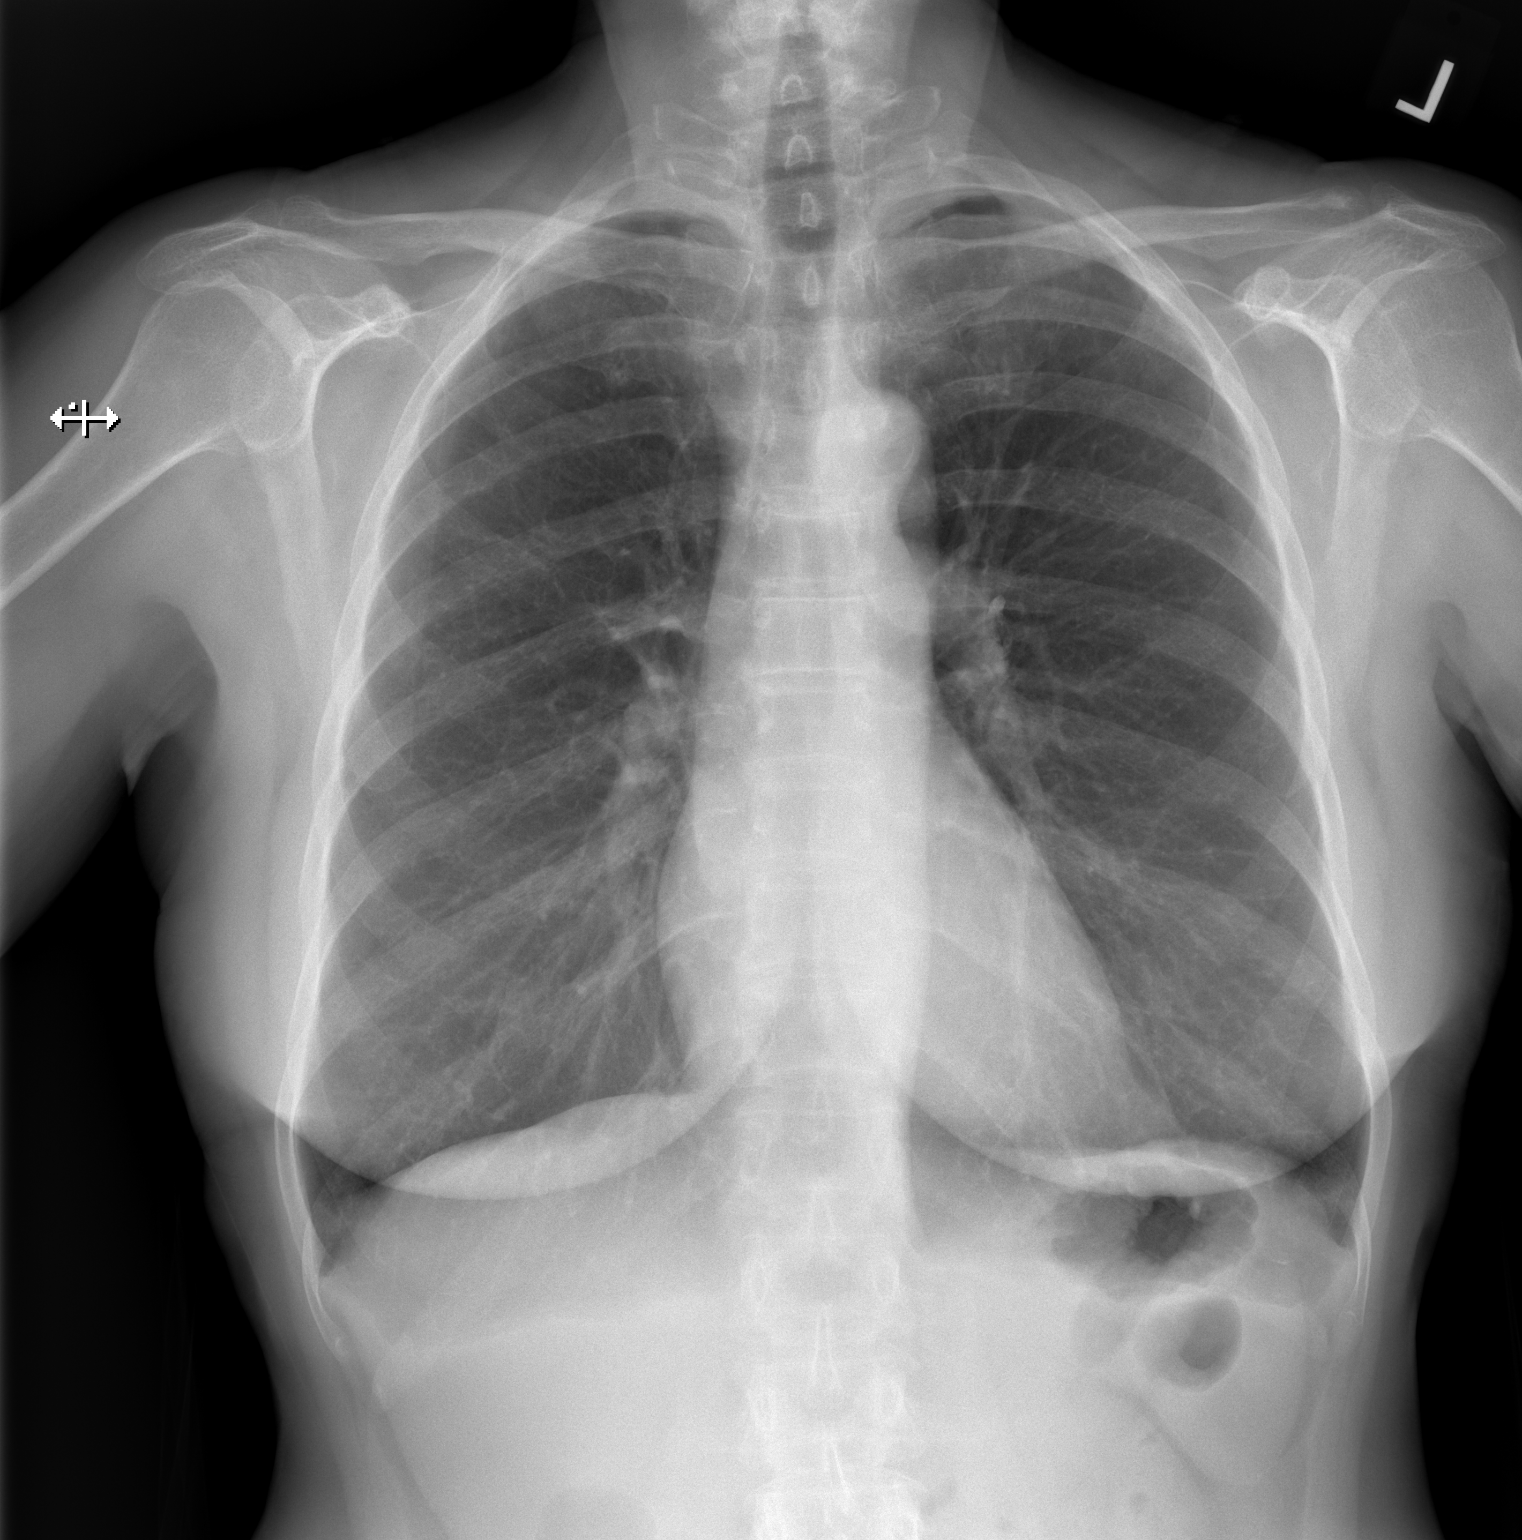

[w chest lat]
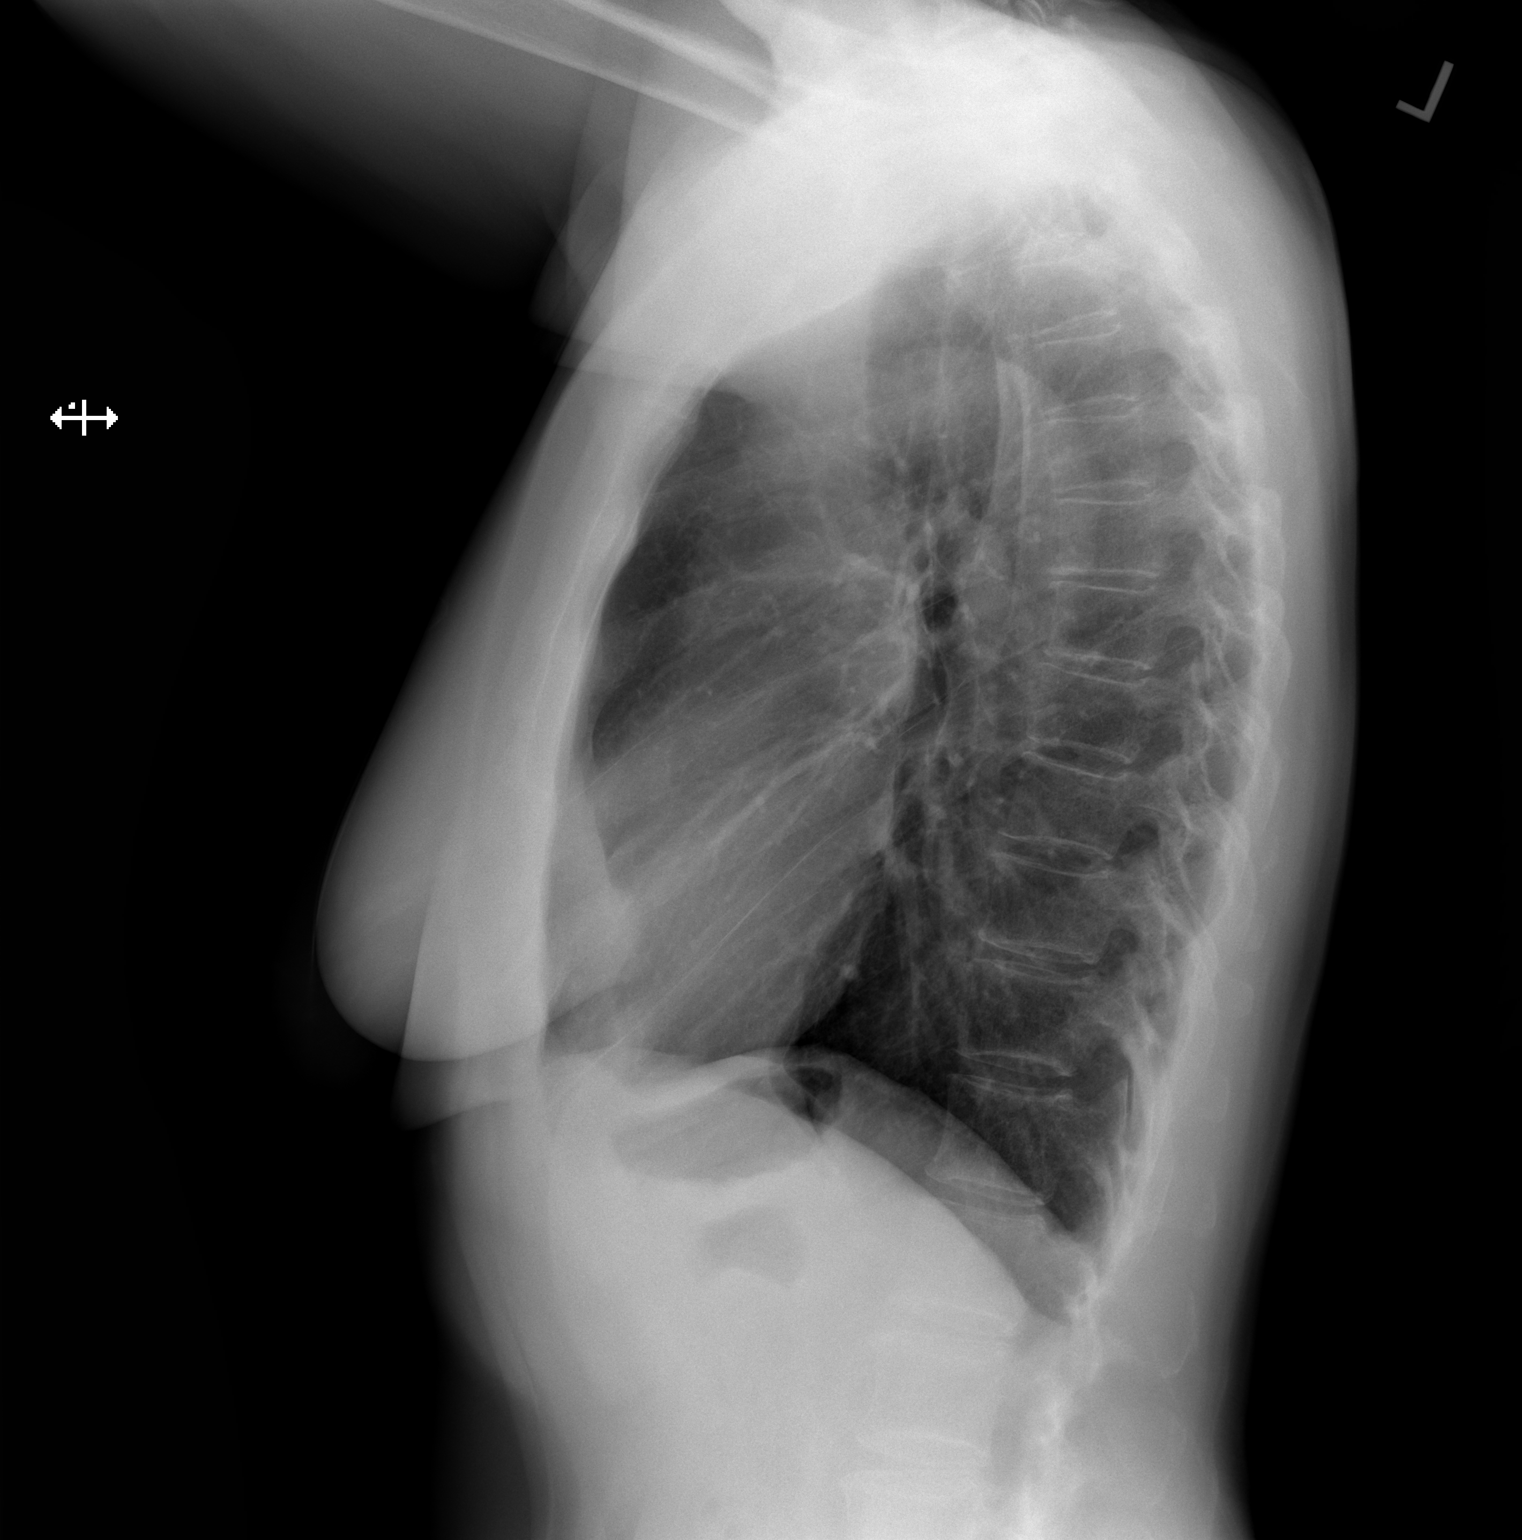

[2 of 2 positions shown; findings below may reference images not displayed]

FINDINGS: The heart size and mediastinal contours are within normal limits.
Both lungs are clear. The visualized skeletal structures are
unremarkable.
IMPRESSION: Normal exam.

## 2022-01-08 ENCOUNTER — Other Ambulatory Visit: Payer: Self-pay

## 2022-01-08 ENCOUNTER — Encounter: Payer: Self-pay | Admitting: Adult Health

## 2022-01-08 ENCOUNTER — Ambulatory Visit (INDEPENDENT_AMBULATORY_CARE_PROVIDER_SITE_OTHER): Payer: PPO | Admitting: Adult Health

## 2022-01-08 VITALS — BP 154/82 | HR 77 | Temp 97.5°F | Wt 144.0 lb

## 2022-01-08 DIAGNOSIS — H3530 Unspecified macular degeneration: Secondary | ICD-10-CM

## 2022-01-08 DIAGNOSIS — G2581 Restless legs syndrome: Secondary | ICD-10-CM | POA: Diagnosis not present

## 2022-01-08 DIAGNOSIS — R7309 Other abnormal glucose: Secondary | ICD-10-CM

## 2022-01-08 DIAGNOSIS — E039 Hypothyroidism, unspecified: Secondary | ICD-10-CM

## 2022-01-08 DIAGNOSIS — R0989 Other specified symptoms and signs involving the circulatory and respiratory systems: Secondary | ICD-10-CM | POA: Diagnosis not present

## 2022-01-08 DIAGNOSIS — R6889 Other general symptoms and signs: Secondary | ICD-10-CM | POA: Diagnosis not present

## 2022-01-08 DIAGNOSIS — Z6825 Body mass index (BMI) 25.0-25.9, adult: Secondary | ICD-10-CM

## 2022-01-08 DIAGNOSIS — E782 Mixed hyperlipidemia: Secondary | ICD-10-CM

## 2022-01-08 DIAGNOSIS — Z0000 Encounter for general adult medical examination without abnormal findings: Secondary | ICD-10-CM

## 2022-01-08 DIAGNOSIS — Z87891 Personal history of nicotine dependence: Secondary | ICD-10-CM

## 2022-01-08 DIAGNOSIS — E559 Vitamin D deficiency, unspecified: Secondary | ICD-10-CM | POA: Diagnosis not present

## 2022-01-08 DIAGNOSIS — M8589 Other specified disorders of bone density and structure, multiple sites: Secondary | ICD-10-CM

## 2022-01-08 DIAGNOSIS — Z0001 Encounter for general adult medical examination with abnormal findings: Secondary | ICD-10-CM | POA: Diagnosis not present

## 2022-01-08 DIAGNOSIS — K589 Irritable bowel syndrome without diarrhea: Secondary | ICD-10-CM | POA: Diagnosis not present

## 2022-01-08 DIAGNOSIS — K219 Gastro-esophageal reflux disease without esophagitis: Secondary | ICD-10-CM

## 2022-01-08 DIAGNOSIS — Z79899 Other long term (current) drug therapy: Secondary | ICD-10-CM | POA: Diagnosis not present

## 2022-01-08 NOTE — Patient Instructions (Addendum)
Sarah Hartman , ?Thank you for taking time to come for your Medicare Wellness Visit. I appreciate your ongoing commitment to your health goals. Please review the following plan we discussed and let me know if I can assist you in the future.  ? ?These are the goals we discussed: ? Goals   ? ?  Blood Pressure < 130/80   ?  LDL CALC < 100   ? ?  ?  ?This is a list of the screening recommended for you and due dates:  ?Health Maintenance  ?Topic Date Due  ? Zoster (Shingles) Vaccine (1 of 2) Never done  ? DEXA scan (bone density measurement)  04/27/2018  ? COVID-19 Vaccine (4 - Booster for Moderna series) 10/27/2020  ? Flu Shot  06/02/2021  ? Mammogram  04/02/2022  ? Colon Cancer Screening  09/21/2022  ? Tetanus Vaccine  12/27/2028  ? Pneumonia Vaccine  Completed  ? Hepatitis C Screening: USPSTF Recommendation to screen - Ages 53-79 yo.  Completed  ? HPV Vaccine  Aged Out  ? ? ? ?

## 2022-01-08 NOTE — Progress Notes (Signed)
MEDICARE ANNUAL WELLNESS VISIT AND FOLLOW UP  Assessment:   Diagnoses and all orders for this visit:  Annual Medicare Wellness Visit Due annually  Health maintenance reviewed  Gastroesophageal reflux disease, esophagitis presence not specified Having some breakthrough, lifestyle discussed, reduce caffeine Will try slow taper of PPI with famotidine if remains well controlled 3+ months Discussed diet, avoiding triggers and other lifestyle changes -     Magnesium  Irritable bowel syndrome, unspecified type Avoid trigger patient reports asymptomatic this year  Hypothyroidism, unspecified type continue medications the same reminded to take on an empty stomach 30-56mns before food.  -     TSH  [facility] of left [facility], [facility] Medication management -     CBC with Differential/Platelet -     CMP/GFR -     Magnesium   Mixed hyperlipidemia Continue rosuvastatin 20 mg every other day  Continue to recommend low fat/cholesterol diet, exercise, weight management -     Lipid panel  RLS (restless legs syndrome) Not currently on any prescriptions medications, doing well reportedly using an OTC topical cream that seems to help  Vitamin D deficiency Near goal at last visit; continue supplement to maintain between 60-100  Other abnormal glucose Continue to recommend diet low in processed carbohydrates, exercise, weight management Check A1C annually -     CMP/GFRWITH GFR  Former smoker (28 pack year history, quit 1982) Denies concerning sx of lung cancer; doesn't meet criteria for low dose lung cancer screening Had normal CXR 03/27/2020 Consider PRN inhaler to start with bronchitis sx during winter  Osteopenia Dr. NNori Riisfollowing, DEXA report requested Continue high calcium diet, resistance exercises; discussed vit D and K2 supplementation  Labile hypertension Persistently elevated in office today, reports consistently well controlled  at home but up in office, ? White coat hypertension Discussed BP goal of <130/80 We will have her follow up for [place] to check accuracy on home BP cuff and present log of home readings Per strong patient preference will defer on med addition at this time    Orders Placed This Encounter  Procedures   CBC with Differential/Platelet   COMPLETE METABOLIC PANEL WITH GFR   Magnesium   Lipid panel   TSH     Over 40 minutes of exam, counseling, chart review and critical decision making was performed Future Appointments  Date Time Provider DElizabeth 01/15/2022 10:00 AM [facility] NURSE [facility] None  09/30/2022  3:00 PM Sarah Pinto MD [facility] None  01/12/2023 11:00 AM CLiane Comber NP [facility] None     Plan:   During the course of the visit the patient was educated and counseled about appropriate screening and preventive services including:   Pneumococcal vaccine  Prevnar 13 Influenza vaccine Td vaccine Screening electrocardiogram Bone densitometry screening Colorectal cancer screening Diabetes screening Glaucoma screening Nutrition counseling  Advanced directives: requested   Subjective:  Sarah LOKKENis a 71y.o. female who presents for Medicare Annual Wellness Visit and 3 month follow up. She has GERD (gastroesophageal reflux disease); Macular degeneration of left eye; IBS (irritable bowel syndrome); RLS (restless legs syndrome); Labile hypertension; Vitamin D deficiency; Mixed hyperlipidemia; Other abnormal glucose; Hypothyroidism; Medication management; Osteopenia; and Former smoker (28 pack year history, quit 1982) on their problem list.  She is followed by Dr. NNori Riis(GYN) for hx of frequent UTIs, reports ?MJosph Machosyndrome dx but cannot afford the procedure, symptoms have been stable eating more cranberries/juice and stopped running.  Dr. NNori Riis  also following bone densities for osteoporosis.   She has dx of RLS, reports has OTC topical that  continues to work well for her. Denies sx in recent months.   BMI is Body mass index is 25.51 kg/m., she has been working on diet and exercise. She walks 60 min most days. Does lift weights, body weight exercises daily. 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 Wt Readings from Last 3 Encounters:  01/08/22 144 lb (65.3 kg)  09/30/21 141 lb 9.6 oz (64.2 kg)  04/03/21 143 lb 12.8 oz (65.2 kg)   she has a diagnosis of GERD which is currently managed by pantoprazole 40 mg daily, famotidine 40 mg, still has some breakthrough occasionally which she correlates to coffee.  she reports symptoms is currently well controlled, and denies breakthrough reflux, burning in chest, hoarseness or cough.    She is a former smoker, 28 pack year quit in 1982. Denies symptoms other than annual bronchitis ~2 weeks around Christmas. Didn't have last year during the pandemic, wonders if mask vs social distancing helped.   She does have BP cuff at home, checks and typically runs 120-130s/70s, prone to elevation with initial check in office. Today their BP is BP: (!) 154/82. She reports some stress related to roof leaking on her new home.  She does workout. She denies chest pain, shortness of breath, dizziness.   She is on cholesterol medication (on rosuvastatin 20 mg every other day) and denies myalgias. Her cholesterol is not at goal. The cholesterol last visit was:   Lab Results  Component Value Date   CHOL 152 09/30/2021   HDL 65 09/30/2021   LDLCALC 68 09/30/2021   TRIG 107 09/30/2021   CHOLHDL 2.3 09/30/2021    Last A1C in the office was:  Lab Results  Component Value Date   HGBA1C 5.4 09/30/2021   Last GFR: Lab Results  Component Value Date   GFRNONAA 68 04/03/2021   Patient is on Vitamin D supplement and near goal at last check  Lab Results  Component Value Date   VD25OH 21 09/30/2021     She is on thyroid medication. Her medication was not changed last visit. Takes 50 mcg daily.   Lab Results   Component Value Date   TSH 2.32 09/30/2021    Medication Review: Current Outpatient Medications on File Prior to Visit  Medication Sig Dispense Refill   Cholecalciferol (VITAMIN D3) 5000 units TABS Take 5,000 Units by mouth daily.     famotidine (PEPCID) 40 MG tablet Take 1 tablet at Bedtime for Heart burn & Acid Reflux 90 tablet 0   levothyroxine (SYNTHROID) 50 MCG tablet Take  1 tablet  Daily  on an empty stomach with only water for 30 minutes & no Antacid meds, Calcium or Magnesium for 4 hours & avoid Biotin                                                                /                            TAKE BY MOUTH ONCE DAILY 90 tablet 3   pantoprazole (PROTONIX) 40 MG tablet Take  1 tablet  Daily to Prevent Indigestion & Heartburn 90  tablet 3   rosuvastatin (CRESTOR) 20 MG tablet TAKE 1 TABLET BY MOUTH EVERY OTHER DAY FOR CHOLESTEROL 45 tablet 0   acyclovir (ZOVIRAX) 400 MG tablet Take  1 capsule  3 x /day  with Meals  &  2 capsules  at Bedtime for Mouth Ulcers (Patient not taking: Reported on 01/08/2022) 50 tablet 3   No current facility-administered medications on file prior to visit.    Allergies  Allergen Reactions   Codeine Nausea And Vomiting and Other (See Comments)    Migraine    Current Problems (verified) Patient Active Problem List   Diagnosis Date Noted   Former smoker (28 pack year history, quit 1982) 03/19/2020   Osteopenia 12/28/2017   Mixed hyperlipidemia 02/07/2015   Other abnormal glucose 02/07/2015   Hypothyroidism 02/07/2015   Medication management 02/07/2015   Vitamin D deficiency 11/15/2013   GERD (gastroesophageal reflux disease)    Macular degeneration of left eye    IBS (irritable bowel syndrome)    RLS (restless legs syndrome)    Labile hypertension     Screening Tests Immunization History  Administered Date(s) Administered   Fluad Quad(high Dose 65+) 11/06/2021   Influenza, High Dose Seasonal PF 08/01/2018, 07/07/2019   Influenza-Unspecified  07/29/2016, 09/01/2020   Moderna Sars-Covid-2 Vaccination 11/29/2019, 12/27/2019, 09/01/2020   Pneumococcal Conjugate-13 08/19/2015   Pneumococcal Polysaccharide-23 11/09/2012, 08/01/2018   Td 12/27/2018   Tdap 10/18/2008   Health Maintenance  Topic Date Due   COVID-19 Vaccine (4 - Booster for Moderna series) 10/27/2020   DEXA SCAN  04/27/2021   MAMMOGRAM  04/02/2022   COLONOSCOPY (Pts 45-48yr Insurance coverage will need to be confirmed)  09/21/2022   TETANUS/TDAP  12/27/2028   Pneumonia Vaccine 71 Years old  Completed   INFLUENZA VACCINE  Completed   Hepatitis C Screening  Completed   HPV VACCINES  Aged Out   Zoster Vaccines- Shingrix  Discontinued   Preventative care: Last colonoscopy: 09/2017 q 5 year recall per Dr. PHenrene Pastor Last mammogram: [facility] annually  Last pap smear/pelvic exam: PAP 09/2020 by Dr. NNori Riis - has scheduled 01/2022 DEXA: 04/2016 -  T - 1.7 - osteopenia - Dr. NNori Riisordered 2022 per patient only slight change, will request records today.   Shingles/Zostavax: had 1 shot of shingrix at pharmacy, Dr. MMelford Aaserecommended she not get the second shot per patient, date requested for record  Covid 19 vaccine record requested for booster reported 08/2021  Names of Other Physician/Practitioners you currently use: 1. [facility] here for primary care 2. [facility], eye doctor, last visit 2022 3. [facility] [place] last [place] 2023  Patient Care Team: Sarah Pinto MD as PCP - General (Internal Medicine) MNinetta Lights MD (Inactive) as Consulting Physician (Orthopedic Surgery) PIrene Shipper MD as Consulting Physician (Gastroenterology) EFestus Aloe MD as Consulting Physician (Urology) NMaisie Fus MD as Consulting Physician (Obstetrics and Gynecology)  SURGICAL HISTORY She  has a past surgical history that includes Colonoscopy; Tubal ligation; Tendon reconstruction (Right, 04/27/2013); and  Polypectomy. FAMILY HISTORY Her family history includes Alzheimer's disease in her mother; Cerebral aneurysm in her father; Hypertension in her father; Stomach cancer (age of onset: 863 in her paternal grandmother. SOCIAL HISTORY She  reports that she quit smoking about 40 years ago. Her smoking use included cigarettes. She started smoking about 55 years ago. She has a 28.00 pack-year smoking history. She has never used smokeless tobacco. She reports current alcohol use of about  3.0 standard drinks per week. She reports that she does not use drugs.   MEDICARE WELLNESS OBJECTIVES: Physical activity: Current Exercise Habits: Home exercise routine, Type of exercise: walking, Time (Minutes): 60, Frequency (Times/Week): 5, Weekly Exercise (Minutes/Week): 300, Intensity: Mild, Exercise limited by: None identified Cardiac risk factors: Cardiac Risk Factors include: advanced age (>25mn, >>65women);dyslipidemia;hypertension Depression/mood screen:   [facility]  Decreased Interest 0  Down, Depressed, Hopeless 0  PHQ - 2 Score 0    ADLs:  In your present state of health, do you have any difficulty performing the following activities: 01/08/2022 09/29/2021  Hearing? N N  Vision? N N  Difficulty concentrating or making decisions? N N  Walking or climbing stairs? N N  Dressing or bathing? N N  Doing errands, shopping? N N  Some recent data might be hidden     Cognitive Testing  Alert? Yes  Normal Appearance?Yes  Oriented to person? Yes  Place? Yes   Time? Yes  Recall of three objects?  Yes  Can perform simple calculations? Yes  Displays appropriate judgment?Yes  Can read the correct time from a watch face?Yes  EOL planning: Does Patient Have a Medical Advance Directive?: Yes Type of Advance Directive: Healthcare Power of Attorney, Living will Does patient want to make changes to medical advance directive?: No - Patient declined Copy of [place]  Chart?: No - copy requested  Review of Systems  Constitutional:  Negative for malaise/fatigue and weight loss.  HENT:  Negative for hearing loss and tinnitus.   Eyes:  Negative for blurred vision and double vision.  Respiratory:  Negative for cough, shortness of breath and wheezing.   Cardiovascular:  Negative for chest pain, palpitations, orthopnea, claudication and leg swelling.  Gastrointestinal:  Negative for abdominal pain, blood in stool, constipation, diarrhea, heartburn, melena, nausea and vomiting.  Genitourinary:  Negative for dysuria, flank pain, frequency, hematuria and urgency.  Musculoskeletal:  Negative for falls, joint pain and myalgias.  Skin:  Negative for rash.  Neurological:  Negative for dizziness, tingling, sensory change, weakness and headaches.  Endo/Heme/Allergies:  Negative for polydipsia.  Psychiatric/Behavioral: Negative.  Negative for depression, memory loss and substance abuse. The patient is not nervous/anxious and does not have insomnia.   All other systems reviewed and are negative.   Objective:     Today's Vitals   01/08/22 0937 01/08/22 1018  BP: (!) 150/82 (!) 154/82  Pulse: 77   Temp: (!) 97.5 F (36.4 C)   SpO2: 99%   Weight: 144 lb (65.3 kg)    Body mass index is 25.51 kg/m.  General appearance: alert, no distress, WD/WN, female HEENT: normocephalic, sclerae anicteric, TMs pearly, nares patent, no discharge or erythema, pharynx normal Oral cavity: MMM, no lesions Neck: supple, no lymphadenopathy, no thyromegaly, no masses Heart: RRR, normal S1, S2, no murmurs Lungs: CTA bilaterally, no wheezes, rhonchi, or rales Abdomen: +bs, soft, non tender, non distended, no masses, no hepatomegaly, no splenomegaly Musculoskeletal: nontender, no swelling, no obvious deformity Extremities: no edema, no cyanosis, no clubbing Pulses: 2+ symmetric, upper and lower extremities, normal cap refill Neurological: alert, oriented x 3, CN2-12 intact, strength  normal upper extremities and lower extremities, sensation normal throughout, DTRs 2+ throughout, no cerebellar signs, gait normal Psychiatric: normal affect, behavior normal, pleasant   Medicare Attestation I have personally reviewed: The patient's medical and social history Their use of alcohol, tobacco or illicit drugs Their current medications and supplements The patient's functional ability  including ADLs,fall risks, home safety risks, cognitive, and hearing and visual impairment Diet and physical activities Evidence for depression or mood disorders  The patient's weight, height, BMI, and visual acuity have been recorded in the chart.  I have made referrals, counseling, and provided education to the patient based on review of the above and I have provided the patient with a written personalized care plan for preventive services.     Izora Ribas, NP   01/08/2022

## 2022-01-09 LAB — CBC WITH DIFFERENTIAL/PLATELET
Absolute Monocytes: 242 cells/uL (ref 200–950)
Basophils Absolute: 40 cells/uL (ref 0–200)
Basophils Relative: 0.9 %
Eosinophils Absolute: 40 cells/uL (ref 15–500)
Eosinophils Relative: 0.9 %
HCT: 38.9 % (ref 35.0–45.0)
Hemoglobin: 13.1 g/dL (ref 11.7–15.5)
Lymphs Abs: 1615 cells/uL (ref 850–3900)
MCH: 30.5 pg (ref 27.0–33.0)
MCHC: 33.7 g/dL (ref 32.0–36.0)
MCV: 90.7 fL (ref 80.0–100.0)
MPV: 9.4 fL (ref 7.5–12.5)
Monocytes Relative: 5.5 %
Neutro Abs: 2464 cells/uL (ref 1500–7800)
Neutrophils Relative %: 56 %
Platelets: 324 10*3/uL (ref 140–400)
RBC: 4.29 10*6/uL (ref 3.80–5.10)
RDW: 12.4 % (ref 11.0–15.0)
Total Lymphocyte: 36.7 %
WBC: 4.4 10*3/uL (ref 3.8–10.8)

## 2022-01-09 LAB — MAGNESIUM: Magnesium: 2.1 mg/dL (ref 1.5–2.5)

## 2022-01-09 LAB — COMPLETE METABOLIC PANEL WITH GFR
AG Ratio: 2 (calc) (ref 1.0–2.5)
ALT: 14 U/L (ref 6–29)
AST: 19 U/L (ref 10–35)
Albumin: 4.9 g/dL (ref 3.6–5.1)
Alkaline phosphatase (APISO): 36 U/L — ABNORMAL LOW (ref 37–153)
BUN: 14 mg/dL (ref 7–25)
CO2: 29 mmol/L (ref 20–32)
Calcium: 10.3 mg/dL (ref 8.6–10.4)
Chloride: 105 mmol/L (ref 98–110)
Creat: 0.88 mg/dL (ref 0.60–1.00)
Globulin: 2.5 g/dL (calc) (ref 1.9–3.7)
Glucose, Bld: 97 mg/dL (ref 65–99)
Potassium: 4.4 mmol/L (ref 3.5–5.3)
Sodium: 142 mmol/L (ref 135–146)
Total Bilirubin: 0.6 mg/dL (ref 0.2–1.2)
Total Protein: 7.4 g/dL (ref 6.1–8.1)
eGFR: 70 mL/min/{1.73_m2} (ref >=60)

## 2022-01-09 LAB — TSH: TSH: 2.44 mIU/L (ref 0.40–4.50)

## 2022-01-09 LAB — LIPID PANEL
Cholesterol: 146 mg/dL (ref <200)
HDL: 73 mg/dL (ref >=50)
LDL Cholesterol (Calc): 57 mg/dL (calc)
Non-HDL Cholesterol (Calc): 73 mg/dL (calc) (ref <130)
Total CHOL/HDL Ratio: 2 (calc) (ref <5.0)
Triglycerides: 78 mg/dL (ref <150)

## 2022-01-15 ENCOUNTER — Ambulatory Visit (INDEPENDENT_AMBULATORY_CARE_PROVIDER_SITE_OTHER): Payer: PPO

## 2022-01-15 ENCOUNTER — Other Ambulatory Visit: Payer: Self-pay

## 2022-01-15 DIAGNOSIS — R0989 Other specified symptoms and signs involving the circulatory and respiratory systems: Secondary | ICD-10-CM | POA: Diagnosis not present

## 2022-01-15 NOTE — Progress Notes (Signed)
Patient her for a BP check and she brought her machine. She only had 2 logged blood pressures which were, 01/09/22  138/75 and 01/14/22  132/62.  ?We checked her machine and got 151/86. Her machine is pretty old so I recommended she get a new one. I told her to continue monitoring her BP at least once a day and not when she first wakes up. Patient said she feels great and is taking dace classes and walks for exercise. You had asked her for her last Shingles vaccine ( 9/20150 and her last Covid booster vaccine (09/10/21). I have entered them in her cart.    ?

## 2022-01-16 ENCOUNTER — Ambulatory Visit: Payer: PPO

## 2022-01-26 ENCOUNTER — Other Ambulatory Visit: Payer: Self-pay | Admitting: Internal Medicine

## 2022-01-26 DIAGNOSIS — E782 Mixed hyperlipidemia: Secondary | ICD-10-CM

## 2022-01-27 ENCOUNTER — Other Ambulatory Visit: Payer: Self-pay | Admitting: Internal Medicine

## 2022-01-27 DIAGNOSIS — E782 Mixed hyperlipidemia: Secondary | ICD-10-CM

## 2022-01-27 DIAGNOSIS — K219 Gastro-esophageal reflux disease without esophagitis: Secondary | ICD-10-CM

## 2022-01-27 MED ORDER — ROSUVASTATIN CALCIUM 20 MG PO TABS: ORAL_TABLET | ORAL | 3 refills | Status: DC

## 2022-02-09 ENCOUNTER — Encounter: Payer: Self-pay | Admitting: Internal Medicine

## 2022-02-17 ENCOUNTER — Other Ambulatory Visit: Payer: Self-pay | Admitting: Internal Medicine

## 2022-02-17 DIAGNOSIS — E039 Hypothyroidism, unspecified: Secondary | ICD-10-CM

## 2022-02-20 DIAGNOSIS — H524 Presbyopia: Secondary | ICD-10-CM | POA: Diagnosis not present

## 2022-02-20 DIAGNOSIS — H43812 Vitreous degeneration, left eye: Secondary | ICD-10-CM | POA: Diagnosis not present

## 2022-02-20 DIAGNOSIS — D23122 Other benign neoplasm of skin of left lower eyelid, including canthus: Secondary | ICD-10-CM | POA: Diagnosis not present

## 2022-02-20 DIAGNOSIS — H353131 Nonexudative age-related macular degeneration, bilateral, early dry stage: Secondary | ICD-10-CM | POA: Diagnosis not present

## 2022-02-20 DIAGNOSIS — H2513 Age-related nuclear cataract, bilateral: Secondary | ICD-10-CM | POA: Diagnosis not present

## 2022-03-09 DIAGNOSIS — Z01419 Encounter for gynecological examination (general) (routine) without abnormal findings: Secondary | ICD-10-CM | POA: Diagnosis not present

## 2022-03-09 DIAGNOSIS — Z6825 Body mass index (BMI) 25.0-25.9, adult: Secondary | ICD-10-CM | POA: Diagnosis not present

## 2022-03-11 ENCOUNTER — Other Ambulatory Visit: Payer: Self-pay | Admitting: Adult Health

## 2022-03-11 DIAGNOSIS — K219 Gastro-esophageal reflux disease without esophagitis: Secondary | ICD-10-CM

## 2022-04-08 DIAGNOSIS — Z1231 Encounter for screening mammogram for malignant neoplasm of breast: Secondary | ICD-10-CM | POA: Diagnosis not present

## 2022-04-08 LAB — HM MAMMOGRAPHY

## 2022-04-09 ENCOUNTER — Encounter: Payer: Self-pay | Admitting: Internal Medicine

## 2022-08-06 ENCOUNTER — Encounter: Payer: Self-pay | Admitting: Internal Medicine

## 2022-09-02 ENCOUNTER — Ambulatory Visit (AMBULATORY_SURGERY_CENTER): Payer: Self-pay | Admitting: *Deleted

## 2022-09-02 VITALS — Ht 63.0 in | Wt 148.6 lb

## 2022-09-02 DIAGNOSIS — Z86010 Personal history of colonic polyps: Secondary | ICD-10-CM

## 2022-09-02 MED ORDER — NA SULFATE-K SULFATE-MG SULF 17.5-3.13-1.6 GM/177ML PO SOLN: 1.0000 | Freq: Once | ORAL | 0 refills | Status: AC

## 2022-09-02 NOTE — Progress Notes (Signed)
No egg or soy allergy known to patient  Pt has hx of PONV, denies issues with colonoscopy Patient denies ever being told they had issues or difficulty with intubation  No FH of Malignant Hyperthermia Pt is not on diet pills Pt is not on  home 02  Pt is not on blood thinners  Pt denies issues with constipation  Pt encouraged to use to use Singlecare or Goodrx to reduce cost  Patient's chart reviewed by Osvaldo Angst CNRA prior to previsit and patient appropriate for the [facility].  Previsit completed and red dot placed by patient's name on their procedure day (on provider's schedule).

## 2022-09-17 ENCOUNTER — Encounter: Payer: Self-pay | Admitting: Internal Medicine

## 2022-09-28 ENCOUNTER — Encounter: Payer: Self-pay | Admitting: Internal Medicine

## 2022-09-28 ENCOUNTER — Ambulatory Visit (AMBULATORY_SURGERY_CENTER): Payer: PPO | Admitting: Internal Medicine

## 2022-09-28 VITALS — BP 129/83 | HR 59 | Temp 98.2°F | Resp 11 | Ht 63.0 in | Wt 148.6 lb

## 2022-09-28 DIAGNOSIS — E039 Hypothyroidism, unspecified: Secondary | ICD-10-CM | POA: Diagnosis not present

## 2022-09-28 DIAGNOSIS — Z86010 Personal history of colonic polyps: Secondary | ICD-10-CM

## 2022-09-28 DIAGNOSIS — G2581 Restless legs syndrome: Secondary | ICD-10-CM | POA: Diagnosis not present

## 2022-09-28 DIAGNOSIS — Z09 Encounter for follow-up examination after completed treatment for conditions other than malignant neoplasm: Secondary | ICD-10-CM

## 2022-09-28 MED ORDER — SODIUM CHLORIDE 0.9 % IV SOLN: 500.0000 mL | Freq: Once | INTRAVENOUS | Status: DC

## 2022-09-28 NOTE — Progress Notes (Signed)
HISTORY OF PRESENT ILLNESS:  Sarah Hartman is a 71 y.o. female with a history of multiple adenomatous colon polyps.  Presents today for surveillance colonoscopy.  No complaints  REVIEW OF SYSTEMS:  All non-GI ROS negative. Past Medical History:  Diagnosis Date   Adenomatous colon polyp    Allergy    GERD (gastroesophageal reflux disease)    Heart murmur    history of as teenager    Hyperlipidemia    IBS (irritable bowel syndrome)    Labile hypertension    "white coat" per pt   Macular degeneration of left eye    RLS (restless legs syndrome)    Thyroid disease    hypothyroid   Vitamin D deficiency     Past Surgical History:  Procedure Laterality Date   COLONOSCOPY     POLYPECTOMY     TENDON RECONSTRUCTION Right 04/27/2013   Procedure: RIGHT ELBOW DEBRIDE/REPAIR/EXPLORE - LATERAL HUMERAL EPICONDYLE;  Surgeon: Ninetta Lights, MD;  Location: [facility];  Service: Orthopedics;  Laterality: Right;   TUBAL LIGATION      Social History Sarah Hartman  reports that she quit smoking about 41 years ago. Her smoking use included cigarettes. She started smoking about 55 years ago. She has a 28.00 pack-year smoking history. She has never used smokeless tobacco. She reports current alcohol use of about 3.0 standard drinks of alcohol per week. She reports that she does not use drugs.  family history includes Alzheimer's disease in her mother; Cerebral aneurysm in her father; Hypertension in her father; Stomach cancer (age of onset: 68) in her paternal grandmother.  Allergies  Allergen Reactions   Codeine Nausea And Vomiting and Other (See Comments)    Migraine       PHYSICAL EXAMINATION: Vital signs: BP (!) 164/86   Pulse 87   Temp 98.2 F (36.8 C) (Skin)   Ht '5\' 3"'$  (1.6 m)   Wt 148 lb 9.6 oz (67.4 kg)   SpO2 98%   BMI 26.32 kg/m  General: Well-developed, well-nourished, no acute distress HEENT: Sclerae are anicteric, conjunctiva pink. Oral mucosa  intact Lungs: Clear Heart: Regular Abdomen: soft, nontender, nondistended, no obvious ascites, no peritoneal signs, normal bowel sounds. No organomegaly. Extremities: No edema Psychiatric: alert and oriented x3. Cooperative     ASSESSMENT:  History of multiple adenomatous colon polyps   PLAN:   Surveillance colonoscopy

## 2022-09-28 NOTE — Op Note (Signed)
[facility] Patient Name: Clint Strupp Procedure Date: 09/28/2022 11:37 AM MRN: [id number] Endoscopist: Docia Chuck. Henrene Pastor , MD, [id number] Age: 71 Referring MD:  Date of Birth: 1950-11-23 Gender: Female Account #: [id number] Procedure:                Colonoscopy Indications:              High risk colon cancer surveillance: Personal                            history of multiple (3 or more) adenomas. Previous                            examinations 2004, 2008, 2013, 2018 Medicines:                Monitored Anesthesia Care Procedure:                Pre-Anesthesia Assessment:                           - Prior to the procedure, a History and Physical                            was performed, and patient medications and                            allergies were reviewed. The patient's tolerance of                            previous anesthesia was also reviewed. The risks                            and benefits of the procedure and the sedation                            options and risks were discussed with the patient.                            All questions were answered, and informed consent                            was obtained. Prior Anticoagulants: The patient has                            taken no anticoagulant or antiplatelet agents. ASA                            Grade Assessment: II - A patient with mild systemic                            disease. After reviewing the risks and benefits,                            the patient was deemed in satisfactory condition to  undergo the procedure.                           After obtaining informed consent, the colonoscope                            was passed under direct vision. Throughout the                            procedure, the patient's blood pressure, pulse, and                            oxygen saturations were monitored continuously. The                            Olympus CF-HQ190L  [id number]) Colonoscope was                            introduced through the anus and advanced to the the                            cecum, identified by appendiceal orifice and                            ileocecal valve. The ileocecal valve, appendiceal                            orifice, and rectum were photographed. The quality                            of the bowel preparation was excellent. The                            colonoscopy was performed without difficulty. The                            patient tolerated the procedure well. The bowel                            preparation used was SUPREP via split dose                            instruction. Scope In: 11:51:13 AM Scope Out: 12:02:27 PM Scope Withdrawal Time: 0 hours 9 minutes 4 seconds  Total Procedure Duration: 0 hours 11 minutes 14 seconds  Findings:                 The entire examined colon appeared normal on direct                            and retroflexion views. Complications:            No immediate complications. Estimated blood loss:                            None. Estimated Blood Loss:  Estimated blood loss: none. Impression:               - The entire examined colon is normal on direct and                            retroflexion views.                           - No specimens collected. Recommendation:           - Repeat colonoscopy is not recommended for                            surveillance.                           - Patient has a contact number available for                            emergencies. The signs and symptoms of potential                            delayed complications were discussed with the                            patient. Return to normal activities tomorrow.                            Written discharge instructions were provided to the                            patient.                           - Resume previous diet.                           - Continue present  medications. Docia Chuck. Henrene Pastor, MD 09/28/2022 12:06:26 PM This report has been signed electronically.

## 2022-09-28 NOTE — Progress Notes (Signed)
Sedate, gd SR, tolerated procedure well, VSS, report to RN 

## 2022-09-28 NOTE — Patient Instructions (Signed)
YOU HAD AN ENDOSCOPIC PROCEDURE TODAY AT THE [facility]:   Refer to the procedure report that was given to you for any specific questions about what was found during the examination.  If the procedure report does not answer your questions, please call your gastroenterologist to clarify.  If you requested that your care partner not be given the details of your procedure findings, then the procedure report has been included in a sealed envelope for you to review at your convenience later.  YOU SHOULD EXPECT: Some feelings of bloating in the abdomen. Passage of more gas than usual.  Walking can help get rid of the air that was put into your GI tract during the procedure and reduce the bloating. If you had a lower endoscopy (such as a colonoscopy or flexible sigmoidoscopy) you may notice spotting of blood in your stool or on the toilet paper. If you underwent a bowel prep for your procedure, you may not have a normal bowel movement for a few days.  Please Note:  You might notice some irritation and congestion in your nose or some drainage.  This is from the oxygen used during your procedure.  There is no need for concern and it should clear up in a day or so.  SYMPTOMS TO REPORT IMMEDIATELY:  Following lower endoscopy (colonoscopy or flexible sigmoidoscopy):  Excessive amounts of blood in the stool  Significant tenderness or worsening of abdominal pains  Swelling of the abdomen that is new, acute  Fever of 100F or higher  For urgent or emergent issues, a gastroenterologist can be reached at any hour by calling [phone]. Do not use MyChart messaging for urgent concerns.    DIET:  We do recommend a small meal at first, but then you may proceed to your regular diet.  Drink plenty of fluids but you should avoid alcoholic beverages for 24 hours.  ACTIVITY:  You should plan to take it easy for the rest of today and you should NOT DRIVE or use heavy machinery until tomorrow (because of  the sedation medicines used during the test).    FOLLOW UP: Our staff will call the number listed on your records the next business day following your procedure.  We will call around 7:15- 8:00 am to check on you and address any questions or concerns that you may have regarding the information given to you following your procedure. If we do not reach you, we will leave a message.     If any biopsies were taken you will be contacted by phone or by letter within the next 1-3 weeks.  Please call us at [phone] if you have not heard about the biopsies in 3 weeks.    SIGNATURES/CONFIDENTIALITY: You and/or your care partner have signed paperwork which will be entered into your electronic medical record.  These signatures attest to the fact that that the information above on your After Visit Summary has been reviewed and is understood.  Full responsibility of the confidentiality of this discharge information lies with you and/or your care-partner.  

## 2022-09-28 NOTE — Progress Notes (Signed)
Pt's states no medical or surgical changes since previsit or office visit. VS assessed by D.T 

## 2022-09-29 ENCOUNTER — Telehealth: Payer: Self-pay | Admitting: *Deleted

## 2022-09-29 NOTE — Telephone Encounter (Signed)
  Follow up Call-     09/28/2022   10:37 AM  Call back number  Post procedure Call Back phone  # [phone]  Permission to leave phone message Yes     Patient questions:  Do you have a fever, pain , or abdominal swelling? No. Pain Score  0 *  Have you tolerated food without any problems? Yes.    Have you been able to return to your normal activities? Yes.    Do you have any questions about your discharge instructions: Diet   No. Medications  No. Follow up visit  No.  Do you have questions or concerns about your Care? No.  Actions: * If pain score is 4 or above: No action needed, pain <4.

## 2022-09-30 ENCOUNTER — Encounter: Payer: PPO | Admitting: Internal Medicine

## 2022-10-26 ENCOUNTER — Encounter: Payer: Self-pay | Admitting: Internal Medicine

## 2022-10-26 NOTE — Patient Instructions (Signed)
Due to recent changes in healthcare laws, you Dudash see the results of your imaging and laboratory studies on MyChart before your provider has had a chance to review them.  We understand that in some cases there Gin be results that are confusing or concerning to you. Not all laboratory results come back in the same time frame and the provider Hoback be waiting for multiple results in order to interpret others.  Please give [place] 48 hours in order for your provider to thoroughly review all the results before contacting the office for clarification of your results.   +++++++++++++++++++++++++  Vit D  & Vit C 1,000 mg   are recommended to help protect  against the Covid-19 and other Corona viruses.    Also it's recommended  to take  Zinc 50 mg  to help  protect against the Covid-19   and best place to get  is also on [email or web address]  and don't pay more than 6-8 cents /pill !  ================================ Coronavirus (COVID-19) Are you at risk?  Are you at risk for the Coronavirus (COVID-19)?  To be considered HIGH RISK for Coronavirus (COVID-19), you have to meet the following criteria:  Traveled to [place], [place], [place], [place] or [place]; or in the [place] to [place], [place], [place]  or [place]; and have fever, cough, and shortness of breath within the last 2 weeks of travel OR Been in close contact with a person diagnosed with COVID-19 within the last 2 weeks and have  fever, cough,and shortness of breath  IF YOU DO NOT MEET THESE CRITERIA, YOU ARE CONSIDERED LOW RISK FOR COVID-19.  What to do if you are HIGH RISK for COVID-19?  If you are having a medical emergency, call 911. Seek medical care right away. Before you go to a doctor's office, urgent care or emergency department,  call ahead and tell them about your recent travel, contact with someone diagnosed with COVID-19   and your symptoms.  You should receive instructions from your physician's office regarding next  steps of care.  When you arrive at healthcare provider, tell the healthcare staff immediately you have returned from  visiting [place], [place], [place], [place] or [place]; or traveled in the [place] to [place], [place],  [place] or [place] in the last two weeks or you have been in close contact with a person diagnosed with  COVID-19 in the last 2 weeks.   Tell the health care staff about your symptoms: fever, cough and shortness of breath. After you have been seen by a medical provider, you will be either: Tested for (COVID-19) and discharged home on quarantine except to seek medical care if  symptoms worsen, and asked to  Stay home and avoid contact with others until you get your results (4-5 days)  Avoid travel on public transportation if possible (such as bus, train, or airplane) or Sent to the Emergency Department by EMS for evaluation, COVID-19 testing  and  possible admission depending on your condition and test results.  What to do if you are LOW RISK for COVID-19?  Reduce your risk of any infection by using the same precautions used for avoiding the common cold or flu:  Wash your hands often with soap and warm water for at least 20 seconds.  If soap and water are not readily available,  use an alcohol-based hand sanitizer with at least 60% alcohol.  If coughing or sneezing, cover your mouth and nose by coughing  or sneezing into the elbow areas of your shirt or coat,  into a tissue or into your sleeve (not your hands). Avoid shaking hands with others and consider head nods or verbal greetings only. Avoid touching your eyes, nose, or mouth with unwashed hands.  Avoid close contact with people who are sick. Avoid places or events with large numbers of people in one location, like concerts or sporting events. Carefully consider travel plans you have or are making. If you are planning any travel outside or inside the [place], visit the CDC's Travelers' Health webpage for the  latest health notices. If you have some symptoms but not all symptoms, continue to monitor at home and seek medical attention  if your symptoms worsen. If you are having a medical emergency, call 911.   >>>>>>>>>>>>>>>>>>>>>>>>>>>>>>>>>  We Do NOT Approve of [employer] > > > > > > > > > > > > > > > > > > > > > > > > > > > > > > > > > > > > > > >  Preventive Care for Adults  A healthy lifestyle and preventive care can promote health and wellness. Preventive health guidelines for women include the following key practices. A routine yearly physical is a good way to check with your health care provider about your health and preventive screening. It is a chance to share any concerns and updates on your health and to receive a thorough exam. Visit your dentist for a routine exam and preventive care every 6 months. Brush your teeth twice a day and floss once a day. Good oral hygiene prevents tooth decay and gum disease. The frequency of eye exams is based on your age, health, family medical history, use of contact lenses, and other factors. Follow your health care provider's recommendations for frequency of eye exams. Eat a healthy diet. Foods like vegetables, fruits, whole grains, low-fat dairy products, and lean protein foods contain the nutrients you need without too many calories. Decrease your intake of foods high in solid fats, added sugars, and salt. Eat the right amount of calories for you. Get information about a proper diet from your health care provider, if necessary. Regular physical exercise is one of the most important things you can do for your health. Most adults should get at least 150 minutes of moderate-intensity exercise (any activity that increases your heart rate and causes you to sweat) each week. In addition, most adults need muscle-strengthening exercises on 2 or more days a week. Maintain a healthy weight. The body mass index (BMI) is a screening tool to identify  possible weight problems. It provides an estimate of body fat based on height and weight. Your health care provider can find your BMI and can help you achieve or maintain a healthy weight. For adults 20 years and older: A BMI below 18.5 is considered underweight. A BMI of 18.5 to 24.9 is normal. A BMI of 25 to 29.9 is considered overweight. A BMI of 30 and above is considered obese. Maintain normal blood lipids and cholesterol levels by exercising and minimizing your intake of saturated fat. Eat a balanced diet with plenty of fruit and vegetables. If your lipid or cholesterol levels are high, you are over 50, or you are at high risk for heart disease, you may need your cholesterol levels checked more frequently. Ongoing high lipid and cholesterol levels should be treated with medicines if diet and exercise are not working. If you smoke, find out from  your health care provider how to quit. If you do not use tobacco, do not start. Lung cancer screening is recommended for adults aged 55-80 years who are at high risk for developing lung cancer because of a history of smoking. A yearly low-dose CT scan of the lungs is recommended for people who have at least a 30-pack-year history of smoking and are a current smoker or have quit within the past 15 years. A pack year of smoking is smoking an average of 1 pack of cigarettes a day for 1 year (for example: 1 pack a day for 30 years or 2 packs a day for 15 years). Yearly screening should continue until the smoker has stopped smoking for at least 15 years. Yearly screening should be stopped for people who develop a health problem that would prevent them from having lung cancer treatment. Avoid use of street drugs. Do not share needles with anyone. Ask for help if you need support or instructions about stopping the use of drugs. High blood pressure causes heart disease and increases the risk of stroke.  Ongoing high blood pressure should be treated with medicines if  weight loss and exercise do not work. If you are 55-79 years old, ask your health care provider if you should take aspirin to prevent strokes. Diabetes screening involves taking a blood sample to check your fasting blood sugar level. This should be done once every 3 years, after age 45, if you are within normal weight and without risk factors for diabetes. Testing should be considered at a younger age or be carried out more frequently if you are overweight and have at least 1 risk factor for diabetes. Breast cancer screening is essential preventive care for women. You should practice "breast self-awareness." This means understanding the normal appearance and feel of your breasts and may include breast self-examination. Any changes detected, no matter how small, should be reported to a health care provider. Women in their 20s and 30s should have a clinical breast exam (CBE) by a health care provider as part of a regular health exam every 1 to 3 years. After age 40, women should have a CBE every year. Starting at age 40, women should consider having a mammogram (breast X-ray test) every year. Women who have a family history of breast cancer should talk to their health care provider about genetic screening. Women at a high risk of breast cancer should talk to their health care providers about having an MRI and a mammogram every year. Breast cancer gene (BRCA)-related cancer risk assessment is recommended for women who have family members with BRCA-related cancers. BRCA-related cancers include breast, ovarian, tubal, and peritoneal cancers. Having family members with these cancers may be associated with an increased risk for harmful changes (mutations) in the breast cancer genes BRCA1 and BRCA2. Results of the assessment will determine the need for genetic counseling and BRCA1 and BRCA2 testing. Routine pelvic exams to screen for cancer are no longer recommended for nonpregnant women who are considered low risk for  cancer of the pelvic organs (ovaries, uterus, and vagina) and who do not have symptoms. Ask your health care provider if a screening pelvic exam is right for you. If you have had past treatment for cervical cancer or a condition that could lead to cancer, you need Pap tests and screening for cancer for at least 20 years after your treatment. If Pap tests have been discontinued, your risk factors (such as having a new sexual partner) need to be   reassessed to determine if screening should be resumed. Some women have medical problems that increase the chance of getting cervical cancer. In these cases, your health care provider may recommend more frequent screening and Pap tests.  Colorectal cancer can be detected and often prevented. Most routine colorectal cancer screening begins at the age of 9 years and continues through age 47 years. However, your health care provider may recommend screening at an earlier age if you have risk factors for colon cancer. On a yearly basis, your health care provider may provide home test kits to check for hidden blood in the stool. Use of a small camera at the end of a tube, to directly examine the colon (sigmoidoscopy or colonoscopy), can detect the earliest forms of colorectal cancer. Talk to your health care provider about this at age 66, when routine screening begins.  Direct exam of the colon should be repeated every 5-10 years through age 98 years, unless early forms of pre-cancerous polyps or small growths are found. Osteoporosis is a disease in which the bones lose minerals and strength with aging. This can result in serious bone fractures or breaks. The risk of osteoporosis can be identified using a bone density scan. Women ages 48 years and over and women at risk for fractures or osteoporosis should discuss screening with their health care providers. Ask your health care provider whether you should take a calcium supplement or vitamin D to reduce the rate of  osteoporosis. Menopause can be associated with physical symptoms and risks. Hormone replacement therapy is available to decrease symptoms and risks. You should talk to your health care provider about whether hormone replacement therapy is right for you. Use sunscreen. Apply sunscreen liberally and repeatedly throughout the day. You should seek shade when your shadow is shorter than you. Protect yourself by wearing long sleeves, pants, a wide-brimmed hat, and sunglasses year round, whenever you are outdoors. Once a month, do a whole body skin exam, using a mirror to look at the skin on your back. Tell your health care provider of new moles, moles that have irregular borders, moles that are larger than a pencil eraser, or moles that have changed in shape or color. Stay current with required vaccines (immunizations). Influenza vaccine. All adults should be immunized every year. Tetanus, diphtheria, and acellular pertussis (Td, Tdap) vaccine. Pregnant women should receive 1 dose of Tdap vaccine during each pregnancy. The dose should be obtained regardless of the length of time since the last dose. Immunization is preferred during the 27th-36th week of gestation. An adult who has not previously received Tdap or who does not know her vaccine status should receive 1 dose of Tdap. This initial dose should be followed by tetanus and diphtheria toxoids (Td) booster doses every 10 years. Adults with an unknown or incomplete history of completing a 3-dose immunization series with Td-containing vaccines should begin or complete a primary immunization series including a Tdap dose. Adults should receive a Td booster every 10 years.  Zoster vaccine. One dose is recommended for adults aged 47 years or older unless certain conditions are present.  Pneumococcal 13-valent conjugate (PCV13) vaccine. When indicated, a person who is uncertain of her immunization history and has no record of immunization should receive the PCV13  vaccine. An adult aged 63 years or older who has certain medical conditions and has not been previously immunized should receive 1 dose of PCV13 vaccine. This PCV13 should be followed with a dose of pneumococcal polysaccharide (PPSV23) vaccine. The PPSV23  vaccine dose should be obtained at least 1 or more year(s) after the dose of PCV13 vaccine. An adult aged 19 years or older who has certain medical conditions and previously received 1 or more doses of PPSV23 vaccine should receive 1 dose of PCV13. The PCV13 vaccine dose should be obtained 1 or more years after the last PPSV23 vaccine dose.  Pneumococcal polysaccharide (PPSV23) vaccine. When PCV13 is also indicated, PCV13 should be obtained first. All adults aged 65 years and older should be immunized. An adult younger than age 65 years who has certain medical conditions should be immunized. Any person who resides in a nursing home or long-term care facility should be immunized. An adult smoker should be immunized. People with an immunocompromised condition and certain other conditions should receive both PCV13 and PPSV23 vaccines. People with human immunodeficiency virus (HIV) infection should be immunized as soon as possible after diagnosis. Immunization during chemotherapy or radiation therapy should be avoided. Routine use of PPSV23 vaccine is not recommended for American Indians, [place] Natives, or people younger than 65 years unless there are medical conditions that require PPSV23 vaccine. When indicated, people who have unknown immunization and have no record of immunization should receive PPSV23 vaccine. One-time revaccination 5 years after the first dose of PPSV23 is recommended for people aged 19-64 years who have chronic kidney failure, nephrotic syndrome, asplenia, or immunocompromised conditions. People who received 1-2 doses of PPSV23 before age 65 years should receive another dose of PPSV23 vaccine at age 65 years or later if at least 5 years have  passed since the previous dose. Doses of PPSV23 are not needed for people immunized with PPSV23 at or after age 65 years.  Preventive Services / Frequency  Ages 65 years and over Blood pressure check. Lipid and cholesterol check. Lung cancer screening. / Every year if you are aged 55-80 years and have a 30-pack-year history of smoking and currently smoke or have quit within the past 15 years. Yearly screening is stopped once you have quit smoking for at least 15 years or develop a health problem that would prevent you from having lung cancer treatment. Clinical breast exam.** / Every year after age 40 years.  BRCA-related cancer risk assessment.** / For women who have family members with a BRCA-related cancer (breast, ovarian, tubal, or peritoneal cancers). Mammogram.** / Every year beginning at age 40 years and continuing for as long as you are in good health. Consult with your health care provider. Pap test.** / Every 3 years starting at age 30 years through age 65 or 70 years with 3 consecutive normal Pap tests. Testing can be stopped between 65 and 70 years with 3 consecutive normal Pap tests and no abnormal Pap or HPV tests in the past 10 years. Fecal occult blood test (FOBT) of stool. / Every year beginning at age 50 years and continuing until age 75 years. You may not need to do this test if you get a colonoscopy every 10 years. Flexible sigmoidoscopy or colonoscopy.** / Every 5 years for a flexible sigmoidoscopy or every 10 years for a colonoscopy beginning at age 50 years and continuing until age 75 years. Hepatitis C blood test.** / For all people born from 1945 through 1965 and any individual with known risks for hepatitis C. Osteoporosis screening.** / A one-time screening for women ages 65 years and over and women at risk for fractures or osteoporosis. Skin self-exam. / Monthly. Influenza vaccine. / Every year. Tetanus, diphtheria, and acellular pertussis (Tdap/Td) vaccine.** /   1 dose  of Td every 10 years. Zoster vaccine.** / 1 dose for adults aged 60 years or older. Pneumococcal 13-valent conjugate (PCV13) vaccine.** / Consult your health care provider. Pneumococcal polysaccharide (PPSV23) vaccine.** / 1 dose for all adults aged 65 years and older. Screening for abdominal aortic aneurysm (AAA)  by ultrasound is recommended for people who have history of high blood pressure or who are current or former smokers. ++++++++++++++++++++ Recommend Adult Low Dose Aspirin or  coated  Aspirin 81 mg daily  To reduce risk of Colon Cancer 40 %,  Skin Cancer 26 % ,  Melanoma 46%  and  Pancreatic cancer 60% ++++++++++++++++++++ Vitamin D goal  is between 70-100.  Please make sure that you are taking your Vitamin D as directed.  It is very important as a natural anti-inflammatory  helping hair, skin, and nails, as well as reducing stroke and heart attack risk.  It helps your bones and helps with mood. It also decreases numerous cancer risks so please take it as directed.  Low Vit D is associated with a 200-300% higher risk for CANCER  and 200-300% higher risk for HEART   ATTACK  &  STROKE.   ...................................... It is also associated with higher death rate at younger ages,  autoimmune diseases like Rheumatoid arthritis, Lupus, Multiple Sclerosis.    Also many other serious conditions, like depression, Alzheimer's Dementia, infertility, muscle aches, fatigue, fibromyalgia - just to name a few. ++++++++++++++++++ Recommend the book "The END of DIETING" by Dr Joel Fuhrman  & the book "The END of DIABETES " by Dr Joel Fuhrman At [employer] - get book & Audio CD's    Being diabetic has a  300% increased risk for heart attack, stroke, cancer, and alzheimer- type vascular dementia. It is very important that you work harder with diet by avoiding all foods that are white. Avoid white rice (brown & wild rice is OK), white potatoes (sweetpotatoes in moderation is OK),  White bread or wheat bread or anything made out of white flour like bagels, donuts, rolls, buns, biscuits, cakes, pastries, cookies, pizza crust, and pasta (made from white flour & egg whites) - vegetarian pasta or spinach or wheat pasta is OK. Multigrain breads like Arnold's or [employer], or multigrain sandwich thins or flatbreads.  Diet, exercise and weight loss can reverse and cure diabetes in the early stages.  Diet, exercise and weight loss is very important in the control and prevention of complications of diabetes which affects every system in your body, ie. Brain - dementia/stroke, eyes - glaucoma/blindness, heart - heart attack/heart failure, kidneys - dialysis, stomach - gastric paralysis, intestines - malabsorption, nerves - severe painful neuritis, circulation - gangrene & loss of a leg(s), and finally cancer and Alzheimers.    I recommend avoid fried & greasy foods,  sweets/candy, white rice (brown or wild rice or Quinoa is OK), white potatoes (sweet potatoes are OK) - anything made from white flour - bagels, doughnuts, rolls, buns, biscuits,white and wheat breads, pizza crust and traditional pasta made of white flour & egg white(vegetarian pasta or spinach or wheat pasta is OK).  Multi-grain bread is OK - like multi-grain flat bread or sandwich thins. Avoid alcohol in excess. Exercise is also important.    Eat all the vegetables you want - avoid meat, especially red meat and dairy - especially cheese.  Cheese is the most concentrated form of trans-fats which is the worst thing to clog up our arteries. Veggie cheese is OK   which can be found in the fresh produce section at [employer] or [employer] or [employer]  +++++++++++++++++++ DASH Eating Plan  DASH stands for "Dietary Approaches to Stop Hypertension."   The DASH eating plan is a healthy eating plan that has been shown to reduce high blood pressure (hypertension). Additional health benefits may include reducing the risk of type 2  diabetes mellitus, heart disease, and stroke. The DASH eating plan may also help with weight loss. WHAT DO I NEED TO KNOW ABOUT THE DASH EATING PLAN? For the DASH eating plan, you will follow these general guidelines: Choose foods with a percent daily value for sodium of less than 5% (as listed on the food label). Use salt-free seasonings or herbs instead of table salt or sea salt. Check with your health care provider or pharmacist before using salt substitutes. Eat lower-sodium products, often labeled as "lower sodium" or "no salt added." Eat fresh foods. Eat more vegetables, fruits, and low-fat dairy products. Choose whole grains. Look for the word "whole" as the first word in the ingredient list. Choose fish  Limit sweets, desserts, sugars, and sugary drinks. Choose heart-healthy fats. Eat veggie cheese  Eat more home-cooked food and less restaurant, buffet, and fast food. Limit fried foods. Cook foods using methods other than frying. Limit canned vegetables. If you do use them, rinse them well to decrease the sodium. When eating at a restaurant, ask that your food be prepared with less salt, or no salt if possible.                      WHAT FOODS CAN I EAT? Read Dr Joel Fuhrman's books on The End of Dieting & The End of Diabetes  Grains Whole grain or whole wheat bread. Brown rice. Whole grain or whole wheat pasta. Quinoa, bulgur, and whole grain cereals. Low-sodium cereals. Corn or whole wheat flour tortillas. Whole grain cornbread. Whole grain crackers. Low-sodium crackers.  Vegetables Fresh or frozen vegetables (raw, steamed, roasted, or grilled). Low-sodium or reduced-sodium tomato and vegetable juices. Low-sodium or reduced-sodium tomato sauce and paste. Low-sodium or reduced-sodium canned vegetables.   Fruits All fresh, canned (in natural juice), or frozen fruits.  Protein Products  All fish and seafood.  Dried beans, peas, or lentils. Unsalted nuts and seeds. Unsalted  canned beans.  Dairy Low-fat dairy products, such as skim or 1% milk, 2% or reduced-fat cheeses, low-fat ricotta or cottage cheese, or plain low-fat yogurt. Low-sodium or reduced-sodium cheeses.  Fats and Oils Tub margarines without trans fats. Light or reduced-fat mayonnaise and salad dressings (reduced sodium). Avocado. Safflower, olive, or canola oils. Natural peanut or almond butter.  Other Unsalted popcorn and pretzels. The items listed above may not be a complete list of recommended foods or beverages. Contact your dietitian for more options.  +++++++++++++++  WHAT FOODS ARE NOT RECOMMENDED? Grains/ White flour or wheat flour White bread. White pasta. White rice. Refined cornbread. Bagels and croissants. Crackers that contain trans fat.  Vegetables  Creamed or fried vegetables. Vegetables in a . Regular canned vegetables. Regular canned tomato sauce and paste. Regular tomato and vegetable juices.  Fruits Dried fruits. Canned fruit in light or heavy syrup. Fruit juice.  Meat and Other Protein Products Meat in general - RED meat & White meat.  Fatty cuts of meat. Ribs, chicken wings, all processed meats as bacon, sausage, bologna, salami, fatback, hot dogs, bratwurst and packaged luncheon meats.  Dairy Whole or 2% milk, cream, half-and-half, and cream cheese.   Whole-fat or sweetened yogurt. Full-fat cheeses or blue cheese. Non-dairy creamers and whipped toppings. Processed cheese, cheese spreads, or cheese curds.  Condiments Onion and garlic salt, seasoned salt, table salt, and sea salt. Canned and packaged gravies. Worcestershire sauce. Tartar sauce. Barbecue sauce. Teriyaki sauce. Soy sauce, including reduced sodium. Steak sauce. Fish sauce. Oyster sauce. Cocktail sauce. Horseradish. Ketchup and mustard. Meat flavorings and tenderizers. Bouillon cubes. Hot sauce. Tabasco sauce. Marinades. Taco seasonings. Relishes.  Fats and Oils Butter, stick margarine, lard, shortening and  bacon fat. Coconut, palm kernel, or palm oils. Regular salad dressings.  Pickles and olives. Salted popcorn and pretzels.  The items listed above may not be a complete list of foods and beverages to avoid.   

## 2022-10-26 NOTE — Progress Notes (Unsigned)
Annual Screening/Preventative Visit & Comprehensive Evaluation &  Examination  Future Appointments  Date Time Provider Department  10/27/2022                          cpe  2:00 PM Unk Pinto, MD [facility]  01/12/2023                            wellness 11:00 AM Darrol Jump, NP [facility]  10/28/2023                           cpe  2:00 PM Unk Pinto, MD [facility]          This very nice 71 y.o. DWF presents for a Screening /Preventative Visit & comprehensive evaluation and management of multiple medical co-morbidities.  Patient has been followed for HTN, HLD, Hypothyroidism, Prediabetes  and Vitamin D Deficiency.  Patient has GERD  controlled with diet & her meds.          Labile  HTN predates since  2007. Patient's BP has been controlled at home and patient denies any cardiac symptoms as chest pain, palpitations, shortness of breath, dizziness or ankle swelling. Today's BP is at goal -                        .        Patient's hyperlipidemia is controlled with diet and gCrestor.   Patient denies myalgias or other medication SE's. Last lipids were at goal :  Lab Results  Component Value Date   CHOL 146 01/08/2022   HDL 73 01/08/2022   LDLCALC 57 01/08/2022   TRIG 78 01/08/2022   CHOLHDL 2.0 01/08/2022         Patient has hx/o prediabetes (A1c 5.7% /2012) and patient denies reactive hypoglycemic symptoms, visual blurring, diabetic polys or paresthesias. Last A1c was normal & at goal :  Lab Results  Component Value Date   HGBA1C 5.4 09/30/2021         Finally, patient has history of Vitamin D Deficiency (A1c 5.7% /2012) and last Vitamin D was at goal :  Lab Results  Component Value Date   VD25OH 71 09/30/2021       Current Outpatient Medications  Medication Instructions   acyclovir (ZOVIRAX) 400 MG tablet Take  1 cap 3 x /day  w/Meals  &  2 caps  at Bedtime    famotidine  40 MG tablet TAKE 1 TABLET  AT BEDTIME   levothyroxine 50 MCG tablet  TAKE 1 TABLET  DAILY   mometasone (ELOCON) 0.1 % cream 1 Apply Topical, As needed   PRESERVISION AREDS 2  Daily   pantoprazole  40 MG tablet Take  1 tablet  Daily   rosuvastatin 20 MG tablet Take  1 tablet  every other Day    Vitamin D3 5,000 Units, Daily       Allergies  Allergen Reactions   Codeine Nausea And Vomiting and Other    Migraine     Past Medical History:  Diagnosis Date   Adenomatous colon polyp    Allergy    GERD (gastroesophageal reflux disease)    Heart murmur    history of as teenager    IBS (irritable bowel syndrome)    Labile hypertension    Macular degeneration of left eye  RLS (restless legs syndrome)    Thyroid disease    hypothyroid   Vitamin D deficiency      Health Maintenance  Topic Date Due   Zoster Vaccines- Shingrix (1 of 2) Never done   COVID-19 Vaccine (4 - Booster for Moderna series) 10/27/2020   INFLUENZA VACCINE  06/02/2021   MAMMOGRAM  04/02/2022   COLONOSCOPY  09/21/2022   TETANUS/TDAP  12/27/2028   Pneumonia Vaccine 66+ Years old  Completed   DEXA SCAN  Completed   Hepatitis C Screening  Completed   HPV VACCINES  Aged Out     Immunization History  Administered Date(s) Administered   Influenza, High Dose  08/01/2018, 07/07/2019   Influenza 07/29/2016, 09/01/2020   Moderna Sars-Covid-2 Vacc 11/29/2019, 12/27/2019, 09/01/2020   Pneumococcal -13 08/19/2015   Pneumococcal -23 11/09/2012, 08/01/2018   Td 12/27/2018   Tdap 10/18/2008     Colon - 09/21/2017 - Dr Henrene Pastor - recc 5 year f/u  Last Colon  - 09/28/2022  - Dr Henrene Pastor recc 5 year f/u due Dec 2028   Last MGM - 04/08/2022 - Negative    Past Surgical History:  Procedure Laterality Date   COLONOSCOPY     POLYPECTOMY     TENDON RECONSTRUCTION Right 04/27/2013   Procedure: RIGHT ELBOW DEBRIDE/REPAIR/EXPLORE - LATERAL HUMERAL EPICONDYLE;  Surgeon: Ninetta Lights, MD;  Location: [facility];  Service: Orthopedics;  Laterality: Right;   TUBAL LIGATION        Family History  Problem Relation Age of Onset   Hypertension Father    Cerebral aneurysm Father    Alzheimer's disease Mother    Stomach cancer Paternal Grandmother 23   Colon cancer Neg Hx    Esophageal cancer Neg Hx    Rectal cancer Neg Hx      Social History   Tobacco Use   Smoking status: Former    Packs/day: 2.00    Years: 14.00    Pack years: 28.00    Types: Cigarettes    Start date: 11/02/1966    Quit date: 04/24/1981    Years since quitting: 40.4   Smokeless tobacco: Never  Vaping Use   Vaping Use: Never used  Substance Use Topics   Alcohol use: Yes    Alcohol/week: 3.0 standard drinks    Types: 3 Glasses of wine per week    Comment: occ   Drug use: No      ROS Constitutional: Denies fever, chills, weight loss/gain, headaches, insomnia,  night sweats, and change in appetite. Does c/o fatigue. Eyes: Denies redness, blurred vision, diplopia, discharge, itchy, watery eyes.  ENT: Denies discharge, congestion, post nasal drip, epistaxis, sore throat, earache, hearing loss, dental pain, Tinnitus, Vertigo, Sinus pain, snoring.  Cardio: Denies chest pain, palpitations, irregular heartbeat, syncope, dyspnea, diaphoresis, orthopnea, PND, claudication, edema Respiratory: denies cough, dyspnea, DOE, pleurisy, hoarseness, laryngitis, wheezing.  Gastrointestinal: Denies dysphagia, heartburn, reflux, water brash, pain, cramps, nausea, vomiting, bloating, diarrhea, constipation, hematemesis, melena, hematochezia, jaundice, hemorrhoids Genitourinary: Denies dysuria, frequency, urgency, nocturia, hesitancy, discharge, hematuria, flank pain Breast: Breast lumps, nipple discharge, bleeding.  Musculoskeletal: Denies arthralgia, myalgia, stiffness, Jt. Swelling, pain, limp, and strain/sprain. Denies falls. Skin: Denies puritis, rash, hives, warts, acne, eczema, changing in skin lesion Neuro: No weakness, tremor, incoordination, spasms, paresthesia, pain Psychiatric: Denies  confusion, memory loss, sensory loss. Denies Depression. Endocrine: Denies change in weight, skin, hair change, nocturia, and paresthesia, diabetic polys, visual blurring, hyper / hypo glycemic episodes.  Heme/Lymph: No excessive bleeding, bruising, enlarged  lymph nodes.  Physical Exam  There were no vitals taken for this visit.  General Appearance: Well nourished, well groomed and in no apparent distress.  Eyes: PERRLA, EOMs, conjunctiva no swelling or erythema, normal fundi and vessels. Sinuses: No frontal/maxillary tenderness ENT/Mouth: EACs patent / TMs  nl. Nares clear without erythema, swelling, mucoid exudates. Oral hygiene is good. No erythema, swelling, or exudate. Tongue normal, non-obstructing. Tonsils not swollen or erythematous. Hearing normal.  Neck: Supple, thyroid not palpable. No bruits, nodes or JVD. Respiratory: Respiratory effort normal.  BS equal and clear bilateral without rales, rhonci, wheezing or stridor. Cardio: Heart sounds are normal with regular rate and rhythm and no murmurs, rubs or gallops. Peripheral pulses are normal and equal bilaterally without edema. No aortic or femoral bruits. Chest: symmetric with normal excursions and percussion. Breasts: Symmetric, without lumps, nipple discharge, retractions, or fibrocystic changes.  Abdomen: Flat, soft with bowel sounds active. Nontender, no guarding, rebound, hernias, masses, or organomegaly.  Lymphatics: Non tender without lymphadenopathy.  Genitourinary:  Musculoskeletal: Full ROM all peripheral extremities, joint stability, 5/5 strength, and normal gait. Skin: Warm and dry without rashes, lesions, cyanosis, clubbing or  ecchymosis.  Neuro: Cranial nerves intact, reflexes equal bilaterally. Normal muscle tone, no cerebellar symptoms. Sensation intact.  Pysch: Alert and oriented X 3, normal affect, Insight and Judgment appropriate.    Assessment and Plan  1. Annual Preventative Screening Examination  2.  Labile hypertension  - EKG 12-Lead - Urinalysis, Routine w reflex microscopic - Urine Culture - Microalbumin / creatinine urine ratio - CBC with Differential/Platelet - COMPLETE METABOLIC PANEL WITH GFR - Magnesium - TSH  3. Hyperlipidemia, mixed  - EKG 12-Lead - Lipid panel - TSH  4. Abnormal glucose  - EKG 12-Lead - Hemoglobin A1c - Insulin, random  5. Vitamin D deficiency  - VITAMIN D 25 Hydroxy   6. Hypothyroidism, unspecified type  - TSH  7. Gastroesophageal reflux disease  - CBC with Differential/Platelet  8. Encounter for colorectal cancer screening  - POC Hemoccult Bld/Stl  9. Screening for ischemic heart disease  - EKG 12-Lead  10. FH: hypertension  - EKG 12-Lead  11. Former smoker  - EKG 12-Lead  12. Medication management  - Urinalysis, Routine w reflex microscopic - Urine Culture - CBC with Differential/Platelet - COMPLETE METABOLIC PANEL WITH GFR - Magnesium - Lipid panel - TSH - Hemoglobin A1c - Insulin, random - VITAMIN D 25 Hydroxy           Patient was counseled in prudent diet to achieve/maintain BMI less than 25 for weight control, BP monitoring, regular exercise and medications. Discussed med's effects and SE's. Screening labs and tests as requested with regular follow-up as recommended. Over 40 minutes of exam, counseling, chart review and high complex critical decision making was performed.   Kirtland Bouchard, MD

## 2022-10-27 ENCOUNTER — Encounter: Payer: Self-pay | Admitting: Internal Medicine

## 2022-10-27 ENCOUNTER — Ambulatory Visit (INDEPENDENT_AMBULATORY_CARE_PROVIDER_SITE_OTHER): Payer: PPO | Admitting: Internal Medicine

## 2022-10-27 VITALS — BP 164/82 | HR 85 | Temp 97.3°F | Resp 16 | Ht 63.0 in | Wt 152.2 lb

## 2022-10-27 DIAGNOSIS — Z0001 Encounter for general adult medical examination with abnormal findings: Secondary | ICD-10-CM

## 2022-10-27 DIAGNOSIS — Z87891 Personal history of nicotine dependence: Secondary | ICD-10-CM

## 2022-10-27 DIAGNOSIS — R7309 Other abnormal glucose: Secondary | ICD-10-CM | POA: Diagnosis not present

## 2022-10-27 DIAGNOSIS — R0989 Other specified symptoms and signs involving the circulatory and respiratory systems: Secondary | ICD-10-CM | POA: Diagnosis not present

## 2022-10-27 DIAGNOSIS — K219 Gastro-esophageal reflux disease without esophagitis: Secondary | ICD-10-CM

## 2022-10-27 DIAGNOSIS — Z0000 Encounter for general adult medical examination without abnormal findings: Secondary | ICD-10-CM

## 2022-10-27 DIAGNOSIS — E782 Mixed hyperlipidemia: Secondary | ICD-10-CM

## 2022-10-27 DIAGNOSIS — E039 Hypothyroidism, unspecified: Secondary | ICD-10-CM | POA: Diagnosis not present

## 2022-10-27 DIAGNOSIS — Z8249 Family history of ischemic heart disease and other diseases of the circulatory system: Secondary | ICD-10-CM

## 2022-10-27 DIAGNOSIS — E559 Vitamin D deficiency, unspecified: Secondary | ICD-10-CM

## 2022-10-27 DIAGNOSIS — Z136 Encounter for screening for cardiovascular disorders: Secondary | ICD-10-CM

## 2022-10-27 DIAGNOSIS — Z79899 Other long term (current) drug therapy: Secondary | ICD-10-CM | POA: Diagnosis not present

## 2022-10-28 ENCOUNTER — Other Ambulatory Visit: Payer: Self-pay | Admitting: Internal Medicine

## 2022-10-28 LAB — CBC WITH DIFFERENTIAL/PLATELET
Absolute Monocytes: 401 cells/uL (ref 200–950)
Basophils Absolute: 27 cells/uL (ref 0–200)
Basophils Relative: 0.4 %
Eosinophils Absolute: 61 cells/uL (ref 15–500)
Eosinophils Relative: 0.9 %
HCT: 38.6 % (ref 35.0–45.0)
Hemoglobin: 13.2 g/dL (ref 11.7–15.5)
Lymphs Abs: 2360 cells/uL (ref 850–3900)
MCH: 31 pg (ref 27.0–33.0)
MCHC: 34.2 g/dL (ref 32.0–36.0)
MCV: 90.6 fL (ref 80.0–100.0)
MPV: 9.3 fL (ref 7.5–12.5)
Monocytes Relative: 5.9 %
Neutro Abs: 3951 cells/uL (ref 1500–7800)
Neutrophils Relative %: 58.1 %
Platelets: 333 10*3/uL (ref 140–400)
RBC: 4.26 10*6/uL (ref 3.80–5.10)
RDW: 12.2 % (ref 11.0–15.0)
Total Lymphocyte: 34.7 %
WBC: 6.8 10*3/uL (ref 3.8–10.8)

## 2022-10-28 LAB — COMPLETE METABOLIC PANEL WITH GFR
AG Ratio: 1.8 (calc) (ref 1.0–2.5)
ALT: 14 U/L (ref 6–29)
AST: 20 U/L (ref 10–35)
Albumin: 4.8 g/dL (ref 3.6–5.1)
Alkaline phosphatase (APISO): 40 U/L (ref 37–153)
BUN: 16 mg/dL (ref 7–25)
CO2: 29 mmol/L (ref 20–32)
Calcium: 10.3 mg/dL (ref 8.6–10.4)
Chloride: 103 mmol/L (ref 98–110)
Creat: 0.8 mg/dL (ref 0.60–1.00)
Globulin: 2.7 g/dL (calc) (ref 1.9–3.7)
Glucose, Bld: 92 mg/dL (ref 65–99)
Potassium: 4.1 mmol/L (ref 3.5–5.3)
Sodium: 142 mmol/L (ref 135–146)
Total Bilirubin: 0.4 mg/dL (ref 0.2–1.2)
Total Protein: 7.5 g/dL (ref 6.1–8.1)
eGFR: 78 mL/min/{1.73_m2} (ref >=60)

## 2022-10-28 LAB — LIPID PANEL
Cholesterol: 156 mg/dL (ref <200)
HDL: 68 mg/dL (ref >=50)
LDL Cholesterol (Calc): 62 mg/dL (calc)
Non-HDL Cholesterol (Calc): 88 mg/dL (calc) (ref <130)
Total CHOL/HDL Ratio: 2.3 (calc) (ref <5.0)
Triglycerides: 193 mg/dL — ABNORMAL HIGH (ref <150)

## 2022-10-28 LAB — URINALYSIS, ROUTINE W REFLEX MICROSCOPIC
Bacteria, UA: NONE SEEN /HPF
Bilirubin Urine: NEGATIVE
Glucose, UA: NEGATIVE
Hyaline Cast: NONE SEEN /LPF
Ketones, ur: NEGATIVE
Leukocytes,Ua: NEGATIVE
Nitrite: NEGATIVE
Protein, ur: NEGATIVE
RBC / HPF: NONE SEEN /HPF (ref 0–2)
Specific Gravity, Urine: 1.009 (ref 1.001–1.035)
Squamous Epithelial / HPF: NONE SEEN /HPF (ref <=5)
WBC, UA: NONE SEEN /HPF (ref 0–5)
pH: 6 (ref 5.0–8.0)

## 2022-10-28 LAB — HEMOGLOBIN A1C
Hgb A1c MFr Bld: 5.6 % of total Hgb (ref <5.7)
Mean Plasma Glucose: 114 mg/dL
eAG (mmol/L): 6.3 mmol/L

## 2022-10-28 LAB — MAGNESIUM: Magnesium: 1.8 mg/dL (ref 1.5–2.5)

## 2022-10-28 LAB — MICROALBUMIN / CREATININE URINE RATIO
Creatinine, Urine: 52 mg/dL (ref 20–275)
Microalb Creat Ratio: 6 mcg/mg creat (ref <30)
Microalb, Ur: 0.3 mg/dL

## 2022-10-28 LAB — INSULIN, RANDOM: Insulin: 8 u[IU]/mL

## 2022-10-28 LAB — VITAMIN D 25 HYDROXY (VIT D DEFICIENCY, FRACTURES): Vit D, 25-Hydroxy: 64 ng/mL (ref 30–100)

## 2022-10-28 LAB — TSH: TSH: 2.43 mIU/L (ref 0.40–4.50)

## 2022-10-28 LAB — MICROSCOPIC MESSAGE

## 2022-10-28 NOTE — Progress Notes (Signed)
<><><><><><><><><><><><><><><><><><><><><><><><><><><><><><><><><> <><><><><><><><><><><><><><><><><><><><><><><><><><><><><><><><><>  -   Total Chol = 156    &   LCL Chol = 62   - Both    Excellent   - Very low risk for Heart Attack  / Stroke <><><><><><><><><><><><><><><><><><><><><><><><><><><><><><><><><> <><><><><><><><><><><><><><><><><><><><><><><><><><><><><><><><><>  -   Magnesium =  1.8    is   Very   low- goal is betw 2.0 - 2.5,   - So......Marland Kitchen  Recommend that you take a Magnesium 500 mg tablet   2 x /day with Meals   - also important to eat lots of  leafy green vegetables   - spinach - Kale - collards - greens - okra - asparagus  - broccoli - quinoa - squash - almonds   - black, red, white beans  -  peas - green beans <><><><><><><><><><><><><><><><><><><><><><><><><><><><><><><><><> <><><><><><><><><><><><><><><><><><><><><><><><><><><><><><><><><>  -  A1c - Normal - No Diabetes  - Great ! <><><><><><><><><><><><><><><><><><><><><><><><><><><><><><><><><> <><><><><><><><><><><><><><><><><><><><><><><><><><><><><><><><><>  -  Vitamin D = 64 - Great  - Please keep dose same  <><><><><><><><><><><><><><><><><><><><><><><><><><><><><><><><><> <><><><><><><><><><><><><><><><><><><><><><><><><><><><><><><><><>  -  All Else - CBC - Kidneys - Electrolytes - Liver - Magnesium & Thyroid    - all  Normal / OK <><><><><><><><><><><><><><><><><><><><><><><><><><><><><><><><><> <><><><><><><><><><><><><><><><><><><><><><><><><><><><><><><><><>  -  Keep up the Good Work  !  <><><><><><><><><><><><><><><><><><><><><><><><><><><><><><><><><> <><><><><><><><><><><><><><><><><><><><><><><><><><><><><><><><><>

## 2022-10-29 ENCOUNTER — Other Ambulatory Visit: Payer: Self-pay | Admitting: Internal Medicine

## 2022-10-29 DIAGNOSIS — I10 Essential (primary) hypertension: Secondary | ICD-10-CM

## 2022-10-29 DIAGNOSIS — G8929 Other chronic pain: Secondary | ICD-10-CM

## 2022-10-29 MED ORDER — OLMESARTAN MEDOXOMIL 20 MG PO TABS: ORAL_TABLET | ORAL | 3 refills | Status: DC

## 2022-10-29 NOTE — Progress Notes (Signed)
Patient is aware of lab results and instructions. -e welch

## 2022-11-19 NOTE — Progress Notes (Signed)
     Future Appointments  Date Time Provider Department  11/20/2022 11:00 AM Unk Pinto, MD [facility]  [facility]  01/26/2023 10:30 AM Darrol Jump, NP [facility]  04/28/2023 11:30 AM Unk Pinto, MD [facility]  10/28/2023  2:00 PM Unk Pinto, MD [facility]    History of Present Illness:       Patient is a very nice 72 yo DWF  with HTN, HLD, Hypothyroidism returning for 3 week f/u of elevated BP  162/84 & she was started on Olmesartan 20 mg. Since then BPs have been labile from Normal to occasionally elevated to 150-160 range sys.    Allergies  Allergen Reactions   Codeine Nausea And Vomiting and Other (See Comments)    Migraine    Problem list She has GERD (gastroesophageal reflux disease); Macular degeneration of left eye; IBS (irritable bowel syndrome); RLS (restless legs syndrome); Labile hypertension; Vitamin D deficiency; Hyperlipidemia, mixed; Abnormal glucose; Hypothyroidism; Medication management; Osteopenia; and Former smoker (28 pack year history, quit 1982) on their problem list.   Observations/Objective:  BP (!) 148/70   Pulse 89   Temp 97.9 F (36.6 C)   Resp 16   Ht '5\' 3"'$  (1.6 m)   Wt 149 lb 12.8 oz (67.9 kg)   SpO2 97%   BMI 26.54 kg/m   HEENT - WNL. Neck - supple.  Chest - Clear equal BS. Cor - Nl HS. RRR w/o sig MGR. PP 1(+). No edema. MS- FROM w/o deformities.  Gait Nl. Neuro -  Nl w/o focal abnormalities.   Assessment and Plan:   1. Labile hypertension   2. Essential hypertension  Add  - bisoprolol (ZEBETA) 10 MG tablet;  Take  1/2 to 1 tablet  every  Morning  for BP   Dispense: 90 tablet; Refill: 1   Follow Up Instructions:        I discussed the assessment and treatment plan with the patient. The patient was provided an opportunity to ask questions and all were answered. The patient agreed with the plan and demonstrated an understanding of the instructions.       The patient was advised to call back or seek an  in-person evaluation if the symptoms worsen or if the condition fails to improve as anticipated.    Kirtland Bouchard, MD

## 2022-11-20 ENCOUNTER — Encounter: Payer: Self-pay | Admitting: Internal Medicine

## 2022-11-20 ENCOUNTER — Ambulatory Visit (INDEPENDENT_AMBULATORY_CARE_PROVIDER_SITE_OTHER): Payer: PPO | Admitting: Internal Medicine

## 2022-11-20 VITALS — BP 148/70 | HR 89 | Temp 97.9°F | Resp 16 | Ht 63.0 in | Wt 149.8 lb

## 2022-11-20 DIAGNOSIS — R0989 Other specified symptoms and signs involving the circulatory and respiratory systems: Secondary | ICD-10-CM | POA: Diagnosis not present

## 2022-11-20 DIAGNOSIS — I10 Essential (primary) hypertension: Secondary | ICD-10-CM | POA: Diagnosis not present

## 2022-11-20 MED ORDER — BISOPROLOL FUMARATE 10 MG PO TABS: ORAL_TABLET | ORAL | 1 refills | Status: DC

## 2022-11-30 ENCOUNTER — Ambulatory Visit: Payer: PPO | Admitting: Internal Medicine

## 2022-12-28 ENCOUNTER — Other Ambulatory Visit: Payer: Self-pay | Admitting: Internal Medicine

## 2022-12-28 DIAGNOSIS — I10 Essential (primary) hypertension: Secondary | ICD-10-CM

## 2022-12-28 MED ORDER — OLMESARTAN MEDOXOMIL 40 MG PO TABS: ORAL_TABLET | ORAL | 3 refills | Status: DC

## 2022-12-28 MED ORDER — HYDROCHLOROTHIAZIDE 25 MG PO TABS: ORAL_TABLET | ORAL | 3 refills | Status: DC

## 2023-01-12 ENCOUNTER — Ambulatory Visit: Payer: PPO | Admitting: Nurse Practitioner

## 2023-01-17 ENCOUNTER — Other Ambulatory Visit: Payer: Self-pay | Admitting: Internal Medicine

## 2023-01-17 DIAGNOSIS — I10 Essential (primary) hypertension: Secondary | ICD-10-CM

## 2023-01-17 MED ORDER — OLMESARTAN MEDOXOMIL 20 MG PO TABS: ORAL_TABLET | ORAL | 3 refills | Status: DC

## 2023-01-18 ENCOUNTER — Other Ambulatory Visit: Payer: Self-pay | Admitting: Internal Medicine

## 2023-01-18 DIAGNOSIS — E782 Mixed hyperlipidemia: Secondary | ICD-10-CM

## 2023-01-18 DIAGNOSIS — I10 Essential (primary) hypertension: Secondary | ICD-10-CM

## 2023-01-18 MED ORDER — ROSUVASTATIN CALCIUM 40 MG PO TABS: ORAL_TABLET | ORAL | 3 refills | Status: DC

## 2023-01-18 MED ORDER — OLMESARTAN MEDOXOMIL 40 MG PO TABS: ORAL_TABLET | ORAL | 3 refills | Status: DC

## 2023-01-26 ENCOUNTER — Encounter: Payer: Self-pay | Admitting: Nurse Practitioner

## 2023-01-26 ENCOUNTER — Ambulatory Visit (INDEPENDENT_AMBULATORY_CARE_PROVIDER_SITE_OTHER): Payer: PPO | Admitting: Nurse Practitioner

## 2023-01-26 VITALS — BP 122/72 | HR 58 | Temp 98.0°F | Ht 63.0 in | Wt 149.0 lb

## 2023-01-26 DIAGNOSIS — R6889 Other general symptoms and signs: Secondary | ICD-10-CM | POA: Diagnosis not present

## 2023-01-26 DIAGNOSIS — R0989 Other specified symptoms and signs involving the circulatory and respiratory systems: Secondary | ICD-10-CM | POA: Diagnosis not present

## 2023-01-26 DIAGNOSIS — R7309 Other abnormal glucose: Secondary | ICD-10-CM

## 2023-01-26 DIAGNOSIS — H3530 Unspecified macular degeneration: Secondary | ICD-10-CM | POA: Diagnosis not present

## 2023-01-26 DIAGNOSIS — Z0001 Encounter for general adult medical examination with abnormal findings: Secondary | ICD-10-CM | POA: Diagnosis not present

## 2023-01-26 DIAGNOSIS — Z87891 Personal history of nicotine dependence: Secondary | ICD-10-CM

## 2023-01-26 DIAGNOSIS — G2581 Restless legs syndrome: Secondary | ICD-10-CM | POA: Diagnosis not present

## 2023-01-26 DIAGNOSIS — Z79899 Other long term (current) drug therapy: Secondary | ICD-10-CM | POA: Diagnosis not present

## 2023-01-26 DIAGNOSIS — K589 Irritable bowel syndrome without diarrhea: Secondary | ICD-10-CM | POA: Diagnosis not present

## 2023-01-26 DIAGNOSIS — M8589 Other specified disorders of bone density and structure, multiple sites: Secondary | ICD-10-CM

## 2023-01-26 DIAGNOSIS — K219 Gastro-esophageal reflux disease without esophagitis: Secondary | ICD-10-CM

## 2023-01-26 DIAGNOSIS — E559 Vitamin D deficiency, unspecified: Secondary | ICD-10-CM

## 2023-01-26 DIAGNOSIS — Z0000 Encounter for general adult medical examination without abnormal findings: Secondary | ICD-10-CM

## 2023-01-26 DIAGNOSIS — E782 Mixed hyperlipidemia: Secondary | ICD-10-CM

## 2023-01-26 DIAGNOSIS — E039 Hypothyroidism, unspecified: Secondary | ICD-10-CM

## 2023-01-26 NOTE — Progress Notes (Signed)
MEDICARE ANNUAL WELLNESS VISIT AND FOLLOW UP  Assessment:   Diagnoses and all orders for this visit:  Annual Medicare Wellness Visit Due annually  Health maintenance reviewed  Gastroesophageal reflux disease, esophagitis presence not specified No suspected reflux complications (Barret/stricture). Lifestyle modification:  wt loss, avoid meals 2-3h before bedtime. Consider eliminating food triggers:  chocolate, caffeine, EtOH, acid/spicy food.  Irritable bowel syndrome, unspecified type Controlled Reduce stress Avoid triggers  Hypothyroidism, unspecified type Controlled. Continue Levothyroxine. Reminded to take on an empty stomach 30-85mins before food.  Stop any Biotin Supplement 48-72 hours before next TSH level to reduce the risk of falsely low TSH levels. Continue to monitor.     Macular degeneration of left eye, unspecified type Managed at [facility]  Medication management All medications discussed and reviewed in full. All questions and concerns regarding medications addressed.    Mixed hyperlipidemia Continue Rosuvastatin Discussed lifestyle modifications. Recommended diet heavy in fruits and veggies, omega 3's. Decrease consumption of animal meats, cheeses, and dairy products. Remain active and exercise as tolerated. Continue to monitor. Check lipids/TSH  RLS (restless legs syndrome) Controlled Push fluids Continue OTC topical cream   Vitamin D deficiency Continue supplement for goal of 60-100 Monitor levels  Other abnormal glucose Education: Reviewed 'ABCs' of diabetes management  Discussed goals to be met and/or maintained include A1C (<7) Blood pressure (<130/80) Cholesterol (LDL <70) Continue Eye Exam yearly  Continue Dental Exam Q6 mo Discussed dietary recommendations Discussed Physical Activity recommendations Check A1C  Former smoker (28 pack year history, quit 1982) Denies concerning sx of lung cancer; Criteria not met for low dose  lung cancer screening Had normal CXR 03/27/2020  Osteopenia Due 2024 Dr. Nori Riis following -1.8 T Score 05/2021 Pursue a combination of weight-bearing exercises and strength training. Advised on fall prevention measures including proper lighting in all rooms, removal of area rugs and floor clutter, use of walking devices as deemed appropriate, avoidance of uneven walking surfaces. Smoking cessation and moderate alcohol consumption if applicable Consume [id number] to 1000 IU of vitamin D daily with a goal vitamin D serum value of 30 ng/mL or higher. Aim for 1000 to 1200 mg of elemental calcium daily through supplements and/or dietary sources.  Labile hypertension Controlled Continue Bisoprolol, HCTZ, Olmesartan Discussed DASH (Dietary Approaches to Stop Hypertension) DASH diet is lower in sodium than a typical American diet. Cut back on foods that are high in saturated fat, cholesterol, and trans fats. Eat more whole-grain foods, fish, poultry, and nuts Remain active and exercise as tolerated daily.  Monitor BP at home-Call if greater than 130/80.  Check CMP/CBC  Orders Placed This Encounter  Procedures   CBC with Differential/Platelet   COMPLETE METABOLIC PANEL WITH GFR   Lipid panel   TSH   Hemoglobin A1c   VITAMIN D 25 Hydroxy (Vit-D Deficiency, Fractures)    Notify office for further evaluation and treatment, questions or concerns if any reported s/s fail to improve.   The patient was advised to call back or seek an in-person evaluation if any symptoms worsen or if the condition fails to improve as anticipated.   Further disposition pending results of labs. Discussed med's effects and SE's.    I discussed the assessment and treatment plan with the patient. The patient was provided an opportunity to ask questions and all were answered. The patient agreed with the plan and demonstrated an understanding of the instructions.  Discussed med's effects and SE's. Screening labs and tests as  requested with regular follow-up as  recommended.  I provided 35 minutes of face-to-face time during this encounter including counseling, chart review, and critical decision making was preformed.  Today's Plan of Care is based on a patient-centered health care approach known as shared decision making - the decisions, tests and treatments allow for patient preferences and values to be balanced with clinical evidence.    Future Appointments  Date Time Provider [place]  04/28/2023 11:30 AM Unk Pinto, MD [facility] None  10/28/2023  2:00 PM Unk Pinto, MD [facility] None  01/28/2024 11:00 AM Darrol Jump, NP [facility] None     Plan:   During the course of the visit the patient was educated and counseled about appropriate screening and preventive services including:   Pneumococcal vaccine  Prevnar 13 Influenza vaccine Td vaccine Screening electrocardiogram Bone densitometry screening Colorectal cancer screening Diabetes screening Glaucoma screening Nutrition counseling  Advanced directives: requested   Subjective:  Sarah Hartman is a 72 y.o. female who presents for Medicare Annual Wellness Visit and 3 month follow up. She has GERD (gastroesophageal reflux disease); Macular degeneration of left eye; IBS (irritable bowel syndrome); RLS (restless legs syndrome); Labile hypertension; Vitamin D deficiency; Hyperlipidemia, mixed; Abnormal glucose; Hypothyroidism; Medication management; Osteopenia; and Former smoker (28 pack year history, quit 1982) on their problem list.  Overall she reports feeling well today.  She is followed by Dr. Nori Riis (GYN) for hx of frequent UTIs, reports, questionable Josph Macho syndrome dx but cannot afford the procedure, symptoms have been stable eating more cranberries/juice and stopped running.  Dr. Nori Riis also following bone densities for osteoporosis.   She has dx of RLS, reports has OTC topical that continues to work well for her.  Denies sx in recent months.   BMI is Body mass index is 26.39 kg/m., she has been working on diet and exercise.  Wt Readings from Last 3 Encounters:  01/26/23 149 lb (67.6 kg)  11/20/22 149 lb 12.8 oz (67.9 kg)  10/27/22 152 lb 3.2 oz (69 kg)   she has a diagnosis of GERD which is currently managed by pantoprazole 40 mg daily, famotidine 40 mg, still has some breakthrough occasionally which she correlates to coffee.  she reports symptoms is currently well controlled, and denies breakthrough reflux, burning in chest, hoarseness or cough.    She is a former smoker, 28 pack year quit in 1982. Denies symptoms other than annual bronchitis ~2 weeks around Christmas. Didn't have last year during the pandemic, wonders if mask vs social distancing helped.   She does have BP cuff at home, checks and typically runs 120-130s/70s, prone to elevation with initial check in office. Today their BP is BP: 122/72. She reports some stress related to roof leaking on her new home.  She does workout. She denies chest pain, shortness of breath, dizziness.   She is on cholesterol medication (on rosuvastatin 20 mg every other day) and denies myalgias. Her cholesterol is not at goal. The cholesterol last visit was:   Lab Results  Component Value Date   CHOL 156 10/27/2022   HDL 68 10/27/2022   LDLCALC 62 10/27/2022   TRIG 193 (H) 10/27/2022   CHOLHDL 2.3 10/27/2022    Last A1C in the office was:  Lab Results  Component Value Date   HGBA1C 5.6 10/27/2022   Last GFR: Lab Results  Component Value Date   GFRNONAA 68 04/03/2021   Patient is on Vitamin D supplement and near goal at last check  Lab Results  Component Value Date  VD25OH 64 10/27/2022     She is on thyroid medication. Her medication was not changed last visit. Takes 50 mcg daily.   Lab Results  Component Value Date   TSH 2.43 10/27/2022    Medication Review: Current Outpatient Medications on File Prior to Visit  Medication Sig Dispense  Refill   bisoprolol (ZEBETA) 10 MG tablet Take  1/2 to 1 tablet  every  Morning  for BP 90 tablet 1   Cholecalciferol (VITAMIN D3) 5000 units TABS Take 5,000 Units by mouth daily.     hydrochlorothiazide (HYDRODIURIL) 25 MG tablet Take  1 tablet  Daily  for BP and Fluid 90 tablet 3   levothyroxine (SYNTHROID) 50 MCG tablet TAKE 1 TABLET BY MOUTH ONCE DAILY ON AN EMPTY STOMACH FOR 30MINUTES AND NO ANTACID MEDS, CALCIUM, OR MAGNESIUM FOR 4 HOURS, AND AVOID BIOTIN 90 tablet 0   Magnesium 500 MG TABS Take by mouth 2 (two) times daily.     mometasone (ELOCON) 0.1 % cream Apply 1 Application topically as needed.     Multiple Vitamins-Minerals (PRESERVISION AREDS 2 PO) daily.     olmesartan (BENICAR) 40 MG tablet Take  1 tablet  Daily  for BP 90 tablet 3   rosuvastatin (CRESTOR) 40 MG tablet Take  1 tablet   every other day  for Cholesterol 46 tablet 3   No current facility-administered medications on file prior to visit.    Allergies  Allergen Reactions   Codeine Nausea And Vomiting and Other (See Comments)    Migraine    Current Problems (verified) Patient Active Problem List   Diagnosis Date Noted   Former smoker (28 pack year history, quit 1982) 03/19/2020   Osteopenia 12/28/2017   Hyperlipidemia, mixed 02/07/2015   Abnormal glucose 02/07/2015   Hypothyroidism 02/07/2015   Medication management 02/07/2015   Vitamin D deficiency 11/15/2013   GERD (gastroesophageal reflux disease)    Macular degeneration of left eye    IBS (irritable bowel syndrome)    RLS (restless legs syndrome)    Labile hypertension     Screening Tests Immunization History  Administered Date(s) Administered   Fluad Quad(high Dose 65+) 11/06/2021   Influenza, High Dose Seasonal PF 08/01/2018, 07/07/2019, 09/30/2022   Influenza-Unspecified 07/29/2016, 09/01/2020   Moderna SARS-COV2 Booster Vaccination 09/10/2021   Moderna Sars-Covid-2 Vaccination 11/29/2019, 12/27/2019, 09/01/2020   Pneumococcal Conjugate-13  08/19/2015   Pneumococcal Polysaccharide-23 11/09/2012, 08/01/2018   Respiratory Syncytial Virus Vaccine,Recomb Aduvanted(Arexvy) 07/30/2022   Td 12/27/2018   Tdap 10/18/2008   Zoster, Live 07/03/2014   Health Maintenance  Topic Date Due   COVID-19 Vaccine (4 - 2023-24 season) 07/03/2022   MAMMOGRAM  04/09/2023   Medicare Annual Wellness (AWV)  01/26/2024   DEXA SCAN  05/22/2026   COLONOSCOPY (Pts 45-42yrs Insurance coverage will need to be confirmed)  09/29/2027   DTaP/Tdap/Td (3 - Td or Tdap) 12/27/2028   Pneumonia Vaccine 43+ Years old  Completed   INFLUENZA VACCINE  Completed   Hepatitis C Screening  Completed   HPV VACCINES  Aged Out   Zoster Vaccines- Shingrix  Discontinued   Preventative care: Last colonoscopy: 09/2022 - 5 year recall  Last mammogram: 04/2022 Last pap smear/pelvic exam: PAP 09/2020 by Dr. Nori Riis  - has scheduled 01/2022 DEXA: 05/2021 Dr. Nori Riis following - Due  Shingles/Zostavax: had 1 shot of shingrix at pharmacy - planning to get second  Covid 19 vaccine record requested for booster reported 08/2021  Names of Other Physician/Practitioners you currently use: 1. [facility] here for primary care 2. [facility], eye doctor, last visit 2023 3. [facility] in [place], Pharmacist, community, last visit 2023  Patient Care Team: Unk Pinto, MD as PCP - General (Internal Medicine) Ninetta Lights, MD (Inactive) as Consulting Physician (Orthopedic Surgery) Irene Shipper, MD as Consulting Physician (Gastroenterology) Festus Aloe, MD as Consulting Physician (Urology) Maisie Fus, MD (Inactive) as Consulting Physician (Obstetrics and Gynecology)  SURGICAL HISTORY She  has a past surgical history that includes Colonoscopy; Tubal ligation; Tendon reconstruction (Right, 04/27/2013); and Polypectomy. FAMILY HISTORY Her family history includes Alzheimer's disease in her mother; Cerebral aneurysm in her father; Hypertension in her  father; Stomach cancer (age of onset: 33) in her paternal grandmother. SOCIAL HISTORY She  reports that she quit smoking about 41 years ago. Her smoking use included cigarettes. She started smoking about 56 years ago. She has a 28.00 pack-year smoking history. She has never used smokeless tobacco. She reports current alcohol use of about 3.0 standard drinks of alcohol per week. She reports that she does not use drugs.   MEDICARE WELLNESS OBJECTIVES: Physical activity:   Cardiac risk factors:   Depression/mood screen:      01/26/2023   11:19 AM  Depression screen PHQ 2/9  Decreased Interest 0  Down, Depressed, Hopeless 0  PHQ - 2 Score 0    ADLs:     01/26/2023   11:17 AM 10/26/2022   11:23 PM  In your present state of health, do you have any difficulty performing the following activities:  Hearing? 0 0  Vision? 0 0  Difficulty concentrating or making decisions? 0 0  Walking or climbing stairs? 0 0  Dressing or bathing? 0 0  Doing errands, shopping? 0 0  Preparing Food and eating ? N   Using the Toilet? N   In the past six months, have you accidently leaked urine? N   Do you have problems with loss of bowel control? N   Managing your Medications? N   Managing your Finances? N   Housekeeping or managing your Housekeeping? N      Cognitive Testing  Alert? Yes  Normal Appearance?Yes  Oriented to person? Yes  Place? Yes   Time? Yes  Recall of three objects?  Yes  Can perform simple calculations? Yes  Displays appropriate judgment?Yes  Can read the correct time from a watch face?Yes  EOL planning:    Review of Systems  Constitutional:  Negative for malaise/fatigue and weight loss.  HENT:  Negative for hearing loss and tinnitus.   Eyes:  Negative for blurred vision and double vision.  Respiratory:  Negative for cough, shortness of breath and wheezing.   Cardiovascular:  Negative for chest pain, palpitations, orthopnea, claudication and leg swelling.  Gastrointestinal:   Negative for abdominal pain, blood in stool, constipation, diarrhea, heartburn, melena, nausea and vomiting.  Genitourinary:  Negative for dysuria, flank pain, frequency, hematuria and urgency.  Musculoskeletal:  Negative for falls, joint pain and myalgias.  Skin:  Negative for rash.  Neurological:  Negative for dizziness, tingling, sensory change, weakness and headaches.  Endo/Heme/Allergies:  Negative for polydipsia.  Psychiatric/Behavioral: Negative.  Negative for depression, memory loss and substance abuse. The patient is not nervous/anxious and does not have insomnia.   All other systems reviewed and are negative.    Objective:     Today's Vitals   01/26/23 1035  BP: 122/72  Pulse: (!) 58  Temp: 98 F (36.7 C)  SpO2: 99%  Weight: 149 lb (67.6 kg)  Height: 5\' 3"  (1.6 m)   Body mass index is 26.39 kg/m.  General appearance: alert, no distress, WD/WN, female HEENT: normocephalic, sclerae anicteric, TMs pearly, nares patent, no discharge or erythema, pharynx normal Oral cavity: MMM, no lesions Neck: supple, no lymphadenopathy, no thyromegaly, no masses Heart: RRR, normal S1, S2, no murmurs Lungs: CTA bilaterally, no wheezes, rhonchi, or rales Abdomen: +bs, soft, non tender, non distended, no masses, no hepatomegaly, no splenomegaly Musculoskeletal: nontender, no swelling, no obvious deformity Extremities: no edema, no cyanosis, no clubbing Pulses: 2+ symmetric, upper and lower extremities, normal cap refill Neurological: alert, oriented x 3, CN2-12 intact, strength normal upper extremities and lower extremities, sensation normal throughout, DTRs 2+ throughout, no cerebellar signs, gait normal Psychiatric: normal affect, behavior normal, pleasant   Medicare Attestation I have personally reviewed: The patient's medical and social history Their use of alcohol, tobacco or illicit drugs Their current medications and supplements The patient's functional ability including  ADLs,fall risks, home safety risks, cognitive, and hearing and visual impairment Diet and physical activities Evidence for depression or mood disorders  The patient's weight, height, BMI, and visual acuity have been recorded in the chart.  I have made referrals, counseling, and provided education to the patient based on review of the above and I have provided the patient with a written personalized care plan for preventive services.     Darrol Jump, NP   01/26/2023

## 2023-01-26 NOTE — Patient Instructions (Signed)
  Heat Therapy Heat therapy can help ease sore, stiff, injured, and tight muscles and joints. Heat relaxes your muscles. This may help ease your pain and muscle spasms. What are the risks? If you have any of the following conditions, do not use heat therapy unless your doctor says it is okay. These conditions include: New bruises. Open wounds. Any of these in the area being treated: Healing wounds. Infected skin. Scarred skin. Problems with how blood moves through your body (circulation). Loss of feeling (numbness) in the part of your body that is being treated. Unusual swelling of the part of the body that is being treated. Blood clots. Diabetes. Heart disease. Cancer. Not being able to communicate pain. This may include young children and people who have problems with their brain function (dementia). How to use heat therapy There are different kinds of heat therapy. These include: Moist heat pack. Hot water bottle. Electric heating pad. Heated gel pack. Heated wrap. Warm water bath. Your doctor will tell you how to use heat therapy. In general, you should: Place a towel between your skin and the heat source. Leave the heat on for 20-30 minutes. Your skin may turn pink. Take off the heat if your skin turns bright red. This is very important. If you cannot feel pain, heat, or cold, you have a greater risk of getting burned. Your doctor may also tell you to take a warm water bath. To do this: Put a non-slip pad in the bathtub to prevent a fall. Fill the bathtub with warm water. Check the water temperature. Soak in the water for 15-20 minutes, or as told by your doctor. Be careful when you stand up after the bath. You may feel dizzy. Pat yourself dry after the bath. Do not rub your skin to dry it. General recommendations for heat therapy Be careful not to burn your skin when using heat therapy. High heat or using heat for a long time can cause burns. Do not sleep while using  heat therapy. Only use heat therapy while you are awake. Check your skin during heat therapy. Do not use heat therapy if you have a new injury, especially if you have swelling on the injured area. Do not use heat therapy on areas of your skin that are already irritated, such as with a rash or sunburn. Do not use heat therapy if your skin turns bright red. Contact a doctor if: You have blisters, redness, swelling, or loss of feeling in the area where you use heat therapy. You have new pain. You have pain that gets worse. Summary Heat therapy is the use of heat to help ease sore, stiff, injured, and tight muscles and joints. There are different types of heat therapy. Your doctor will tell you which one to use. Only use heat therapy while you are awake. Watch your skin to make sure you do not get burned while using heat therapy. This information is not intended to replace advice given to you by your health care provider. Make sure you discuss any questions you have with your health care provider. Document Revised: 08/21/2020 Document Reviewed: 08/21/2020 Elsevier Patient Education  [place].

## 2023-01-27 LAB — COMPLETE METABOLIC PANEL WITH GFR
AG Ratio: 1.9 (calc) (ref 1.0–2.5)
ALT: 19 U/L (ref 6–29)
AST: 21 U/L (ref 10–35)
Albumin: 4.8 g/dL (ref 3.6–5.1)
Alkaline phosphatase (APISO): 38 U/L (ref 37–153)
BUN/Creatinine Ratio: 30 (calc) — ABNORMAL HIGH (ref 6–22)
BUN: 32 mg/dL — ABNORMAL HIGH (ref 7–25)
CO2: 30 mmol/L (ref 20–32)
Calcium: 10.1 mg/dL (ref 8.6–10.4)
Chloride: 101 mmol/L (ref 98–110)
Creat: 1.06 mg/dL — ABNORMAL HIGH (ref 0.60–1.00)
Globulin: 2.5 g/dL (calc) (ref 1.9–3.7)
Glucose, Bld: 96 mg/dL (ref 65–99)
Potassium: 4.2 mmol/L (ref 3.5–5.3)
Sodium: 141 mmol/L (ref 135–146)
Total Bilirubin: 0.6 mg/dL (ref 0.2–1.2)
Total Protein: 7.3 g/dL (ref 6.1–8.1)
eGFR: 56 mL/min/{1.73_m2} — ABNORMAL LOW (ref >=60)

## 2023-01-27 LAB — CBC WITH DIFFERENTIAL/PLATELET
Absolute Monocytes: 296 cells/uL (ref 200–950)
Basophils Absolute: 29 cells/uL (ref 0–200)
Basophils Relative: 0.5 %
Eosinophils Absolute: 29 cells/uL (ref 15–500)
Eosinophils Relative: 0.5 %
HCT: 37 % (ref 35.0–45.0)
Hemoglobin: 12.7 g/dL (ref 11.7–15.5)
Lymphs Abs: 2166 cells/uL (ref 850–3900)
MCH: 30.6 pg (ref 27.0–33.0)
MCHC: 34.3 g/dL (ref 32.0–36.0)
MCV: 89.2 fL (ref 80.0–100.0)
MPV: 9.5 fL (ref 7.5–12.5)
Monocytes Relative: 5.2 %
Neutro Abs: 3181 cells/uL (ref 1500–7800)
Neutrophils Relative %: 55.8 %
Platelets: 312 10*3/uL (ref 140–400)
RBC: 4.15 10*6/uL (ref 3.80–5.10)
RDW: 12.5 % (ref 11.0–15.0)
Total Lymphocyte: 38 %
WBC: 5.7 10*3/uL (ref 3.8–10.8)

## 2023-01-27 LAB — LIPID PANEL
Cholesterol: 157 mg/dL (ref <200)
HDL: 56 mg/dL (ref >=50)
LDL Cholesterol (Calc): 76 mg/dL (calc)
Non-HDL Cholesterol (Calc): 101 mg/dL (calc) (ref <130)
Total CHOL/HDL Ratio: 2.8 (calc) (ref <5.0)
Triglycerides: 153 mg/dL — ABNORMAL HIGH (ref <150)

## 2023-01-27 LAB — HEMOGLOBIN A1C
Hgb A1c MFr Bld: 5.9 % of total Hgb — ABNORMAL HIGH (ref <5.7)
Mean Plasma Glucose: 123 mg/dL
eAG (mmol/L): 6.8 mmol/L

## 2023-01-27 LAB — TSH: TSH: 1.8 mIU/L (ref 0.40–4.50)

## 2023-01-27 LAB — VITAMIN D 25 HYDROXY (VIT D DEFICIENCY, FRACTURES): Vit D, 25-Hydroxy: 67 ng/mL (ref 30–100)

## 2023-02-02 ENCOUNTER — Other Ambulatory Visit: Payer: Self-pay

## 2023-02-02 DIAGNOSIS — E039 Hypothyroidism, unspecified: Secondary | ICD-10-CM

## 2023-02-02 MED ORDER — LEVOTHYROXINE SODIUM 50 MCG PO TABS: ORAL_TABLET | ORAL | 0 refills | Status: DC

## 2023-02-03 ENCOUNTER — Telehealth: Payer: Self-pay | Admitting: Nurse Practitioner

## 2023-02-03 NOTE — Telephone Encounter (Signed)
Pt is wanting to know if it is okay to take famotidine and acyclovir with the bp meds

## 2023-02-04 NOTE — Telephone Encounter (Signed)
Patient is aware 

## 2023-02-05 ENCOUNTER — Ambulatory Visit (INDEPENDENT_AMBULATORY_CARE_PROVIDER_SITE_OTHER): Payer: PPO | Admitting: Nurse Practitioner

## 2023-02-05 VITALS — BP 98/60 | HR 59 | Temp 97.4°F | Wt 149.0 lb

## 2023-02-05 DIAGNOSIS — Z79899 Other long term (current) drug therapy: Secondary | ICD-10-CM

## 2023-02-05 DIAGNOSIS — R42 Dizziness and giddiness: Secondary | ICD-10-CM | POA: Diagnosis not present

## 2023-02-05 DIAGNOSIS — R0989 Other specified symptoms and signs involving the circulatory and respiratory systems: Secondary | ICD-10-CM | POA: Diagnosis not present

## 2023-02-05 NOTE — Progress Notes (Unsigned)
Assessment and Plan:  Sarah Hartman was seen today for an episodic visit.  Diagnoses and all order for this visit:  Labile hypertension Decrease Losartan to 20 mg daily Continue Bisoprolol 10 mg Continue HCTZ 25 mg Monitor BP for goal of 110-130/70-80.  Dizziness Change positions slowly Contact guard when standing  Medication management All medications discussed and reviewed in full. All questions and concerns regarding medications addressed.     Notify office for further evaluation and treatment, questions or concerns if s/s fail to improve. The risks and benefits of my recommendations, as well as other treatment options were discussed with the patient today. Questions were answered.  Further disposition pending results of labs. Discussed med's effects and SE's.    [name], counseling, chart review, and critical decision making was performed.   Future Appointments  Date Time Provider Department Center  04/28/2023 11:30 AM Sarah Cowboy, MD [facility] None  10/28/2023  2:00 PM Sarah Cowboy, MD [facility] None  01/28/2024 11:00 AM Sarah Glimpse, NP [facility] None    ------------------------------------------------------------------------------------------------------------------   HPI BP 98/60   Pulse (!) 59   Temp (!) 97.4 F (36.3 C)   Wt 149 lb (67.6 kg)   SpO2 98%   BMI 26.39 kg/m   72 y.o.female presents for evaluation of hypotension due to medications.  She is currently taking Bisoprolol 10 mg, HCTZ 25 mg and Olmesartan 40 mg.  States in home average BP's have been 90/60 to 110/80 with dizziness and visual disturbances seeing black spots.  Her BP today is below goal.  She is continuing to report dizziness.  BP Readings from Last 3 Encounters:  02/05/23 98/60  01/26/23 122/72  11/20/22 (!) 148/70     Past Medical History:  Diagnosis Date   Adenomatous colon polyp    Allergy    GERD (gastroesophageal reflux disease)    Heart  murmur    history of as teenager    Hyperlipidemia    IBS (irritable bowel syndrome)    Labile hypertension    "white coat" per pt   Macular degeneration of left eye    RLS (restless legs syndrome)    Thyroid disease    hypothyroid   Vitamin D deficiency      Allergies  Allergen Reactions   Codeine Nausea And Vomiting and Other (See Comments)    Migraine    Current Outpatient Medications on File Prior to Visit  Medication Sig   bisoprolol (ZEBETA) 10 MG tablet Take  1/2 to 1 tablet  every  Morning  for BP   Cholecalciferol (VITAMIN D3) 5000 units TABS Take 5,000 Units by mouth daily.   hydrochlorothiazide (HYDRODIURIL) 25 MG tablet Take  1 tablet  Daily  for BP and Fluid   levothyroxine (SYNTHROID) 50 MCG tablet TAKE 1 TABLET BY MOUTH ONCE DAILY ON AN EMPTY STOMACH FOR AND NO ANTACID MEDS, CALCIUM, OR MAGNESIUM FOR 4 HOURS, AND AVOID BIOTIN   Magnesium 500 MG TABS Take by mouth 2 (two) times daily.   mometasone (ELOCON) 0.1 % cream Apply 1 Application topically as needed.   Multiple Vitamins-Minerals (PRESERVISION AREDS 2 PO) daily.   olmesartan (BENICAR) 40 MG tablet Take  1 tablet  Daily  for BP   rosuvastatin (CRESTOR) 40 MG tablet Take  1 tablet   every other day  for Cholesterol   No current facility-administered medications on file prior to visit.    ROS: all negative except what is noted in the HPI.  Physical Exam:  BP 98/60   Pulse (!) 59   Temp (!) 97.4 F (36.3 C)   Wt 149 lb (67.6 kg)   SpO2 98%   BMI 26.39 kg/m   General Appearance: NAD.  Awake, conversant and cooperative. Eyes: PERRLA, EOMs intact.  Sclera white.  Conjunctiva without erythema. Sinuses: No frontal/maxillary tenderness.  No nasal discharge. Nares patent.  ENT/Mouth: Ext aud canals clear.  Bilateral TMs w/DOL and without erythema or bulging. Hearing intact.  Posterior pharynx without swelling or exudate.  Tonsils without swelling or erythema.  Neck: Supple.  No masses, nodules  or thyromegaly. Respiratory: Effort is regular with non-labored breathing. Breath sounds are equal bilaterally without rales, rhonchi, wheezing or stridor.  Cardio: RRR with no MRGs. Brisk peripheral pulses without edema.  Abdomen: Active BS in all four quadrants.  Soft and non-tender without guarding, rebound tenderness, hernias or masses. Lymphatics: Non tender without lymphadenopathy.  Musculoskeletal: Full ROM, 5/5 strength, normal ambulation.  No clubbing or cyanosis. Skin: Appropriate color for ethnicity. Warm without rashes, lesions, ecchymosis, ulcers.  Neuro: CN II-XII grossly normal. Normal muscle tone without cerebellar symptoms and intact sensation.   Psych: AO X 3,  appropriate mood and affect, insight and judgment.     Sarah GlimpseNYA Kalyani Maeda, NP 11:26 AM [facility]

## 2023-02-07 NOTE — Patient Instructions (Signed)
Orthostatic Hypotension Blood pressure is a measurement of how strongly, or weakly, your circulating blood is pressing against the walls of your arteries. Orthostatic hypotension is a drop in blood pressure that can happen when you change positions, such as when you go from lying down to standing. Arteries are blood vessels that carry blood from your heart throughout your body. When blood pressure is too low, you may not get enough blood to your brain or to the rest of your organs. Orthostatic hypotension can cause light-headedness, sweating, rapid heartbeat, blurred vision, and fainting. These symptoms require further investigation into the cause. What are the causes? Orthostatic hypotension can be caused by many things, including: Sudden changes in posture, such as standing up quickly after you have been sitting or lying down. Loss of blood (anemia) or loss of body fluids (dehydration). Heart problems, neurologic problems, or hormone problems. Pregnancy. Aging. The risk for this condition increases as you get older. Severe infection (sepsis). Certain medicines, such as medicines for high blood pressure or medicines that make the body lose excess fluids (diuretics). What are the signs or symptoms? Symptoms of this condition may include: Weakness, light-headedness, or dizziness. Sweating. Blurred vision. Tiredness (fatigue). Rapid heartbeat. Fainting, in severe cases. How is this diagnosed? This condition is diagnosed based on: Your symptoms and medical history. Your blood pressure measurements. Your health care provider will check your blood pressure when you are: Lying down. Sitting. Standing. A blood pressure reading is recorded as two numbers, such as "120 over 80" (or 120/80). The first ("top") number is called the systolic pressure. It is a measure of the pressure in your arteries as your heart beats. The second ("bottom") number is called the diastolic pressure. It is a measure of  the pressure in your arteries when your heart relaxes between beats. Blood pressure is measured in a unit called mmHg. Healthy blood pressure for most adults is 120/80 mmHg. Orthostatic hypotension is defined as a 20 mmHg drop in systolic pressure or a 10 mmHg drop in diastolic pressure within 3 minutes of standing. Other information or tests that may be used to diagnose orthostatic hypotension include: Your other vital signs, such as your heart rate and temperature. Blood tests. An electrocardiogram (ECG) or echocardiogram. A Holter monitor. This is a device you wear that records your heart rhythm continuously, usually for 24-48 hours. Tilt table test. For this test, you will be safely secured to a table that moves you from a lying position to an upright position. Your heart rhythm and blood pressure will be monitored during the test. How is this treated? This condition may be treated by: Changing your diet. This may involve eating more salt (sodium) or drinking more water. Changing the dosage of certain medicines you are taking that might be lowering your blood pressure. Correcting the underlying reason for the orthostatic hypotension. Wearing compression stockings. Taking medicines to raise your blood pressure. Avoiding actions that trigger symptoms. Follow these instructions at home: Medicines Take over-the-counter and prescription medicines only as told by your health care provider. Follow instructions from your health care provider about changing the dosage of your current medicines, if this applies. Do not stop or adjust any of your medicines on your own. Eating and drinking  Drink enough fluid to keep your urine pale yellow. Eat extra salt only as directed. Do not add extra salt to your diet unless advised by your health care provider. Eat frequent, small meals. Avoid standing up suddenly after eating. General instructions    Get up slowly from lying down or sitting positions. This  gives your blood pressure a chance to adjust. Avoid hot showers and excessive heat as directed by your health care provider. Engage in regular physical activity as directed by your health care provider. If you have compression stockings, wear them as told. Keep all follow-up visits. This is important. Contact a health care provider if: You have a fever for more than 2-3 days. You feel more thirsty than usual. You feel dizzy or weak. Get help right away if: You have chest pain. You have a fast or irregular heartbeat. You become sweaty or feel light-headed. You feel short of breath. You faint. You have any symptoms of a stroke. "BE FAST" is an easy way to remember the main warning signs of a stroke: B - Balance. Signs are dizziness, sudden trouble walking, or loss of balance. E - Eyes. Signs are trouble seeing or a sudden change in vision. F - Face. Signs are sudden weakness or numbness of the face, or the face or eyelid drooping on one side. A - Arms. Signs are weakness or numbness in an arm. This happens suddenly and usually on one side of the body. S - Speech. Signs are sudden trouble speaking, slurred speech, or trouble understanding what people say. T - Time. Time to call emergency services. Write down what time symptoms started. You have other signs of a stroke, such as: A sudden, severe headache with no known cause. Nausea or vomiting. Seizure. These symptoms may represent a serious problem that is an emergency. Do not wait to see if the symptoms will go away. Get medical help right away. Call your local emergency services (911 in the [place] Do not drive yourself to the hospital. Summary Orthostatic hypotension is a sudden drop in blood pressure. It can cause light-headedness, sweating, rapid heartbeat, blurred vision, and fainting. Orthostatic hypotension can be diagnosed by having your blood pressure taken while lying down, sitting, and then standing. Treatment may involve  changing your diet, wearing compression stockings, sitting up slowly, adjusting your medicines, or correcting the underlying reason for the orthostatic hypotension. Get help right away if you have chest pain, a fast or irregular heartbeat, or symptoms of a stroke. This information is not intended to replace advice given to you by your health care provider. Make sure you discuss any questions you have with your health care provider. Document Revised: 01/02/2021 Document Reviewed: 01/02/2021 Elsevier Patient Education  2023 [employer]  

## 2023-02-26 DIAGNOSIS — H2513 Age-related nuclear cataract, bilateral: Secondary | ICD-10-CM | POA: Diagnosis not present

## 2023-02-26 DIAGNOSIS — H43812 Vitreous degeneration, left eye: Secondary | ICD-10-CM | POA: Diagnosis not present

## 2023-02-26 DIAGNOSIS — D23122 Other benign neoplasm of skin of left lower eyelid, including canthus: Secondary | ICD-10-CM | POA: Diagnosis not present

## 2023-02-26 DIAGNOSIS — H353131 Nonexudative age-related macular degeneration, bilateral, early dry stage: Secondary | ICD-10-CM | POA: Diagnosis not present

## 2023-03-18 ENCOUNTER — Telehealth: Payer: Self-pay | Admitting: Nurse Practitioner

## 2023-03-18 NOTE — Telephone Encounter (Signed)
Patient is requesting a refill on Acyclovir to [facility] on [address]

## 2023-03-22 NOTE — Telephone Encounter (Signed)
Patient states that she is having an outbreak so she has made an appointment to be seen

## 2023-03-23 ENCOUNTER — Encounter: Payer: Self-pay | Admitting: Nurse Practitioner

## 2023-03-23 ENCOUNTER — Ambulatory Visit (INDEPENDENT_AMBULATORY_CARE_PROVIDER_SITE_OTHER): Payer: PPO | Admitting: Nurse Practitioner

## 2023-03-23 VITALS — BP 110/72 | HR 57 | Temp 97.9°F | Ht 63.0 in | Wt 148.4 lb

## 2023-03-23 DIAGNOSIS — B009 Herpesviral infection, unspecified: Secondary | ICD-10-CM | POA: Diagnosis not present

## 2023-03-23 DIAGNOSIS — R0989 Other specified symptoms and signs involving the circulatory and respiratory systems: Secondary | ICD-10-CM

## 2023-03-23 DIAGNOSIS — Z79899 Other long term (current) drug therapy: Secondary | ICD-10-CM | POA: Diagnosis not present

## 2023-03-23 MED ORDER — ACYCLOVIR 400 MG PO TABS: ORAL_TABLET | ORAL | 3 refills | Status: AC

## 2023-03-23 NOTE — Progress Notes (Signed)
Assessment and Plan:  Sarah Hartman was seen today for an episodic visit.  Diagnoses and all order for this visit:  1. HSV-1 (herpes simplex virus 1) infection Star acyclovir as directed as needed. Avoid triggers including stress, sun, highly acidic foods  - acyclovir (ZOVIRAX) 400 MG tablet; Take  1 capsule  3 x /day  with Meals  &  2 capsules  at Bedtime for Mouth Ulcers  Dispense: 50 tablet; Refill: 3  2. Labile hypertension Decrease Bisoprolol to 1/2 tablet daily for goal of 120-130/70-80. Discussed DASH (Dietary Approaches to Stop Hypertension) DASH diet is lower in sodium than a typical American diet. Cut back on foods that are high in saturated fat, cholesterol, and trans fats. Eat more whole-grain foods, fish, poultry, and nuts Remain active and exercise as tolerated daily.  Monitor BP at home-Call if greater than 130/80.  Check CMP/CBC   3. Medication management All medications discussed and reviewed in full. All questions and concerns regarding medications addressed.    Notify office for further evaluation and treatment, questions or concerns if s/s fail to improve. The risks and benefits of my recommendations, as well as other treatment options were discussed with the patient today. Questions were answered.  Further disposition pending results of labs. Discussed med's effects and SE's.    [name], counseling, chart review, and critical decision making was performed.   Future Appointments  Date Time Provider Department Center  04/28/2023 11:30 AM Lucky Cowboy, MD [facility] None  10/28/2023  2:00 PM Lucky Cowboy, MD [facility] None  01/28/2024 11:00 AM Sarah Glimpse, NP [facility] None    ------------------------------------------------------------------------------------------------------------------   HPI BP 110/72   Pulse (!) 57   Temp 97.9 F (36.6 C)   Ht 5\' 3"  (1.6 m)   Wt 148 lb 6.4 oz (67.3 kg)   SpO2 98%   BMI 26.29  kg/m   72 y.o.female presents for evaluation of fever blister on lower lip.  States that she has a hx and has used Acyclovir in the past with effectiveness.  She has been using OTC topical Abreva.  She has avoided stress and high acidic foods, however, did not kayaking and had increase sun exposure.  Denies fever, chills, N/V.    She has been monitoring her BP and  has noticed continued intermittent hypotension with dizziness.  She has been taking Bisoprolol 10 mg daily.  She is staying well hydrated.  Denis syncope, CP, heart palpations.  Past Medical History:  Diagnosis Date   Adenomatous colon polyp    Allergy    GERD (gastroesophageal reflux disease)    Heart murmur    history of as teenager    Hyperlipidemia    IBS (irritable bowel syndrome)    Labile hypertension    "white coat" per pt   Macular degeneration of left eye    RLS (restless legs syndrome)    Thyroid disease    hypothyroid   Vitamin D deficiency      Allergies  Allergen Reactions   Codeine Nausea And Vomiting and Other (See Comments)    Migraine    Current Outpatient Medications on File Prior to Visit  Medication Sig   bisoprolol (ZEBETA) 10 MG tablet Take  1/2 to 1 tablet  every  Morning  for BP   Cholecalciferol (VITAMIN D3) 5000 units TABS Take 5,000 Units by mouth daily.   hydrochlorothiazide (HYDRODIURIL) 25 MG tablet Take  1 tablet  Daily  for BP and Fluid  levothyroxine (SYNTHROID) 50 MCG tablet TAKE 1 TABLET BY MOUTH ONCE DAILY ON AN EMPTY STOMACH FOR AND NO ANTACID MEDS, CALCIUM, OR MAGNESIUM FOR 4 HOURS, AND AVOID BIOTIN   Magnesium 500 MG TABS Take by mouth 2 (two) times daily.   mometasone (ELOCON) 0.1 % cream Apply 1 Application topically as needed.   Multiple Vitamins-Minerals (PRESERVISION AREDS 2 PO) daily.   olmesartan (BENICAR) 40 MG tablet Take  1 tablet  Daily  for BP (Patient taking differently: 20 mg. Take  1 tablet  Daily  for BP)   rosuvastatin (CRESTOR) 40 MG tablet Take   1 tablet   every other day  for Cholesterol   No current facility-administered medications on file prior to visit.    ROS: all negative except what is noted in the HPI.   Physical Exam:  BP 110/72   Pulse (!) 57   Temp 97.9 F (36.6 C)   Ht 5\' 3"  (1.6 m)   Wt 148 lb 6.4 oz (67.3 kg)   SpO2 98%   BMI 26.29 kg/m   General Appearance: NAD.  Awake, conversant and cooperative. Eyes: PERRLA, EOMs intact.  Sclera white.  Conjunctiva without erythema. Sinuses: No frontal/maxillary tenderness.  No nasal discharge. Nares patent.  ENT/Mouth: Ext aud canals clear.  Bilateral TMs w/DOL and without erythema or bulging. Hearing intact.  Posterior pharynx without swelling or exudate.  Tonsils without swelling or erythema.  Neck: Supple.  No masses, nodules or thyromegaly. Respiratory: Effort is regular with non-labored breathing. Breath sounds are equal bilaterally without rales, rhonchi, wheezing or stridor.  Cardio: RRR with no MRGs. Brisk peripheral pulses without edema.  Abdomen: Active BS in all four quadrants.  Soft and non-tender without guarding, rebound tenderness, hernias or masses. Lymphatics: Non tender without lymphadenopathy.  Musculoskeletal: Full ROM, 5/5 strength, normal ambulation.  No clubbing or cyanosis. Skin: Appropriate color for ethnicity. Warm without rashes, lesions, ecchymosis, ulcers. Lower lip with one round erythematosa scabbed over abrasion. Neuro: CN II-XII grossly normal. Normal muscle tone without cerebellar symptoms and intact sensation.   Psych: AO X 3,  appropriate mood and affect, insight and judgment.     Sarah Glimpse, NP 3:51 PM [facility]

## 2023-04-14 DIAGNOSIS — Z1231 Encounter for screening mammogram for malignant neoplasm of breast: Secondary | ICD-10-CM | POA: Diagnosis not present

## 2023-04-14 LAB — HM MAMMOGRAPHY

## 2023-04-28 ENCOUNTER — Encounter: Payer: Self-pay | Admitting: Internal Medicine

## 2023-04-28 ENCOUNTER — Ambulatory Visit (INDEPENDENT_AMBULATORY_CARE_PROVIDER_SITE_OTHER): Payer: PPO | Admitting: Internal Medicine

## 2023-04-28 VITALS — BP 120/78 | HR 68 | Temp 97.9°F | Resp 16 | Ht 63.0 in | Wt 148.0 lb

## 2023-04-28 DIAGNOSIS — E559 Vitamin D deficiency, unspecified: Secondary | ICD-10-CM | POA: Diagnosis not present

## 2023-04-28 DIAGNOSIS — Z79899 Other long term (current) drug therapy: Secondary | ICD-10-CM

## 2023-04-28 DIAGNOSIS — E782 Mixed hyperlipidemia: Secondary | ICD-10-CM

## 2023-04-28 DIAGNOSIS — R7309 Other abnormal glucose: Secondary | ICD-10-CM

## 2023-04-28 DIAGNOSIS — R0989 Other specified symptoms and signs involving the circulatory and respiratory systems: Secondary | ICD-10-CM | POA: Diagnosis not present

## 2023-04-28 DIAGNOSIS — E039 Hypothyroidism, unspecified: Secondary | ICD-10-CM

## 2023-04-28 NOTE — Progress Notes (Signed)
Future Appointments  Date Time Provider Department  04/28/2023                         6 mo ov 11:30 AM Lucky Cowboy, MD [facility]  10/28/2023                        cpe  2:00 PM Lucky Cowboy, MD [facility]  01/28/2024                          wellness 11:00 AM Adela Glimpse, NP [facility]    History of Present Illness:       This very nice 72 y.o. DWF presents for 6 month follow up with HTN, HLD,  Hypothyroidism, Pre-Diabetes and Vitamin D Deficiency.   Patient has GERD controlled with diet & her meds.            Labile  HTN has been monitored & predates since  2007.  BP has been controlled at home. Today's BP is at goal -  120/78. Patient has had no complaints of any cardiac type chest pain, palpitations, dyspnea / orthopnea / PND, dizziness, claudication, or dependent edema.        Hyperlipidemia is controlled with diet & meds. Patient denies myalgias or other med SE's. Last Lipids were at goal:  Lab Results  Component Value Date   CHOL 157 01/26/2023   HDL 56 01/26/2023   LDLCALC 76 01/26/2023   TRIG 153 (H) 01/26/2023   CHOLHDL 2.8 01/26/2023     Also, the patient has history of PreDiabetes  (A1c 5.7% /2012) and has had no symptoms of reactive hypoglycemia, diabetic polys, paresthesias or visual blurring.  Last A1c was   Lab Results  Component Value Date   HGBA1C 5.9 (H) 01/26/2023                                                                                      Patient was dx'd hypothyroid in 2001 & initiated on replacement therapy.                                                        Further, the patient also has history of Vitamin D Deficiency ("46" on tx /2017)  and supplements vitamin D . Last vitamin D was  Lab Results  Component Value Date   VD25OH 67 01/26/2023     Current Outpatient Medications on File Prior to Visit  Medication Sig   acyclovir  400 MG tablet Take  1 capsule  3 x /day  with Meals  &  2 capsules  at Bedtime     bisoprolol  10 MG tablet Take  1/2 to 1 tablet  every  Morning  f   VITAMIN D  5000 units  Take daily.   hydrochlorothiazide  25 MG tablet Take  1 tablet  Daily     levothyroxine (SYNTHROID) 50  MCG tablet TAKE 1 TABLET  DAILY    Magnesium 500 MG TABS Take  2 times daily.   mometasone (ELOCON) 0.1 % cream Apply  as needed.   PRESERVISION AREDS 2  daily.   olmesartan 40 MG tablet Patient taking  20 mg  Daily  for BP   rosuvastatin 40 MG tablet Take  1 tablet   every other day    Allergies  Allergen Reactions   Codeine Nausea And Vomiting and Other (See Comments)    Migraine     PMHx:   Past Medical History:  Diagnosis Date   Adenomatous colon polyp    Allergy    GERD (gastroesophageal reflux disease)    Heart murmur    history of as teenager    Hyperlipidemia    IBS (irritable bowel syndrome)    Labile hypertension    "white coat" per pt   Macular degeneration of left eye    RLS (restless legs syndrome)    Thyroid disease    hypothyroid   Vitamin D deficiency      Immunization History  Administered Date(s) Administered   Fluad Quad(high Dose 65+) 11/06/2021   Influenza, High Dose Seasonal PF 08/01/2018, 07/07/2019, 09/30/2022   Influenza-Unspecified 07/29/2016, 09/01/2020   Moderna SARS-COV2 Booster Vaccination 09/10/2021   Moderna Sars-Covid-2 Vaccination 11/29/2019, 12/27/2019, 09/01/2020   Pneumococcal Conjugate-13 08/19/2015   Pneumococcal Polysaccharide-23 11/09/2012, 08/01/2018   Respiratory Syncytial Virus Vaccine,Recomb Aduvanted(Arexvy) 07/30/2022   Td 12/27/2018   Tdap 10/18/2008   Zoster, Live 07/03/2014     Past Surgical History:  Procedure Laterality Date   COLONOSCOPY     POLYPECTOMY     TENDON RECONSTRUCTION Right 04/27/2013   Procedure: RIGHT ELBOW DEBRIDE/REPAIR/EXPLORE - LATERAL HUMERAL EPICONDYLE;  Surgeon: Loreta Ave, MD;  Location: [facility];  Service: Orthopedics;  Laterality: Right;   TUBAL LIGATION       FHx:     Reviewed / unchanged   SHx:    Reviewed / unchanged    Systems Review:  Constitutional: Denies fever, chills, wt changes, headaches, insomnia, fatigue, night sweats, change in appetite. Eyes: Denies redness, blurred vision, diplopia, discharge, itchy, watery eyes.  ENT: Denies discharge, congestion, post nasal drip, epistaxis, sore throat, earache, hearing loss, dental pain, tinnitus, vertigo, sinus pain, snoring.  CV: Denies chest pain, palpitations, irregular heartbeat, syncope, dyspnea, diaphoresis, orthopnea, PND, claudication or edema. Respiratory: denies cough, dyspnea, DOE, pleurisy, hoarseness, laryngitis, wheezing.  Gastrointestinal: Denies dysphagia, odynophagia, heartburn, reflux, water brash, abdominal pain or cramps, nausea, vomiting, bloating, diarrhea, constipation, hematemesis, melena, hematochezia  or hemorrhoids. Genitourinary: Denies dysuria, frequency, urgency, nocturia, hesitancy, discharge, hematuria or flank pain. Musculoskeletal: Denies arthralgias, myalgias, stiffness, jt. swelling, pain, limping or strain/sprain.  Skin: Denies pruritus, rash, hives, warts, acne, eczema or change in skin lesion(s). Neuro: No weakness, tremor, incoordination, spasms, paresthesia or pain. Psychiatric: Denies confusion, memory loss or sensory loss. Endo: Denies change in weight, skin or hair change.  Heme/Lymph: No excessive bleeding, bruising or enlarged lymph nodes.   Physical Exam  BP 120/78   Pulse 68   Temp 97.9 F (36.6 C)   Resp 16   Ht 5\' 3"  (1.6 m)   Wt 148 lb (67.1 kg)   SpO2 98%   BMI 26.22 kg/m   Appears  well nourished, well groomed  and in no distress.  Eyes: PERRLA, EOMs, conjunctiva no swelling or erythema. Sinuses: No frontal/maxillary tenderness ENT/Mouth: EAC's clear, TM's nl w/o erythema, bulging. Nares clear w/o erythema, swelling,  exudates. Oropharynx clear without erythema or exudates. Oral hygiene is good. Tongue normal, non obstructing. Hearing  intact.  Neck: Supple. Thyroid not palpable. Car 2+/2+ without bruits, nodes or JVD. Chest: Respirations nl with BS clear & equal w/o rales, rhonchi, wheezing or stridor.  Cor: Heart sounds normal w/ regular rate and rhythm without sig. murmurs, gallops, clicks or rubs. Peripheral pulses normal and equal  without edema.  Abdomen: Soft & bowel sounds normal. Non-tender w/o guarding, rebound, hernias, masses or organomegaly.  Lymphatics: Unremarkable.  Musculoskeletal: Full ROM all peripheral extremities, joint stability, 5/5 strength and normal gait.  Skin: Warm, dry without exposed rashes, lesions or ecchymosis apparent.  Neuro: Cranial nerves intact, reflexes equal bilaterally. Sensory-motor testing grossly intact. Tendon reflexes grossly intact.  Pysch: Alert & oriented x 3.  Insight and judgement nl & appropriate. No ideations.   Assessment and Plan:  - Continue medication, monitor blood pressure at home.  - Continue DASH diet.  Reminder to go to the ER if any CP,  SOB, nausea, dizziness, severe HA, changes vision/speech.  - Continue diet/meds, exercise,& lifestyle modifications.  - Continue monitor periodic cholesterol/liver & renal functions   1. Labile hypertension  - CBC with Differential/Platelet - COMPLETE METABOLIC PANEL WITH GFR - Magnesium  2. Hyperlipidemia, mixed  - Lipid panel - TSH  3. Abnormal glucose  - Hemoglobin A1c - Insulin, random  4. Vitamin D deficiency  - VITAMIN D 25 Hydroxy (Vit-D Deficiency, Fractures)  5. Hypothyroidism, unspecified type  - TSH  6. Medication management  - CBC with Differential/Platelet - COMPLETE METABOLIC PANEL WITH GFR - Magnesium - Lipid panel - TSH - Hemoglobin A1c - Insulin, random - VITAMIN D 25 Hydroxy     - Continue diet, exercise  - Lifestyle modifications.  - Monitor appropriate labs. - Continue supplementation.        Discussed  regular exercise, BP monitoring, weight control to achieve/maintain  BMI less than 25 and discussed med and SE's. Recommended labs to assess /monitor clinical status .  I discussed the assessment and treatment plan with the patient. The patient was provided an opportunity to ask questions and all were answered. The patient agreed with the plan and demonstrated an understanding of the instructions.  I provided over 30 minutes of exam, counseling, chart review and  complex critical decision making.        The patient was advised to call back or seek an in-person evaluation if the symptoms worsen or if the condition fails to improve as anticipated.   Marinus Maw, MD

## 2023-04-28 NOTE — Patient Instructions (Signed)
Due to recent changes in healthcare laws, you may see the results of your imaging and laboratory studies on MyChart before your provider has had a chance to review them.  We understand that in some cases there may be results that are confusing or concerning to you. Not all laboratory results come back in the same time frame and the provider may be waiting for multiple results in order to interpret others.  Please give us 48 hours in order for your provider to thoroughly review all the results before contacting the office for clarification of your results.  ++++++++++++++++++++++++++  Vit D  & Vit C 1,000 mg   are recommended to help protect  against the Covid-19 and other Corona viruses.    Also it's recommended  to take  Zinc 50 mg  to help  protect against the Covid-19   and best place to get  is also on [employer]  and don't pay more than 6-8 cents /pill !   +++++++++++++++++++++++++++++++++++++++ Recommend Adult Low Dose Aspirin or  coated  Aspirin 81 mg daily  To reduce risk of Colon Cancer 40 %,  Skin Cancer 26 % ,  Melanoma 46%  and  Pancreatic cancer 60% +++++++++++++++++++++++++++++++++++++++++ Vitamin D goal  is between 70-100.  Please make sure that you are taking your Vitamin D as directed.  It is very important as a natural anti-inflammatory  helping hair, skin, and nails, as well as reducing stroke and heart attack risk.  It helps your bones and helps with mood. It also decreases numerous cancer risks so please take it as directed.  Low Vit D is associated with a 200-300% higher risk for CANCER  and 200-300% higher risk for HEART   ATTACK  &  STROKE.   ...................................... It is also associated with higher death rate at younger ages,  autoimmune diseases like Rheumatoid arthritis, Lupus, Multiple Sclerosis.    Also many other serious conditions, like depression, Alzheimer's Dementia, infertility, muscle aches, fatigue, fibromyalgia - just to name  a few. +++++++++++++++++++++++++++++++++++++++++ Recommend the book "The END of DIETING" by Dr Joel Fuhrman  & the book "The END of DIABETES " by Dr Joel Fuhrman At [employer] - get book & Audio CD's    Being diabetic has a  300% increased risk for heart attack, stroke, cancer, and alzheimer- type vascular dementia. It is very important that you work harder with diet by avoiding all foods that are white. Avoid white rice (brown & wild rice is OK), white potatoes (sweetpotatoes in moderation is OK), White bread or wheat bread or anything made out of white flour like bagels, donuts, rolls, buns, biscuits, cakes, pastries, cookies, pizza crust, and pasta (made from white flour & egg whites) - vegetarian pasta or spinach or wheat pasta is OK. Multigrain breads like Arnold's or Pepperidge Farm, or multigrain sandwich thins or flatbreads.  Diet, exercise and weight loss can reverse and cure diabetes in the early stages.  Diet, exercise and weight loss is very important in the control and prevention of complications of diabetes which affects every system in your body, ie. Brain - dementia/stroke, eyes - glaucoma/blindness, heart - heart attack/heart failure, kidneys - dialysis, stomach - gastric paralysis, intestines - malabsorption, nerves - severe painful neuritis, circulation - gangrene & loss of a leg(s), and finally cancer and Alzheimers.    I recommend avoid fried & greasy foods,  sweets/candy, white rice (brown or wild rice or Quinoa is OK), white potatoes (sweet potatoes are OK) - anything   made from white flour - bagels, doughnuts, rolls, buns, biscuits,white and wheat breads, pizza crust and traditional pasta made of white flour & egg white(vegetarian pasta or spinach or wheat pasta is OK).  Multi-grain bread is OK - like multi-grain flat bread or sandwich thins. Avoid alcohol in excess. Exercise is also important.    Eat all the vegetables you want - avoid meat, especially red meat and dairy - especially  cheese.  Cheese is the most concentrated form of trans-fats which is the worst thing to clog up our arteries. Veggie cheese is OK which can be found in the fresh produce section at [employer] or [employer] or [employer]  +++++++++++++++++++++++++++++++++++++++ DASH Eating Plan  DASH stands for "Dietary Approaches to Stop Hypertension."   The DASH eating plan is a healthy eating plan that has been shown to reduce high blood pressure (hypertension). Additional health benefits may include reducing the risk of type 2 diabetes mellitus, heart disease, and stroke. The DASH eating plan may also help with weight loss. WHAT DO I NEED TO KNOW ABOUT THE DASH EATING PLAN? For the DASH eating plan, you will follow these general guidelines: Choose foods with a percent daily value for sodium of less than 5% (as listed on the food label). Use salt-free seasonings or herbs instead of table salt or sea salt. Check with your health care provider or pharmacist before using salt substitutes. Eat lower-sodium products, often labeled as "lower sodium" or "no salt added." Eat fresh foods. Eat more vegetables, fruits, and low-fat dairy products. Choose whole grains. Look for the word "whole" as the first word in the ingredient list. Choose fish  Limit sweets, desserts, sugars, and sugary drinks. Choose heart-healthy fats. Eat veggie cheese  Eat more home-cooked food and less restaurant, buffet, and fast food. Limit fried foods. Cook foods using methods other than frying. Limit canned vegetables. If you do use them, rinse them well to decrease the sodium. When eating at a restaurant, ask that your food be prepared with less salt, or no salt if possible.                      WHAT FOODS CAN I EAT? Read Dr Joel Fuhrman's books on The End of Dieting & The End of Diabetes  Grains Whole grain or whole wheat bread. Brown rice. Whole grain or whole wheat pasta. Quinoa, bulgur, and whole grain cereals. Low-sodium  cereals. Corn or whole wheat flour tortillas. Whole grain cornbread. Whole grain crackers. Low-sodium crackers.  Vegetables Fresh or frozen vegetables (raw, steamed, roasted, or grilled). Low-sodium or reduced-sodium tomato and vegetable juices. Low-sodium or reduced-sodium tomato sauce and paste. Low-sodium or reduced-sodium canned vegetables.   Fruits All fresh, canned (in natural juice), or frozen fruits.  Protein Products  All fish and seafood.  Dried beans, peas, or lentils. Unsalted nuts and seeds. Unsalted canned beans.  Dairy Low-fat dairy products, such as skim or 1% milk, 2% or reduced-fat cheeses, low-fat ricotta or cottage cheese, or plain low-fat yogurt. Low-sodium or reduced-sodium cheeses.  Fats and Oils Tub margarines without trans fats. Light or reduced-fat mayonnaise and salad dressings (reduced sodium). Avocado. Safflower, olive, or canola oils. Natural peanut or almond butter.  Other Unsalted popcorn and pretzels. The items listed above may not be a complete list of recommended foods or beverages. Contact your dietitian for more options.  +++++++++++++++  WHAT FOODS ARE NOT RECOMMENDED? Grains/ White flour or wheat flour White bread. White pasta. White rice. Refined   cornbread. Bagels and croissants. Crackers that contain trans fat.  Vegetables  Creamed or fried vegetables. Vegetables in a . Regular canned vegetables. Regular canned tomato sauce and paste. Regular tomato and vegetable juices.  Fruits Dried fruits. Canned fruit in light or heavy syrup. Fruit juice.  Meat and Other Protein Products Meat in general - RED meat & White meat.  Fatty cuts of meat. Ribs, chicken wings, all processed meats as bacon, sausage, bologna, salami, fatback, hot dogs, bratwurst and packaged luncheon meats.  Dairy Whole or 2% milk, cream, half-and-half, and cream cheese. Whole-fat or sweetened yogurt. Full-fat cheeses or blue cheese. Non-dairy creamers and whipped toppings.  Processed cheese, cheese spreads, or cheese curds.  Condiments Onion and garlic salt, seasoned salt, table salt, and sea salt. Canned and packaged gravies. Worcestershire sauce. Tartar sauce. Barbecue sauce. Teriyaki sauce. Soy sauce, including reduced sodium. Steak sauce. Fish sauce. Oyster sauce. Cocktail sauce. Horseradish. Ketchup and mustard. Meat flavorings and tenderizers. Bouillon cubes. Hot sauce. Tabasco sauce. Marinades. Taco seasonings. Relishes.  Fats and Oils Butter, stick margarine, lard, shortening and bacon fat. Coconut, palm kernel, or palm oils. Regular salad dressings.  Pickles and olives. Salted popcorn and pretzels.  The items listed above may not be a complete list of foods and beverages to avoid.  

## 2023-04-29 ENCOUNTER — Other Ambulatory Visit: Payer: Self-pay | Admitting: Nurse Practitioner

## 2023-04-29 DIAGNOSIS — E039 Hypothyroidism, unspecified: Secondary | ICD-10-CM

## 2023-04-29 DIAGNOSIS — H608X3 Other otitis externa, bilateral: Secondary | ICD-10-CM | POA: Diagnosis not present

## 2023-04-29 LAB — COMPLETE METABOLIC PANEL WITH GFR
AG Ratio: 2.2 (calc) (ref 1.0–2.5)
ALT: 15 U/L (ref 6–29)
AST: 21 U/L (ref 10–35)
Albumin: 4.9 g/dL (ref 3.6–5.1)
Alkaline phosphatase (APISO): 33 U/L — ABNORMAL LOW (ref 37–153)
BUN/Creatinine Ratio: 25 (calc) — ABNORMAL HIGH (ref 6–22)
BUN: 31 mg/dL — ABNORMAL HIGH (ref 7–25)
CO2: 28 mmol/L (ref 20–32)
Calcium: 10.2 mg/dL (ref 8.6–10.4)
Chloride: 103 mmol/L (ref 98–110)
Creat: 1.25 mg/dL — ABNORMAL HIGH (ref 0.60–1.00)
Globulin: 2.2 g/dL (calc) (ref 1.9–3.7)
Glucose, Bld: 82 mg/dL (ref 65–99)
Potassium: 4.2 mmol/L (ref 3.5–5.3)
Sodium: 142 mmol/L (ref 135–146)
Total Bilirubin: 0.5 mg/dL (ref 0.2–1.2)
Total Protein: 7.1 g/dL (ref 6.1–8.1)
eGFR: 46 mL/min/{1.73_m2} — ABNORMAL LOW (ref >=60)

## 2023-04-29 LAB — CBC WITH DIFFERENTIAL/PLATELET
Absolute Monocytes: 503 cells/uL (ref 200–950)
Basophils Absolute: 38 cells/uL (ref 0–200)
Basophils Relative: 0.5 %
Eosinophils Absolute: 30 cells/uL (ref 15–500)
Eosinophils Relative: 0.4 %
HCT: 34.2 % — ABNORMAL LOW (ref 35.0–45.0)
Hemoglobin: 11.6 g/dL — ABNORMAL LOW (ref 11.7–15.5)
Lymphs Abs: 2580 cells/uL (ref 850–3900)
MCH: 31.1 pg (ref 27.0–33.0)
MCHC: 33.9 g/dL (ref 32.0–36.0)
MCV: 91.7 fL (ref 80.0–100.0)
MPV: 9.6 fL (ref 7.5–12.5)
Monocytes Relative: 6.7 %
Neutro Abs: 4350 cells/uL (ref 1500–7800)
Neutrophils Relative %: 58 %
Platelets: 305 10*3/uL (ref 140–400)
RBC: 3.73 10*6/uL — ABNORMAL LOW (ref 3.80–5.10)
RDW: 12.5 % (ref 11.0–15.0)
Total Lymphocyte: 34.4 %
WBC: 7.5 10*3/uL (ref 3.8–10.8)

## 2023-04-29 LAB — MAGNESIUM: Magnesium: 2.1 mg/dL (ref 1.5–2.5)

## 2023-04-29 LAB — LIPID PANEL
Cholesterol: 142 mg/dL (ref <200)
HDL: 56 mg/dL (ref >=50)
LDL Cholesterol (Calc): 56 mg/dL (calc)
Non-HDL Cholesterol (Calc): 86 mg/dL (calc) (ref <130)
Total CHOL/HDL Ratio: 2.5 (calc) (ref <5.0)
Triglycerides: 244 mg/dL — ABNORMAL HIGH (ref <150)

## 2023-04-29 LAB — VITAMIN D 25 HYDROXY (VIT D DEFICIENCY, FRACTURES): Vit D, 25-Hydroxy: 66 ng/mL (ref 30–100)

## 2023-04-29 LAB — INSULIN, RANDOM: Insulin: 12.4 u[IU]/mL

## 2023-04-29 LAB — TSH: TSH: 2.36 mIU/L (ref 0.40–4.50)

## 2023-04-29 LAB — HEMOGLOBIN A1C
Hgb A1c MFr Bld: 5.7 % of total Hgb — ABNORMAL HIGH (ref <5.7)
Mean Plasma Glucose: 117 mg/dL
eAG (mmol/L): 6.5 mmol/L

## 2023-04-29 NOTE — Progress Notes (Signed)
^<^<^<^<^<^<^<^<^<^<^<^<^<^<^<^<^<^<^<^<^<^<^<^<^<^<^<^<^<^<^<^<^<^<^<^<^ ^>^>^>^>^>^>^>^>^>^>^>>^>^>^>^>^>^>^>^>^>^>^>^>^>^>^>^>^>^>^>^>^>^>^>^>^>  - Kidney functions look a little dehydrated   -  Very important to drink adequate amounts of fluids to prevent permanent damage     - Recommend drink at least 6 bottles (16 ounces) of fluids /water /day                                                                                                               = 96 Oz ~100 oz  - 100 oz = 3,000 cc or 3 liters / day  - >>                                                             That's 1 &1/2 bottles of a 2 liter soda bottle /day !    ^<^<^<^<^<^<^<^<^<^<^<^<^<^<^<^<^<^<^<^<^<^<^<^<^<^<^<^<^<^<^<^<^<^<^<^<^ ^>^>^>^>^>^>^>^>^>^>^>^>^>^>^>^>^>^>^>^>^>^>^>^>^>^>^>^>^>^>^>^>^>^>^>^>^  - Chol = 142  -  Excellent   - Very low risk for Heart Attack  / Stroke  - But Triglycerides (  = 244    ) or fats in blood are too high                 (   Ideal or  Goal is less than 150  !  )    - Recommend avoid fried & greasy foods,  sweets / candy,   - Avoid white rice  (brown or wild rice or Quinoa is OK),   - Avoid white potatoes  (sweet potatoes are OK)   - Avoid anything made from white flour  - bagels, doughnuts, rolls, buns, biscuits, white and   wheat breads, pizza crust and traditional  pasta made of white flour & egg white  - (vegetarian pasta or spinach or wheat pasta is OK).    - Multi-grain bread is OK - like multi-grain flat bread or  sandwich thins.   - Avoid alcohol in excess.   - Exercise is also important.  ^>^>^>^>^>^>^>^>^>^>^>^>^>^>^>^>^>^>^>^>^>^>^>^>^>^>^>^>^>^>^>^>^>^>^>^>^ ^>^>^>^>^>^>^>^>^>^>^>^>^>^>^>^>^>^>^>^>^>^>^>^>^>^>^>^>^>^>^>^>^>^>^>^>^  -  A1c = 5.9% = 12 week average blood sugar  Blood sugar and A1c are                                                                Still elevated in the borderline &   early or pre-diabetes range which has the  same                           300% increased risk for heart attack, stroke, cancer and                          alzheimer-  type vascular dementia as full blown diabetes.   But the good news is that diet, exercise with weight loss can                                                            cure the early diabetes at this point.   -   It is very important that you work harder with diet by              avoiding all foods that are white except chicken, fish & calliflower.   - Avoid white rice  (brown & wild rice is OK),   - Avoid white potatoes  (sweet potatoes in moderation is OK),   White bread or wheat bread or anything made out of                                       white flour like bagels, donuts, rolls, buns, biscuits, cakes,  - pastries, cookies, pizza crust, and pasta (made from  white flour & egg whites)   - vegetarian pasta or spinach or wheat pasta is OK.  - Multigrain breads like Arnold's, Pepperidge Farm or                                                      Multi-grain sandwich thins or high fiber breads like   [place] bread or "Dave's Killer" breads that are  4 to 5 grams fiber per slice !  are best.    Diet, exercise and weight loss can reverse and cure  diabetes in the early stages.    - Diet, exercise and weight loss is very important in the   control and prevention of complications of diabetes which  affects every system in your body,   ^<^<^<^<^<^<^<^<^<^<^<^<^<^<^<^<^<^<^<^<^<^<^<^<^<^<^<^<^<^<^<^<^<^<^<^<^ ^>^>^>^>^>^>^>^>^>^>^>^>^>^>^>^>^>^>^>^>^>^>^>^>^>^>^>^>^>^>^>^>^>^>^>^>^  -  Vitamin D =- 66 - Excellent   - Please keep dosage  same   ^<^<^<^<^<^<^<^<^<^<^<^<^<^<^<^<^<^<^<^<^<^<^<^<^<^<^<^<^<^<^<^<^<^<^<^<^ ^>^>^>^>^>^>^>^>^>^>^>^>^>^>^>^>^>^>^>^>^>^>^>^>^>^>^>^>^>^>^>^>^>^>^>^>^  - All Else - CBC - Electrolytes - Liver - Magnesium & Thyroid    - all  Normal /  OK  ^<^<^<^<^<^<^<^<^<^<^<^<^<^<^<^<^<^<^<^<^<^<^<^<^<^<^<^<^<^<^<^<^<^<^<^<^ ^>^>^>^>^>^>^>^>^>^>^>^>^>^>^>^>^>^>^>^>^>^>^>^>^>^>^>^>^>^>^>^>^>^>^>^>^

## 2023-05-10 ENCOUNTER — Other Ambulatory Visit: Payer: Self-pay | Admitting: Nurse Practitioner

## 2023-05-10 DIAGNOSIS — E782 Mixed hyperlipidemia: Secondary | ICD-10-CM

## 2023-05-11 ENCOUNTER — Other Ambulatory Visit: Payer: Self-pay | Admitting: Internal Medicine

## 2023-05-11 DIAGNOSIS — I10 Essential (primary) hypertension: Secondary | ICD-10-CM

## 2023-05-12 ENCOUNTER — Other Ambulatory Visit: Payer: Self-pay

## 2023-05-12 ENCOUNTER — Encounter: Payer: Self-pay | Admitting: Internal Medicine

## 2023-05-12 DIAGNOSIS — E782 Mixed hyperlipidemia: Secondary | ICD-10-CM

## 2023-05-12 MED ORDER — ROSUVASTATIN CALCIUM 40 MG PO TABS
ORAL_TABLET | ORAL | 3 refills | Status: DC
Start: 2023-05-12 — End: 2023-05-19

## 2023-05-19 ENCOUNTER — Other Ambulatory Visit: Payer: Self-pay

## 2023-05-19 MED ORDER — ROSUVASTATIN CALCIUM 20 MG PO TABS: ORAL_TABLET | ORAL | Status: DC

## 2023-05-19 NOTE — Progress Notes (Signed)
Patient received Rosuvastatin 40mg  instead of 20mg . Dosage change in Epic.

## 2023-06-14 ENCOUNTER — Other Ambulatory Visit: Payer: Self-pay | Admitting: Nurse Practitioner

## 2023-06-14 ENCOUNTER — Telehealth: Payer: Self-pay | Admitting: Nurse Practitioner

## 2023-06-14 MED ORDER — OLMESARTAN MEDOXOMIL 20 MG PO TABS: 20.0000 mg | ORAL_TABLET | Freq: Every day | ORAL | 1 refills | Status: DC

## 2023-06-14 NOTE — Telephone Encounter (Signed)
Patient is requesting a refill on Olmesartan but she would like it sent in for 20 mg. Instead of 40mg . Please send to [facility] on [address]

## 2023-07-23 ENCOUNTER — Other Ambulatory Visit: Payer: Self-pay | Admitting: Nurse Practitioner

## 2023-07-23 DIAGNOSIS — E039 Hypothyroidism, unspecified: Secondary | ICD-10-CM

## 2023-08-09 ENCOUNTER — Telehealth: Payer: Self-pay | Admitting: Nurse Practitioner

## 2023-08-09 MED ORDER — ROSUVASTATIN CALCIUM 20 MG PO TABS: ORAL_TABLET | ORAL | 3 refills | Status: DC

## 2023-08-09 NOTE — Telephone Encounter (Signed)
Requesting refill on rosuvastatin 20 MG pls. Send to  [id number] - [place], [place] - [address]

## 2023-08-09 NOTE — Addendum Note (Signed)
Addended by: Dionicio Stall on: 08/09/2023 09:24 AM   Modules accepted: Orders

## 2023-10-06 ENCOUNTER — Encounter: Payer: PPO | Admitting: Internal Medicine

## 2023-10-20 ENCOUNTER — Other Ambulatory Visit: Payer: Self-pay | Admitting: Nurse Practitioner

## 2023-10-20 DIAGNOSIS — E039 Hypothyroidism, unspecified: Secondary | ICD-10-CM

## 2023-10-27 ENCOUNTER — Other Ambulatory Visit: Payer: Self-pay | Admitting: Nurse Practitioner

## 2023-10-27 DIAGNOSIS — I10 Essential (primary) hypertension: Secondary | ICD-10-CM

## 2023-10-28 ENCOUNTER — Encounter: Payer: PPO | Admitting: Internal Medicine

## 2023-11-05 ENCOUNTER — Encounter: Payer: Self-pay | Admitting: Internal Medicine

## 2023-11-05 ENCOUNTER — Ambulatory Visit (INDEPENDENT_AMBULATORY_CARE_PROVIDER_SITE_OTHER): Payer: PPO | Admitting: Internal Medicine

## 2023-11-05 VITALS — BP 118/70 | HR 57 | Temp 97.9°F | Resp 16 | Ht 63.0 in | Wt 145.6 lb

## 2023-11-05 DIAGNOSIS — E559 Vitamin D deficiency, unspecified: Secondary | ICD-10-CM | POA: Diagnosis not present

## 2023-11-05 DIAGNOSIS — Z87891 Personal history of nicotine dependence: Secondary | ICD-10-CM

## 2023-11-05 DIAGNOSIS — R7309 Other abnormal glucose: Secondary | ICD-10-CM | POA: Diagnosis not present

## 2023-11-05 DIAGNOSIS — Z79899 Other long term (current) drug therapy: Secondary | ICD-10-CM

## 2023-11-05 DIAGNOSIS — Z136 Encounter for screening for cardiovascular disorders: Secondary | ICD-10-CM

## 2023-11-05 DIAGNOSIS — Z1211 Encounter for screening for malignant neoplasm of colon: Secondary | ICD-10-CM

## 2023-11-05 DIAGNOSIS — K219 Gastro-esophageal reflux disease without esophagitis: Secondary | ICD-10-CM | POA: Diagnosis not present

## 2023-11-05 DIAGNOSIS — E039 Hypothyroidism, unspecified: Secondary | ICD-10-CM | POA: Diagnosis not present

## 2023-11-05 DIAGNOSIS — Z8249 Family history of ischemic heart disease and other diseases of the circulatory system: Secondary | ICD-10-CM

## 2023-11-05 DIAGNOSIS — Z0000 Encounter for general adult medical examination without abnormal findings: Secondary | ICD-10-CM | POA: Diagnosis not present

## 2023-11-05 DIAGNOSIS — Z0001 Encounter for general adult medical examination with abnormal findings: Secondary | ICD-10-CM

## 2023-11-05 DIAGNOSIS — E782 Mixed hyperlipidemia: Secondary | ICD-10-CM | POA: Diagnosis not present

## 2023-11-05 DIAGNOSIS — R0989 Other specified symptoms and signs involving the circulatory and respiratory systems: Secondary | ICD-10-CM | POA: Diagnosis not present

## 2023-11-05 NOTE — Patient Instructions (Addendum)
 Due to recent changes in healthcare laws, you may see the results of your imaging and laboratory studies on MyChart before your provider has had a chance to review them.  We understand that in some cases there may be results that are confusing or concerning to you. Not all laboratory results come back in the same time frame and the provider may be waiting for multiple results in order to interpret others.  Please give [place] 48 hours in order for your provider to thoroughly review all the results before contacting the office for clarification of your results.   +++++++++++++++++++++++++  Vit D  & Vit C 1,000 mg   are recommended to help protect  against the Covid-19 and other Corona viruses.    Also it's recommended  to take  Zinc 50 mg x 1/2 tablet  =  25 mg  / Daily  to help  protect against the Covid-19   and best place to get  is also on [email or web address]  and don't pay more than 6-8 cents /pill !  ================================ Coronavirus (COVID-19) Are you at risk?  Are you at risk for the Coronavirus (COVID-19)?  To be considered HIGH RISK for Coronavirus (COVID-19), you have to meet the following criteria:  Traveled to [place], [place], [place], [place] or [place]; or in the [place] to [place], [place], [place]  or [place]; and have fever, cough, and shortness of breath within the last 2 weeks of travel OR Been in close contact with a person diagnosed with COVID-19 within the last 2 weeks and have  fever, cough,and shortness of breath  IF YOU DO NOT MEET THESE CRITERIA, YOU ARE CONSIDERED LOW RISK FOR COVID-19.  What to do if you are HIGH RISK for COVID-19?  If you are having a medical emergency, call 911. Seek medical care right away. Before you go to a doctor's office, urgent care or emergency department,  call ahead and tell them about your recent travel, contact with someone diagnosed with COVID-19   and your symptoms.  You should receive instructions from your  physician's office regarding next steps of care.  When you arrive at healthcare provider, tell the healthcare staff immediately you have returned from  visiting [place], [place], [place], [place] or [place]; or traveled in the [place] to [place], [place],  [place] or [place] in the last two weeks or you have been in close contact with a person diagnosed with  COVID-19 in the last 2 weeks.   Tell the health care staff about your symptoms: fever, cough and shortness of breath. After you have been seen by a medical provider, you will be either: Tested for (COVID-19) and discharged home on quarantine except to seek medical care if  symptoms worsen, and asked to  Stay home and avoid contact with others until you get your results (4-5 days)  Avoid travel on public transportation if possible (such as bus, train, or airplane) or Sent to the Emergency Department by EMS for evaluation, COVID-19 testing  and  possible admission depending on your condition and test results.  What to do if you are LOW RISK for COVID-19?  Reduce your risk of any infection by using the same precautions used for avoiding the common cold or flu:  Wash your hands often with soap and warm water for at least 20 seconds.  If soap and water are not readily available,  use an alcohol-based hand sanitizer with at least 60% alcohol.  If coughing or sneezing, cover your mouth and nose by coughing or sneezing into the elbow areas of your shirt or coat,  into a tissue or into your sleeve (not your hands). Avoid shaking hands with others and consider head nods or verbal greetings only. Avoid touching your eyes, nose, or mouth with unwashed hands.  Avoid close contact with people who are sick. Avoid places or events with large numbers of people in one location, like concerts or sporting events. Carefully consider travel plans you have or are making. If you are planning any travel outside or inside the [place], visit the CDC's  Travelers' Health webpage for the latest health notices. If you have some symptoms but not all symptoms, continue to monitor at home and seek medical attention  if your symptoms worsen. If you are having a medical emergency, call 911.   >>>>>>>>>>>>>>>>>>>>>>>>>>>>>>>>>  We Do NOT Approve of [employer] > > > > > > > > > > > > > > > > > > > > > > > > > > > > > > > > > > > > > > >  Preventive Care for Adults  A healthy lifestyle and preventive care can promote health and wellness. Preventive health guidelines for women include the following key practices. A routine yearly physical is a good way to check with your health care provider about your health and preventive screening. It is a chance to share any concerns and updates on your health and to receive a thorough exam. Visit your dentist for a routine exam and preventive care every 6 months. Brush your teeth twice a day and floss once a day. Good oral hygiene prevents tooth decay and gum disease. The frequency of eye exams is based on your age, health, family medical history, use of contact lenses, and other factors. Follow your health care provider's recommendations for frequency of eye exams. Eat a healthy diet. Foods like vegetables, fruits, whole grains, low-fat dairy products, and lean protein foods contain the nutrients you need without too many calories. Decrease your intake of foods high in solid fats, added sugars, and salt. Eat the right amount of calories for you. Get information about a proper diet from your health care provider, if necessary. Regular physical exercise is one of the most important things you can do for your health. Most adults should get at least 150 minutes of moderate-intensity exercise (any activity that increases your heart rate and causes you to sweat) each week. In addition, most adults need muscle-strengthening exercises on 2 or more days a week. Maintain a healthy weight. The body mass index (BMI) is a  screening tool to identify possible weight problems. It provides an estimate of body fat based on height and weight. Your health care provider can find your BMI and can help you achieve or maintain a healthy weight. For adults 20 years and older: A BMI below 18.5 is considered underweight. A BMI of 18.5 to 24.9 is normal. A BMI of 25 to 29.9 is considered overweight. A BMI of 30 and above is considered obese. Maintain normal blood lipids and cholesterol levels by exercising and minimizing your intake of saturated fat. Eat a balanced diet with plenty of fruit and vegetables. If your lipid or cholesterol levels are high, you are over 50, or you are at high risk for heart disease, you may need your cholesterol levels checked more frequently. Ongoing high lipid and cholesterol levels should be treated with medicines if diet  and exercise are not working. If you smoke, find out from your health care provider how to quit. If you do not use tobacco, do not start. Lung cancer screening is recommended for adults aged 55-80 years who are at high risk for developing lung cancer because of a history of smoking. A yearly low-dose CT scan of the lungs is recommended for people who have at least a 30-pack-year history of smoking and are a current smoker or have quit within the past 15 years. A pack year of smoking is smoking an average of 1 pack of cigarettes a day for 1 year (for example: 1 pack a day for 30 years or 2 packs a day for 15 years). Yearly screening should continue until the smoker has stopped smoking for at least 15 years. Yearly screening should be stopped for people who develop a health problem that would prevent them from having lung cancer treatment. Avoid use of street drugs. Do not share needles with anyone. Ask for help if you need support or instructions about stopping the use of drugs. High blood pressure causes heart disease and increases the risk of stroke.  Ongoing high blood pressure should be  treated with medicines if weight loss and exercise do not work. If you are 31-59 years old, ask your health care provider if you should take aspirin to prevent strokes. Diabetes screening involves taking a blood sample to check your fasting blood sugar level. This should be done once every 3 years, after age 53, if you are within normal weight and without risk factors for diabetes. Testing should be considered at a younger age or be carried out more frequently if you are overweight and have at least 1 risk factor for diabetes. Breast cancer screening is essential preventive care for women. You should practice "breast self-awareness." This means understanding the normal appearance and feel of your breasts and may include breast self-examination. Any changes detected, no matter how small, should be reported to a health care provider. Women in their 89s and 30s should have a clinical breast exam (CBE) by a health care provider as part of a regular health exam every 1 to 3 years. After age 60, women should have a CBE every year. Starting at age 69, women should consider having a mammogram (breast X-ray test) every year. Women who have a family history of breast cancer should talk to their health care provider about genetic screening. Women at a high risk of breast cancer should talk to their health care providers about having an MRI and a mammogram every year. Breast cancer gene (BRCA)-related cancer risk assessment is recommended for women who have family members with BRCA-related cancers. BRCA-related cancers include breast, ovarian, tubal, and peritoneal cancers. Having family members with these cancers may be associated with an increased risk for harmful changes (mutations) in the breast cancer genes BRCA1 and BRCA2. Results of the assessment will determine the need for genetic counseling and BRCA1 and BRCA2 testing. Routine pelvic exams to screen for cancer are no longer recommended for nonpregnant women who  are considered low risk for cancer of the pelvic organs (ovaries, uterus, and vagina) and who do not have symptoms. Ask your health care provider if a screening pelvic exam is right for you. If you have had past treatment for cervical cancer or a condition that could lead to cancer, you need Pap tests and screening for cancer for at least 20 years after your treatment. If Pap tests have been discontinued, your risk  factors (such as having a new sexual partner) need to be reassessed to determine if screening should be resumed. Some women have medical problems that increase the chance of getting cervical cancer. In these cases, your health care provider may recommend more frequent screening and Pap tests.  Colorectal cancer can be detected and often prevented. Most routine colorectal cancer screening begins at the age of 60 years and continues through age 77 years. However, your health care provider may recommend screening at an earlier age if you have risk factors for colon cancer. On a yearly basis, your health care provider may provide home test kits to check for hidden blood in the stool. Use of a small camera at the end of a tube, to directly examine the colon (sigmoidoscopy or colonoscopy), can detect the earliest forms of colorectal cancer. Talk to your health care provider about this at age 32, when routine screening begins.  Direct exam of the colon should be repeated every 5-10 years through age 75 years, unless early forms of pre-cancerous polyps or small growths are found. Osteoporosis is a disease in which the bones lose minerals and strength with aging. This can result in serious bone fractures or breaks. The risk of osteoporosis can be identified using a bone density scan. Women ages 55 years and over and women at risk for fractures or osteoporosis should discuss screening with their health care providers. Ask your health care provider whether you should take a calcium supplement or vitamin D to  reduce the rate of osteoporosis. Menopause can be associated with physical symptoms and risks. Hormone replacement therapy is available to decrease symptoms and risks. You should talk to your health care provider about whether hormone replacement therapy is right for you. Use sunscreen. Apply sunscreen liberally and repeatedly throughout the day. You should seek shade when your shadow is shorter than you. Protect yourself by wearing long sleeves, pants, a wide-brimmed hat, and sunglasses year round, whenever you are outdoors. Once a month, do a whole body skin exam, using a mirror to look at the skin on your back. Tell your health care provider of new moles, moles that have irregular borders, moles that are larger than a pencil eraser, or moles that have changed in shape or color. Stay current with required vaccines (immunizations). Influenza vaccine. All adults should be immunized every year. Tetanus, diphtheria, and acellular pertussis (Td, Tdap) vaccine. Pregnant women should receive 1 dose of Tdap vaccine during each pregnancy. The dose should be obtained regardless of the length of time since the last dose. Immunization is preferred during the 27th-36th week of gestation. An adult who has not previously received Tdap or who does not know her vaccine status should receive 1 dose of Tdap. This initial dose should be followed by tetanus and diphtheria toxoids (Td) booster doses every 10 years. Adults with an unknown or incomplete history of completing a 3-dose immunization series with Td-containing vaccines should begin or complete a primary immunization series including a Tdap dose. Adults should receive a Td booster every 10 years.  Zoster vaccine. One dose is recommended for adults aged 64 years or older unless certain conditions are present.  Pneumococcal 13-valent conjugate (PCV13) vaccine. When indicated, a person who is uncertain of her immunization history and has no record of immunization should  receive the PCV13 vaccine. An adult aged 32 years or older who has certain medical conditions and has not been previously immunized should receive 1 dose of PCV13 vaccine. This PCV13 should be  followed with a dose of pneumococcal polysaccharide (PPSV23) vaccine. The PPSV23 vaccine dose should be obtained at least 1 or more year(s) after the dose of PCV13 vaccine. An adult aged 20 years or older who has certain medical conditions and previously received 1 or more doses of PPSV23 vaccine should receive 1 dose of PCV13. The PCV13 vaccine dose should be obtained 1 or more years after the last PPSV23 vaccine dose.  Pneumococcal polysaccharide (PPSV23) vaccine. When PCV13 is also indicated, PCV13 should be obtained first. All adults aged 25 years and older should be immunized. An adult younger than age 48 years who has certain medical conditions should be immunized. Any person who resides in a nursing home or long-term care facility should be immunized. An adult smoker should be immunized. People with an immunocompromised condition and certain other conditions should receive both PCV13 and PPSV23 vaccines. People with human immunodeficiency virus (HIV) infection should be immunized as soon as possible after diagnosis. Immunization during chemotherapy or radiation therapy should be avoided. Routine use of PPSV23 vaccine is not recommended for American Indians, [address], or people younger than 65 years unless there are medical conditions that require PPSV23 vaccine. When indicated, people who have unknown immunization and have no record of immunization should receive PPSV23 vaccine. One-time revaccination 5 years after the first dose of PPSV23 is recommended for people aged 19-64 years who have chronic kidney failure, nephrotic syndrome, asplenia, or immunocompromised conditions. People who received 1-2 doses of PPSV23 before age 14 years should receive another dose of PPSV23 vaccine at age 33 years or later if at  least 5 years have passed since the previous dose. Doses of PPSV23 are not needed for people immunized with PPSV23 at or after age 65 years.  Preventive Services / Frequency  Ages 65 years and over Blood pressure check. Lipid and cholesterol check. Lung cancer screening. / Every year if you are aged 55-80 years and have a 30-pack-year history of smoking and currently smoke or have quit within the past 15 years. Yearly screening is stopped once you have quit smoking for at least 15 years or develop a health problem that would prevent you from having lung cancer treatment. Clinical breast exam.** / Every year after age 23 years.  BRCA-related cancer risk assessment.** / For women who have family members with a BRCA-related cancer (breast, ovarian, tubal, or peritoneal cancers). Mammogram.** / Every year beginning at age 85 years and continuing for as long as you are in good health. Consult with your health care provider. Pap test.** / Every 3 years starting at age 38 years through age 91 or 4 years with 3 consecutive normal Pap tests. Testing can be stopped between 65 and 70 years with 3 consecutive normal Pap tests and no abnormal Pap or HPV tests in the past 10 years. Fecal occult blood test (FOBT) of stool. / Every year beginning at age 93 years and continuing until age 28 years. You may not need to do this test if you get a colonoscopy every 10 years. Flexible sigmoidoscopy or colonoscopy.** / Every 5 years for a flexible sigmoidoscopy or every 10 years for a colonoscopy beginning at age 27 years and continuing until age 83 years. Hepatitis C blood test.** / For all people born from [birth year] through 1966 and any individual with known risks for hepatitis C. Osteoporosis screening.** / A one-time screening for women ages 55 years and over and women at risk for fractures or osteoporosis. Skin self-exam. / Monthly. Influenza  vaccine. / Every year. Tetanus, diphtheria, and acellular pertussis (Tdap/Td)  vaccine.** / 1 dose of Td every 10 years. Zoster vaccine.** / 1 dose for adults aged 10 years or older. Pneumococcal 13-valent conjugate (PCV13) vaccine.** / Consult your health care provider. Pneumococcal polysaccharide (PPSV23) vaccine.** / 1 dose for all adults aged 59 years and older. Screening for abdominal aortic aneurysm (AAA)  by ultrasound is recommended for people who have history of high blood pressure or who are current or former smokers. ++++++++++++++++++++ Recommend Adult Low Dose Aspirin or  coated  Aspirin 81 mg daily  To reduce risk of Colon Cancer 40 %,  Skin Cancer 26 % ,  Melanoma 46%  and  Pancreatic cancer 60% ++++++++++++++++++++ Vitamin D goal  is between 70-100.  Please make sure that you are taking your Vitamin D as directed.  It is very important as a natural anti-inflammatory  helping hair, skin, and nails, as well as reducing stroke and heart attack risk.  It helps your bones and helps with mood. It also decreases numerous cancer risks so please take it as directed.  Low Vit D is associated with a 200-300% higher risk for CANCER  and 200-300% higher risk for HEART   ATTACK  &  STROKE.   .....................................Marland Kitchen It is also associated with higher death rate at younger ages,  autoimmune diseases like Rheumatoid arthritis, Lupus, Multiple Sclerosis.    Also many other serious conditions, like depression, Alzheimer's Dementia, infertility, muscle aches, fatigue, fibromyalgia - just to name a few. ++++++++++++++++++ Recommend the book "The END of DIETING" by Dr Monico Hoar  & the book "The END of DIABETES " by Dr Monico Hoar At [email or web address] - get book & Audio CD's    Being diabetic has a  300% increased risk for heart attack, stroke, cancer, and alzheimer- type vascular dementia. It is very important that you work harder with diet by avoiding all foods that are white. Avoid white rice (brown & wild rice is OK), white potatoes (sweetpotatoes in  moderation is OK), White bread or wheat bread or anything made out of white flour like bagels, donuts, rolls, buns, biscuits, cakes, pastries, cookies, pizza crust, and pasta (made from white flour & egg whites) - vegetarian pasta or spinach or wheat pasta is OK. Multigrain breads like Arnold's or [employer], or multigrain sandwich thins or flatbreads.  Diet, exercise and weight loss can reverse and cure diabetes in the early stages.  Diet, exercise and weight loss is very important in the control and prevention of complications of diabetes which affects every system in your body, ie. Brain - dementia/stroke, eyes - glaucoma/blindness, heart - heart attack/heart failure, kidneys - dialysis, stomach - gastric paralysis, intestines - malabsorption, nerves - severe painful neuritis, circulation - gangrene & loss of a leg(s), and finally cancer and Alzheimers.    I recommend avoid fried & greasy foods,  sweets/candy, white rice (brown or wild rice or Quinoa is OK), white potatoes (sweet potatoes are OK) - anything made from white flour - bagels, doughnuts, rolls, buns, biscuits,white and wheat breads, pizza crust and traditional pasta made of white flour & egg white(vegetarian pasta or spinach or wheat pasta is OK).  Multi-grain bread is OK - like multi-grain flat bread or sandwich thins. Avoid alcohol in excess. Exercise is also important.    Eat all the vegetables you want - avoid meat, especially red meat and dairy - especially cheese.  Cheese is the most concentrated form of trans-fats which is  the worst thing to clog up our arteries. Veggie cheese is OK which can be found in the fresh produce section at [employer] or [employer] or [employer]  +++++++++++++++++++ DASH Eating Plan  DASH stands for "Dietary Approaches to Stop Hypertension."   The DASH eating plan is a healthy eating plan that has been shown to reduce high blood pressure (hypertension). Additional health benefits may include reducing  the risk of type 2 diabetes mellitus, heart disease, and stroke. The DASH eating plan may also help with weight loss. WHAT DO I NEED TO KNOW ABOUT THE DASH EATING PLAN? For the DASH eating plan, you will follow these general guidelines: Choose foods with a percent daily value for sodium of less than 5% (as listed on the food label). Use salt-free seasonings or herbs instead of table salt or sea salt. Check with your health care provider or pharmacist before using salt substitutes. Eat lower-sodium products, often labeled as "lower sodium" or "no salt added." Eat fresh foods. Eat more vegetables, fruits, and low-fat dairy products. Choose whole grains. Look for the word "whole" as the first word in the ingredient list. Choose fish  Limit sweets, desserts, sugars, and sugary drinks. Choose heart-healthy fats. Eat veggie cheese  Eat more home-cooked food and less restaurant, buffet, and fast food. Limit fried foods. Cook foods using methods other than frying. Limit canned vegetables. If you do use them, rinse them well to decrease the sodium. When eating at a restaurant, ask that your food be prepared with less salt, or no salt if possible.                      WHAT FOODS CAN I EAT? Read Dr Francis Dowse Fuhrman's books on The End of Dieting & The End of Diabetes  Grains Whole grain or whole wheat bread. Brown rice. Whole grain or whole wheat pasta. Quinoa, bulgur, and whole grain cereals. Low-sodium cereals. Corn or whole wheat flour tortillas. Whole grain cornbread. Whole grain crackers. Low-sodium crackers.  Vegetables Fresh or frozen vegetables (raw, steamed, roasted, or grilled). Low-sodium or reduced-sodium tomato and vegetable juices. Low-sodium or reduced-sodium tomato sauce and paste. Low-sodium or reduced-sodium canned vegetables.   Fruits All fresh, canned (in natural juice), or frozen fruits.  Protein Products  All fish and seafood.  Dried beans, peas, or lentils. Unsalted nuts and  seeds. Unsalted canned beans.  Dairy Low-fat dairy products, such as skim or 1% milk, 2% or reduced-fat cheeses, low-fat ricotta or cottage cheese, or plain low-fat yogurt. Low-sodium or reduced-sodium cheeses.  Fats and Oils Tub margarines without trans fats. Light or reduced-fat mayonnaise and salad dressings (reduced sodium). Avocado. Safflower, olive, or canola oils. Natural peanut or almond butter.  Other Unsalted popcorn and pretzels. The items listed above may not be a complete list of recommended foods or beverages. Contact your dietitian for more options.  +++++++++++++++  WHAT FOODS ARE NOT RECOMMENDED? Grains/ White flour or wheat flour White bread. White pasta. White rice. Refined cornbread. Bagels and croissants. Crackers that contain trans fat.  Vegetables  Creamed or fried vegetables. Vegetables in a . Regular canned vegetables. Regular canned tomato sauce and paste. Regular tomato and vegetable juices.  Fruits Dried fruits. Canned fruit in light or heavy syrup. Fruit juice.  Meat and Other Protein Products Meat in general - RED meat & White meat.  Fatty cuts of meat. Ribs, chicken wings, all processed meats as bacon, sausage, bologna, salami, fatback, hot dogs, bratwurst and packaged luncheon  meats.  Dairy Whole or 2% milk, cream, half-and-half, and cream cheese. Whole-fat or sweetened yogurt. Full-fat cheeses or blue cheese. Non-dairy creamers and whipped toppings. Processed cheese, cheese spreads, or cheese curds.  Condiments Onion and garlic salt, seasoned salt, table salt, and sea salt. Canned and packaged gravies. Worcestershire sauce. Tartar sauce. Barbecue sauce. Teriyaki sauce. Soy sauce, including reduced sodium. Steak sauce. Fish sauce. Oyster sauce. Cocktail sauce. Horseradish. Ketchup and mustard. Meat flavorings and tenderizers. Bouillon cubes. Hot sauce. Tabasco sauce. Marinades. Taco seasonings. Relishes.  Fats and Oils Butter, stick margarine, lard,  shortening and bacon fat. Coconut, palm kernel, or palm oils. Regular salad dressings.  Pickles and olives. Salted popcorn and pretzels.  The items listed above may not be a complete list of foods and beverages to avoid.

## 2023-11-05 NOTE — Progress Notes (Signed)
 [place]      ADULT   &   ADOLESCENT      INTERNAL MEDICINE  Sarah Hartman, M.D.          Lonell Rous, ANP        Bascom Necessary, FNP  [facility] [address]  [place], [place]. [address] Telephone [phone] Telefax [phone]   Annual Screening/Preventative Visit & Comprehensive Evaluation &  Examination  Future Appointments  Date Time Provider Department  11/05/2023                   cpe 11:00 AM Hartman Elsie, MD [facility]  01/28/2024                  wellness 11:00 AM Necessary Bascom, NP [facility]  11/15/2024                    cpe 11:00 AM Hartman Elsie, MD [facility]        This very nice 73 y.o. DWF with labile HTN, HLD, Hypothyroidism, Prediabetes  and Vitamin D  Deficiency   presents for a Screening /Preventative Visit & comprehensive evaluation and management of multiple medical co-morbidities.   Patient also has GERD  controlled with diet & her meds.          Labile  HTN has been monitored expectantly w/o meds  since  2007. Patient's BP has  not been monitored  at home and patient denies any cardiac symptoms as chest pain, palpitations, shortness of breath, dizziness or ankle swelling. Today's BP is at goal -  118/70 .        Patient's hyperlipidemia is controlled with diet and Rosuvastatin  .   Patient denies myalgias or other medication SE's. Last lipids were at goal except elevated Trig's :  Lab Results  Component Value Date   CHOL 142 04/28/2023   HDL 56 04/28/2023   LDLCALC 56 04/28/2023   TRIG 244 (H) 04/28/2023   CHOLHDL 2.5 04/28/2023         Patient has hx/o prediabetes (A1c 5.7% /2012) and patient denies reactive hypoglycemic symptoms, visual blurring, diabetic polys or paresthesias. Last A1c was near goal :  Lab Results  Component Value Date   HGBA1C 5.7 (H) 04/28/2023         Finally, patient has history of Vitamin D  Deficiency (A1c 5.7% /2012) and last Vitamin D  was at goal :  Lab Results   Component Value Date   VD25OH 66 04/28/2023       Current Outpatient Medications  Medication Instructions   acyclovir  (ZOVIRAX ) 400 MG tablet Take  1 cap 3 x /day  w/Meals  &  2 caps  at Bedtime    famotidine   40 MG tablet TAKE 1 TABLET  AT BEDTIME   levothyroxine  50 MCG tablet TAKE 1 TABLET  DAILY   mometasone (ELOCON) 0.1 % cream 1 Apply Topical, As needed   PRESERVISION AREDS 2  Daily   pantoprazole   40 MG tablet Take  1 tablet  Daily   rosuvastatin  20 MG tablet Take  1 tablet  every other Day    Vitamin D3 5,000 Units, Daily    Allergies  Allergen Reactions   Codeine Nausea And Vomiting and Other    Migraine     Past Medical History:  Diagnosis Date   Adenomatous colon polyp    Allergy    GERD (gastroesophageal reflux disease)    Heart murmur    history of as  teenager    IBS (irritable bowel syndrome)    Labile hypertension    Macular degeneration of left eye    RLS (restless legs syndrome)    Thyroid  disease    hypothyroid   Vitamin D  deficiency      Health Maintenance  Topic Date Due   Zoster Vaccines- Shingrix (1 of 2) Never done   COVID-19 Vaccine (4 - Booster for Moderna series) 10/27/2020   INFLUENZA VACCINE  06/02/2021   MAMMOGRAM  04/02/2022   COLONOSCOPY  09/21/2022   TETANUS/TDAP  12/27/2028   Pneumonia Vaccine 26+ Years old  Completed   DEXA SCAN  Completed   Hepatitis C Screening  Completed   HPV VACCINES  Aged Out     Immunization History  Administered Date(s) Administered   Influenza, High Dose  08/01/2018, 07/07/2019   Influenza 07/29/2016, 09/01/2020   Moderna Sars-Covid-2 Vacc 11/29/2019, 12/27/2019, 09/01/2020   Pneumococcal - 13 08/19/2015   Pneumococcal - 23 11/09/2012, 08/01/2018   Td 12/27/2018   Tdap 10/18/2008     Colon - 09/21/2017 - Dr Abran - recc 5 year f/u  Last Colon  - 09/28/2022  - Dr Abran recc No F/U due to age   Last MGM - 04/14/2023 - Negative    Past Surgical History:  Procedure Laterality Date    COLONOSCOPY     POLYPECTOMY     TENDON RECONSTRUCTION Right 04/27/2013   Procedure: RIGHT ELBOW DEBRIDE/REPAIR/EXPLORE - LATERAL HUMERAL EPICONDYLE;  Surgeon: Toribio JULIANNA Chancy, MD;  Location: [facility];  Service: Orthopedics;  Laterality: Right;   TUBAL LIGATION       Family History  Problem Relation Age of Onset   Hypertension Father    Cerebral aneurysm Father    Alzheimer's disease Mother    Stomach cancer Paternal Grandmother 25   Colon cancer Neg Hx    Esophageal cancer Neg Hx    Rectal cancer Neg Hx      Social History   Tobacco Use   Smoking status: Former    Packs/day: 2.00    Years: 14.00    Pack years: 28.00    Types: Cigarettes    Start date: 11/02/1966    Quit date: 04/24/1981    Years since quitting: 40.4   Smokeless tobacco: Never  Vaping Use   Vaping Use: Never used  Substance Use Topics   Alcohol use: Yes    Alcohol/week: 3.0 standard drinks    Types: 3 Glasses of wine per week    Comment: occ   Drug use: No      ROS Constitutional: Denies fever, chills, weight loss/gain, headaches, insomnia,  night sweats, and change in appetite. Does c/o fatigue. Eyes: Denies redness, blurred vision, diplopia, discharge, itchy, watery eyes.  ENT: Denies discharge, congestion, post nasal drip, epistaxis, sore throat, earache, hearing loss, dental pain, Tinnitus, Vertigo, Sinus pain, snoring.  Cardio: Denies chest pain, palpitations, irregular heartbeat, syncope, dyspnea, diaphoresis, orthopnea, PND, claudication, edema Respiratory: denies cough, dyspnea, DOE, pleurisy, hoarseness, laryngitis, wheezing.  Gastrointestinal: Denies dysphagia, heartburn, reflux, water brash, pain, cramps, nausea, vomiting, bloating, diarrhea, constipation, hematemesis, melena, hematochezia, jaundice, hemorrhoids Genitourinary: Denies dysuria, frequency, urgency, nocturia, hesitancy, discharge, hematuria, flank pain Breast: Breast lumps, nipple discharge, bleeding.   Musculoskeletal: Denies arthralgia, myalgia, stiffness, Jt. Swelling, pain, limp, and strain/sprain. Denies falls. Skin: Denies puritis, rash, hives, warts, acne, eczema, changing in skin lesion Neuro: No weakness, tremor, incoordination, spasms, paresthesia, pain Psychiatric: Denies confusion, memory loss, sensory loss. Denies Depression. Endocrine: Denies  change in weight, skin, hair change, nocturia, and paresthesia, diabetic polys, visual blurring, hyper / hypo glycemic episodes.  Heme/Lymph: No excessive bleeding, bruising, enlarged lymph nodes.  Physical Exam  BP 118/70   Pulse (!) 57   Temp 97.9 F (36.6 C)   Resp 16   Ht 5' 3 (1.6 m)   Wt 145 lb 9.6 oz (66 kg)   SpO2 99%   BMI 25.79 kg/m   General Appearance: Well nourished, well groomed and in no apparent distress.  Eyes: PERRLA, EOMs, conjunctiva no swelling or erythema, normal fundi and vessels. Sinuses: No frontal/maxillary tenderness ENT/Mouth: EACs patent / TMs  nl. Nares clear without erythema, swelling, mucoid exudates. Oral hygiene is good. No erythema, swelling, or exudate. Tongue normal, non-obstructing. Tonsils not swollen or erythematous. Hearing normal.  Neck: Supple, thyroid  not palpable. No bruits, nodes or JVD. Respiratory: Respiratory effort normal.  BS equal and clear bilateral without rales, rhonci, wheezing or stridor. Cardio: Heart sounds are normal with regular rate and rhythm and no murmurs, rubs or gallops. Peripheral pulses are normal and equal bilaterally without edema. No aortic or femoral bruits. Chest: symmetric with normal excursions and percussion. Breasts: Deferred to [facility]. Abdomen: Flat, soft with bowel sounds active. Nontender, no guarding, rebound, hernias, masses, or organomegaly.  Lymphatics: Non tender without lymphadenopathy.  Genitourinary:  Musculoskeletal: Full ROM all peripheral extremities, joint stability, 5/5 strength, and normal gait. Skin: Warm and dry without rashes,  lesions, cyanosis, clubbing or  ecchymosis.  Neuro: Cranial nerves intact, reflexes equal bilaterally. Normal muscle tone, no cerebellar symptoms. Sensation intact.  Pysch: Alert and oriented X 3, normal affect, Insight and Judgment appropriate.    Assessment and Plan  1. Annual Preventative Screening Examination   2. Labile hypertension  - EKG 12-Lead - Urinalysis, Routine w reflex microscopic - Microalbumin / creatinine urine ratio - CBC with Differential/Platelet - COMPLETE METABOLIC PANEL WITH GFR - Magnesium - TSH   3. Hyperlipidemia, mixed  - EKG 12-Lead - Lipid panel - TSH   4. Abnormal glucose  - EKG 12-Lead - Hemoglobin A1c - Insulin , random   5. Vitamin D  deficiency  - VITAMIN D  25 Hydroxy    6. Hypothyroidism  - TSH   7. Gastroesophageal reflux disease  - CBC with Differential/Platelet   8. Screening for colorectal cancer  - POC Hemoccult Bld/Stl   9. Screening for heart disease  - EKG 12-Lead   10. FH: hypertension  - EKG 12-Lead   11. Former smoker (28 pack year history, quit 1982)  - EKG 12-Lead   12. Medication management  - Urinalysis, Routine w reflex microscopic - Microalbumin / creatinine urine ratio - POC Hemoccult Bld/Stl ( - CBC with Differential/Platelet - COMPLETE METABOLIC PANEL WITH GFR - Magnesium - Lipid panel - TSH - Hemoglobin A1c - Insulin , random - VITAMIN D  25 Hydroxyl        Patient was counseled in prudent diet to achieve/maintain BMI less than 25 for weight control, BP monitoring, regular exercise and medications. Discussed med's effects and SE's. Screening labs and tests as requested with regular follow-up as recommended. Over 40 minutes of exam, counseling, chart review and high complex critical decision making was performed.   Sarah JONETTA Richards, MD

## 2023-11-07 ENCOUNTER — Encounter: Payer: Self-pay | Admitting: Internal Medicine

## 2023-11-07 NOTE — Progress Notes (Signed)
 [] [] [] [] [] [] [] [] [] [] [] [] [] [] [] [] [] [] [] [] [] [] [] [] [] [] [] [] [] [] [] [] [] [] [] [] [] [] [] [] [] ][] [] [] [] [] [] [] [] [] [] [] [] [] [] [] [] [] [] [] [] [] [] [[] [] [] [] []   [] [] [] [] [] [] [] [] [] [] [] [] [] [] [] [] [] [] [] [] [] [] [] [] [] [] [] [] [] [] [] [] [] [] [] [] [] [] [] [] [] ][] [] [] [] [] [] [] [] [] [] [] [] [] [] [] [] [] [] [] [] [] [] [[] [] [] [] []   -Test results slightly outside the reference range are not unusual. If there is anything important, I will review this with you,  otherwise it is considered normal test values.  If you have further questions,  please do not hesitate to contact me at the office or via My Chart.   [] [] [] [] [] [] [] [] [] [] [] [] [] [] [] [] [] [] [] [] [] [] [] [] [] [] [] [] [] [] [] [] [] [] [] [] [] [] [] [] [] ][] [] [] [] [] [] [] [] [] [] [] [] [] [] [] [] [] [] [] [] [] [] [[] [] [] [] []   [] [] [] [] [] [] [] [] [] [] [] [] [] [] [] [] [] [] [] [] [] [] [] [] [] [] [] [] [] [] [] [] [] [] [] [] [] [] [] [] [] ][] [] [] [] [] [] [] [] [] [] [] [] [] [] [] [] [] [] [] [] [] [] [[] [] [] [] []   -  CBC shows mild anemia which is stable from 2 years ago   [] [] [] [] [] [] [] [] [] [] [] [] [] [] [] [] [] [] [] [] [] [] [] [] [] [] [] [] [] [] [] [] [] [] [] [] [] [] [] [] [] ][] [] [] [] [] [] [] [] [] [] [] [] [] [] [] [] [] [] [] [] [] [] [[] [] [] [] []   - A1c = 5.7%  is Blood sugar and A1c are     STILL   elevated in the borderline and                                                               early or pre-diabetes range which has the same   300% increased risk for heart attack, stroke, cancer and                                                     alzheimer- type vascular dementia as full blown diabetes.   But the good news is that diet, exercise with                                                               weight loss can cure the early diabetes at this point.   -   It is very important that you work harder with diet by                                          avoiding all foods that are white except chicken, fish & calliflower.  - Avoid white rice  (brown & wild rice is OK),   - Avoid white potatoes  (sweet potatoes in moderation is OK),   White bread or wheat bread or anything made out of                                                         white flour like bagels, donuts, rolls, buns, biscuits, cakes,  - pastries, cookies, pizza crust, and pasta (made from white flour & egg whites)   - vegetarian pasta or spinach or wheat pasta is OK.  - Multigrain breads like Arnold's, Pepperidge Farm or                                                                     multigrain sandwich  thins or high fiber breads like   Eureka bread or Dave's Killer breads that are 4 to 5 grams fiber per slice !  are best.    Diet, exercise and weight loss can reverse and cure diabetes in the early stages.    [] [] [] [] [] [] [] [] [] [] [] [] [] [] [] [] [] [] [] [] [] [] [] [] [] [] [] [] [] [] [] [] [] [] [] [] [] [] [] [] [] ][] [] [] [] [] [] [] [] [] [] [] [] [] [] [] [] [] [] [] [] [] [] [[] [] [] [] []   - Chol = 139  &  LDL = 60   - Both  Excellent   - Very low risk for Heart Attack  / Stroke  [] [] [] [] [] [] [] [] [] [] [] [] [] [] [] [] [] [] [] [] [] [] [] [] [] [] [] [] [] [] [] [] [] [] [] [] [] [] [] [] [] ][] [] [] [] [] [] [] [] [] [] [] [] [] [] [] [] [] [] [] [] [] [] [[] [] [] [] []   - Vitamin D  = 72 - Excellent - Please  Keep dose same   [] [] [] [] [] [] [] [] [] [] [] [] [] [] [] [] [] [] [] [] [] [] [] [] [] [] [] [] [] [] [] [] [] [] [] [] [] [] [] [] [] ][] [] [] [] [] [] [] [] [] [] [] [] [] [] [] [] [] [] [] [] [] [] [[] [] [] [] []   -  All Else - CBC - Kidneys - Electrolytes - Liver - Magnesium & Thyroid     - all  Normal / OK  [] [] [] [] [] [] [] [] [] [] [] [] [] [] [] [] [] [] [] [] [] [] [] [] [] [] [] [] [] [] [] [] [] [] [] [] [] [] [] [] [] ][] [] [] [] [] [] [] [] [] [] [] [] [] [] [] [] [] [] [] [] [] [] [[] [] [] [] [] 

## 2023-11-08 LAB — COMPLETE METABOLIC PANEL WITH GFR
AG Ratio: 1.8 (calc) (ref 1.0–2.5)
ALT: 22 U/L (ref 6–29)
AST: 23 U/L (ref 10–35)
Albumin: 4.5 g/dL (ref 3.6–5.1)
Alkaline phosphatase (APISO): 37 U/L (ref 37–153)
BUN: 19 mg/dL (ref 7–25)
CO2: 29 mmol/L (ref 20–32)
Calcium: 9.9 mg/dL (ref 8.6–10.4)
Chloride: 103 mmol/L (ref 98–110)
Creat: 0.94 mg/dL (ref 0.60–1.00)
Globulin: 2.5 g/dL (ref 1.9–3.7)
Glucose, Bld: 92 mg/dL (ref 65–99)
Potassium: 4.3 mmol/L (ref 3.5–5.3)
Sodium: 141 mmol/L (ref 135–146)
Total Bilirubin: 0.4 mg/dL (ref 0.2–1.2)
Total Protein: 7 g/dL (ref 6.1–8.1)
eGFR: 64 mL/min/{1.73_m2} (ref >=60)

## 2023-11-08 LAB — URINALYSIS, ROUTINE W REFLEX MICROSCOPIC
Bacteria, UA: NONE SEEN /HPF
Bilirubin Urine: NEGATIVE
Glucose, UA: NEGATIVE
Hyaline Cast: NONE SEEN /LPF
Ketones, ur: NEGATIVE
Leukocytes,Ua: NEGATIVE
Nitrite: NEGATIVE
Protein, ur: NEGATIVE
RBC / HPF: NONE SEEN /HPF (ref 0–2)
Specific Gravity, Urine: 1.014 (ref 1.001–1.035)
Squamous Epithelial / HPF: NONE SEEN /HPF (ref <=5)
WBC, UA: NONE SEEN /HPF (ref 0–5)
pH: 6 (ref 5.0–8.0)

## 2023-11-08 LAB — CBC WITH DIFFERENTIAL/PLATELET
Absolute Lymphocytes: 1680 {cells}/uL (ref 850–3900)
Absolute Monocytes: 326 {cells}/uL (ref 200–950)
Basophils Absolute: 29 {cells}/uL (ref 0–200)
Basophils Relative: 0.6 %
Eosinophils Absolute: 29 {cells}/uL (ref 15–500)
Eosinophils Relative: 0.6 %
HCT: 34.3 % — ABNORMAL LOW (ref 35.0–45.0)
Hemoglobin: 11.6 g/dL — ABNORMAL LOW (ref 11.7–15.5)
MCH: 30.9 pg (ref 27.0–33.0)
MCHC: 33.8 g/dL (ref 32.0–36.0)
MCV: 91.2 fL (ref 80.0–100.0)
MPV: 9.8 fL (ref 7.5–12.5)
Monocytes Relative: 6.8 %
Neutro Abs: 2736 {cells}/uL (ref 1500–7800)
Neutrophils Relative %: 57 %
Platelets: 314 10*3/uL (ref 140–400)
RBC: 3.76 10*6/uL — ABNORMAL LOW (ref 3.80–5.10)
RDW: 12.4 % (ref 11.0–15.0)
Total Lymphocyte: 35 %
WBC: 4.8 10*3/uL (ref 3.8–10.8)

## 2023-11-08 LAB — MICROALBUMIN / CREATININE URINE RATIO
Creatinine, Urine: 76 mg/dL (ref 20–275)
Microalb Creat Ratio: 4 mg/g{creat} (ref <30)
Microalb, Ur: 0.3 mg/dL

## 2023-11-08 LAB — LIPID PANEL
Cholesterol: 139 mg/dL (ref <200)
HDL: 59 mg/dL (ref >=50)
LDL Cholesterol (Calc): 60 mg/dL
Non-HDL Cholesterol (Calc): 80 mg/dL (ref <130)
Total CHOL/HDL Ratio: 2.4 (calc) (ref <5.0)
Triglycerides: 118 mg/dL (ref <150)

## 2023-11-08 LAB — VITAMIN D 25 HYDROXY (VIT D DEFICIENCY, FRACTURES): Vit D, 25-Hydroxy: 72 ng/mL (ref 30–100)

## 2023-11-08 LAB — MICROSCOPIC MESSAGE

## 2023-11-08 LAB — INSULIN, RANDOM: Insulin: 5.8 u[IU]/mL

## 2023-11-08 LAB — HEMOGLOBIN A1C
Hgb A1c MFr Bld: 5.7 %{Hb} — ABNORMAL HIGH (ref <5.7)
Mean Plasma Glucose: 117 mg/dL
eAG (mmol/L): 6.5 mmol/L

## 2023-11-08 LAB — TSH: TSH: 1.54 m[IU]/L (ref 0.40–4.50)

## 2023-11-08 LAB — MAGNESIUM: Magnesium: 2 mg/dL (ref 1.5–2.5)

## 2023-11-11 DIAGNOSIS — L920 Granuloma annulare: Secondary | ICD-10-CM | POA: Diagnosis not present

## 2023-12-08 ENCOUNTER — Other Ambulatory Visit: Payer: Self-pay | Admitting: Nurse Practitioner

## 2023-12-08 ENCOUNTER — Other Ambulatory Visit: Payer: Self-pay | Admitting: Internal Medicine

## 2023-12-08 DIAGNOSIS — I10 Essential (primary) hypertension: Secondary | ICD-10-CM

## 2023-12-12 ENCOUNTER — Other Ambulatory Visit: Payer: Self-pay | Admitting: Internal Medicine

## 2023-12-12 DIAGNOSIS — I10 Essential (primary) hypertension: Secondary | ICD-10-CM

## 2024-01-04 ENCOUNTER — Other Ambulatory Visit: Payer: Self-pay

## 2024-01-04 DIAGNOSIS — E039 Hypothyroidism, unspecified: Secondary | ICD-10-CM

## 2024-01-04 DIAGNOSIS — I10 Essential (primary) hypertension: Secondary | ICD-10-CM

## 2024-01-04 MED ORDER — LEVOTHYROXINE SODIUM 50 MCG PO TABS
ORAL_TABLET | ORAL | 0 refills | Status: DC
Start: 2024-01-04 — End: 2024-03-30

## 2024-01-04 MED ORDER — ROSUVASTATIN CALCIUM 20 MG PO TABS
ORAL_TABLET | ORAL | 3 refills | Status: AC
Start: 2024-01-04 — End: ?

## 2024-01-04 MED ORDER — OLMESARTAN MEDOXOMIL 20 MG PO TABS: 20.0000 mg | ORAL_TABLET | Freq: Every day | ORAL | 0 refills | Status: DC

## 2024-01-04 MED ORDER — BISOPROLOL FUMARATE 10 MG PO TABS: ORAL_TABLET | ORAL | 0 refills | Status: DC

## 2024-01-04 MED ORDER — HYDROCHLOROTHIAZIDE 25 MG PO TABS: ORAL_TABLET | ORAL | 0 refills | Status: DC

## 2024-01-06 ENCOUNTER — Encounter: Payer: Self-pay | Admitting: *Deleted

## 2024-01-28 ENCOUNTER — Ambulatory Visit: Payer: PPO | Admitting: Nurse Practitioner

## 2024-02-02 ENCOUNTER — Encounter: Payer: Self-pay | Admitting: Family Medicine

## 2024-02-02 ENCOUNTER — Ambulatory Visit (INDEPENDENT_AMBULATORY_CARE_PROVIDER_SITE_OTHER): Payer: PPO | Admitting: Family Medicine

## 2024-02-02 VITALS — BP 110/72 | HR 58 | Temp 97.6°F | Ht 63.0 in | Wt 148.0 lb

## 2024-02-02 DIAGNOSIS — G2581 Restless legs syndrome: Secondary | ICD-10-CM | POA: Diagnosis not present

## 2024-02-02 DIAGNOSIS — M79604 Pain in right leg: Secondary | ICD-10-CM

## 2024-02-02 DIAGNOSIS — E782 Mixed hyperlipidemia: Secondary | ICD-10-CM

## 2024-02-02 DIAGNOSIS — E039 Hypothyroidism, unspecified: Secondary | ICD-10-CM

## 2024-02-02 DIAGNOSIS — M8589 Other specified disorders of bone density and structure, multiple sites: Secondary | ICD-10-CM | POA: Diagnosis not present

## 2024-02-02 DIAGNOSIS — R7303 Prediabetes: Secondary | ICD-10-CM

## 2024-02-02 DIAGNOSIS — K219 Gastro-esophageal reflux disease without esophagitis: Secondary | ICD-10-CM

## 2024-02-02 DIAGNOSIS — E559 Vitamin D deficiency, unspecified: Secondary | ICD-10-CM | POA: Diagnosis not present

## 2024-02-02 NOTE — Patient Instructions (Signed)
 Thank you for trusting [place] with your health care.  You will receive a call for the ultrasound of your right leg.  Let me know if you have not heard from anyone in 2 weeks.

## 2024-02-02 NOTE — Progress Notes (Signed)
 New Patient Office Visit  Subjective    Patient ID: Sarah Hartman, female    DOB: 1950/11/28  Age: 73 y.o. MRN: [id number]  CC:  Chief Complaint  Patient presents with   Establish Care    Right leg redness, bothering at night, hot pad helps    HPI Sarah Hartman presents to establish care   Dermatologist Eye doctor- Hazle Quant in [facility]  Dentist  Gyn- [facility]- Dr. Marina Goodell    GERD- no issues lately. No medication   Osteopenia - last DEXA in 2022 takes vitamin D and exercises   Vitamin D - takes 5,000 IUs daily   On statin therapy   Hypothyroidism- levothyroxine 50 mcg   RLS - states this has been ongoing for years. During the day not as much of an issue. She wants to move her leg at night.  Right foot tingling for 2 weeks mainly pain in her right leg.  She has been using a topical medication that is no longer working. She has not wanted to take oral medication for RLS  Exercises 4-6 days per week   Coffee and wine at dinner sometimes.   Volunteers.  Lives alone. Widowed  Daughter age 20   Does not smoke. No drug use      01/26/2023   11:19 AM 10/26/2022   11:23 PM 01/08/2022    9:52 AM 09/29/2021   11:48 PM 12/25/2020   11:37 AM  Depression screen PHQ 2/9  Decreased Interest 0 0 0 0 0  Down, Depressed, Hopeless 0 0 0 0 0  PHQ - 2 Score 0 0 0 0 0      Outpatient Encounter Medications as of 02/02/2024  Medication Sig   bisoprolol (ZEBETA) 10 MG tablet TAKE 1/2 TO 1 (ONE-HALF TO ONE) TABLET BY MOUTH IN THE MORNING FOR BLOOD PRESSURE   Cholecalciferol (VITAMIN D3) 5000 units TABS Take 5,000 Units by mouth daily.   hydrochlorothiazide (HYDRODIURIL) 25 MG tablet TAKE 1 TABLET BY MOUTH ONCE DAILY FOR BLOOD PRESSURE AND FOR FLUID   levothyroxine (SYNTHROID) 50 MCG tablet TAKE 1 TABLET BY MOUTH ONCE DAILY ON AN EMPTY STOMACH *NO  ANTACID  MEDS,  CALCIUM,  OR  MAGNESIUM  FOR  30  MINUTES.  AVOID  BIOTIN**   Magnesium 500 MG TABS Take by mouth 2 (two)  times daily.   mometasone (ELOCON) 0.1 % cream Apply 1 Application topically as needed.   Multiple Vitamins-Minerals (PRESERVISION AREDS 2 PO) daily.   olmesartan (BENICAR) 20 MG tablet Take 1 tablet (20 mg total) by mouth daily.   rosuvastatin (CRESTOR) 20 MG tablet Take one tablet every other day.   UNABLE TO FIND Med Name: Magnalife cream   acyclovir (ZOVIRAX) 400 MG tablet Take  1 capsule  3 x /day  with Meals  &  2 capsules  at Bedtime for Mouth Ulcers (Patient not taking: Reported on 02/02/2024)   No facility-administered encounter medications on file as of 02/02/2024.    Past Medical History:  Diagnosis Date   Adenomatous colon polyp    Allergy    GERD (gastroesophageal reflux disease)    Heart murmur    history of as teenager    Hyperlipidemia    IBS (irritable bowel syndrome)    Labile hypertension    "white coat" per pt   Macular degeneration of left eye    RLS (restless legs syndrome)    Thyroid disease    hypothyroid   Vitamin D  deficiency     Past Surgical History:  Procedure Laterality Date   COLONOSCOPY     POLYPECTOMY     TENDON RECONSTRUCTION Right 04/27/2013   Procedure: RIGHT ELBOW DEBRIDE/REPAIR/EXPLORE - LATERAL HUMERAL EPICONDYLE;  Surgeon: Loreta Ave, MD;  Location: [facility];  Service: Orthopedics;  Laterality: Right;   TUBAL LIGATION      Family History  Problem Relation Age of Onset   Hypertension Father    Cerebral aneurysm Father    Alzheimer's disease Mother    Stomach cancer Paternal Grandmother 53   Colon cancer Neg Hx    Esophageal cancer Neg Hx    Rectal cancer Neg Hx     Social History   Socioeconomic History   Marital status: Single    Spouse name: Not on file   Number of children: Not on file   Years of education: Not on file   Highest education level: GED or equivalent  Occupational History   Not on file  Tobacco Use   Smoking status: Former    Current packs/day: 0.00    Average packs/day: 2.0 packs/day  for 14.5 years (28.9 ttl pk-yrs)    Types: Cigarettes    Start date: 11/02/1966    Quit date: 04/24/1981    Years since quitting: 42.8   Smokeless tobacco: Never  Vaping Use   Vaping status: Never Used  Substance and Sexual Activity   Alcohol use: Yes    Alcohol/week: 3.0 standard drinks of alcohol    Types: 3 Glasses of wine per week    Comment: occ   Drug use: No   Sexual activity: Not Currently    Birth control/protection: Post-menopausal, Surgical  Other Topics Concern   Not on file  Social History Narrative   Not on file   Social Drivers of Health   Financial Resource Strain: Low Risk  (01/31/2024)   Overall Financial Resource Strain (CARDIA)    Difficulty of Paying Living Expenses: Not very hard  Food Insecurity: Food Insecurity Present (01/31/2024)   Hunger Vital Sign    Worried About Running Out of Food in the Last Year: Sometimes true    Ran Out of Food in the Last Year: Never true  Transportation Needs: No Transportation Needs (01/31/2024)   PRAPARE - Administrator, Civil Service (Medical): No    Lack of Transportation (Non-Medical): No  Physical Activity: Sufficiently Active (01/31/2024)   Exercise Vital Sign    Days of Exercise per Week: 6 days    Minutes of Exercise per Session: 50 min  Stress: No Stress Concern Present (01/31/2024)   [employer] of Occupational Health - Occupational Stress Questionnaire    Feeling of Stress : Only a little  Social Connections: Moderately Isolated (01/31/2024)   Social Connection and Isolation Panel [NHANES]    Frequency of Communication with Friends and Family: More than three times a week    Frequency of Social Gatherings with Friends and Family: Once a week    Attends Religious Services: Never    Database administrator or Organizations: Yes    Attends Banker Meetings: 1 to 4 times per year    Marital Status: Divorced  Catering manager Violence: Not on file    Review of Systems   Constitutional:  Negative for chills, fever and malaise/fatigue.  Respiratory:  Negative for cough and shortness of breath.   Cardiovascular:  Negative for chest pain, palpitations, claudication and leg swelling.  Gastrointestinal:  Negative for  abdominal pain, constipation, diarrhea, nausea and vomiting.  Genitourinary:  Negative for dysuria, frequency and urgency.  Musculoskeletal:  Positive for joint pain and myalgias.  Neurological:  Positive for tingling. Negative for dizziness, focal weakness and headaches.  Psychiatric/Behavioral:  Negative for depression. The patient is nervous/anxious.         Objective    BP 110/72 (BP Location: Left Arm, Patient Position: Sitting)   Pulse (!) 58   Temp 97.6 F (36.4 C) (Temporal)   Ht 5\' 3"  (1.6 m)   Wt 148 lb (67.1 kg)   SpO2 98%   BMI 26.22 kg/m   Physical Exam Constitutional:      General: She is not in acute distress.    Appearance: She is not ill-appearing.  Eyes:     Extraocular Movements: Extraocular movements intact.     Conjunctiva/sclera: Conjunctivae normal.  Cardiovascular:     Rate and Rhythm: Normal rate and regular rhythm.  Pulmonary:     Effort: Pulmonary effort is normal.     Breath sounds: Normal breath sounds.  Musculoskeletal:        General: No tenderness.     Cervical back: Normal range of motion and neck supple.     Right lower leg: No edema.     Left lower leg: No edema.  Skin:    General: Skin is warm and dry.  Neurological:     General: No focal deficit present.     Mental Status: She is alert and oriented to person, place, and time.     Motor: No weakness.     Coordination: Coordination normal.     Gait: Gait normal.  Psychiatric:        Mood and Affect: Mood normal.        Behavior: Behavior normal.        Thought Content: Thought content normal.         Assessment & Plan:   Problem List Items Addressed This Visit     GERD (gastroesophageal reflux disease)   Hyperlipidemia,  mixed   Hypothyroidism   Osteopenia - Primary   RLS (restless legs syndrome)   Vitamin D deficiency   Other Visit Diagnoses       Right leg pain       Relevant Orders   VAS [place] ABI WITH/WO TBI     Prediabetes          Is a pleasant 73 year old female who is new to the practice and here to establish care. Reviewed chronic health conditions and medications.  Reviewed previous labs and notes from PCP and other specialists. VAS [place] right leg due to worsening pain with tingling. Discussed that RLS is typically bilateral  She will follow up for fasting CPE in July. AWV with nurse. She will need a DEXA to check on osteopenia.    Return for AWV with nurse. follow up with me for ov and labs in July .   Hetty Blend, NP-C

## 2024-02-23 ENCOUNTER — Ambulatory Visit (HOSPITAL_COMMUNITY)
Admission: RE | Admit: 2024-02-23 | Discharge: 2024-02-23 | Disposition: A | Discharge status: Home / Self Care | Source: Ambulatory Visit | Attending: Family Medicine | Admitting: Family Medicine

## 2024-02-23 DIAGNOSIS — M79604 Pain in right leg: Secondary | ICD-10-CM | POA: Insufficient documentation

## 2024-02-23 LAB — VAS US ABI WITH/WO TBI
Left ABI: 1.11
Right ABI: 1.13

## 2024-02-23 NOTE — Progress Notes (Signed)
 Her vascular ultrasound is normal.

## 2024-03-02 DIAGNOSIS — H2513 Age-related nuclear cataract, bilateral: Secondary | ICD-10-CM | POA: Diagnosis not present

## 2024-03-02 DIAGNOSIS — H43812 Vitreous degeneration, left eye: Secondary | ICD-10-CM | POA: Diagnosis not present

## 2024-03-02 DIAGNOSIS — H353131 Nonexudative age-related macular degeneration, bilateral, early dry stage: Secondary | ICD-10-CM | POA: Diagnosis not present

## 2024-03-02 DIAGNOSIS — D23122 Other benign neoplasm of skin of left lower eyelid, including canthus: Secondary | ICD-10-CM | POA: Diagnosis not present

## 2024-03-15 DIAGNOSIS — Z6827 Body mass index (BMI) 27.0-27.9, adult: Secondary | ICD-10-CM | POA: Diagnosis not present

## 2024-03-15 DIAGNOSIS — Z01419 Encounter for gynecological examination (general) (routine) without abnormal findings: Secondary | ICD-10-CM | POA: Diagnosis not present

## 2024-03-30 ENCOUNTER — Other Ambulatory Visit: Payer: Self-pay | Admitting: Family Medicine

## 2024-03-30 DIAGNOSIS — E039 Hypothyroidism, unspecified: Secondary | ICD-10-CM

## 2024-03-30 MED ORDER — LEVOTHYROXINE SODIUM 50 MCG PO TABS: ORAL_TABLET | ORAL | 0 refills | Status: DC

## 2024-03-30 NOTE — Telephone Encounter (Signed)
 Copied from CRM [id number]. Topic: Clinical - Medication Refill >> Mar 30, 2024  2:50 PM Juleen Oakland F wrote: Medication: levothyroxine  (SYNTHROID ) 50 MCG tablet   Has the patient contacted their pharmacy? Yes (Agent: If no, request that the patient contact the pharmacy for the refill. If patient does not wish to contact the pharmacy document the reason why and proceed with request.) (Agent: If yes, when and what did the pharmacy advise?)  This is the patient's preferred pharmacy:  [facility] [address], [place] - [address] [place] [address] Phone: [phone] Fax: [phone]  Is this the correct pharmacy for this prescription? Yes If no, delete pharmacy and type the correct one.   Has the prescription been filled recently? Yes  Is the patient out of the medication? Yes  Has the patient been seen for an appointment in the last year OR does the patient have an upcoming appointment? Yes  Can we respond through MyChart? No  Agent: Please be advised that Rx refills may take up to 3 business days. We ask that you follow-up with your pharmacy.

## 2024-03-30 NOTE — Telephone Encounter (Signed)
 Last Fill: 01/04/24  Last OV: 02/02/24 Next OV: 05/10/24  Routing to provider for review/authorization.

## 2024-03-31 ENCOUNTER — Other Ambulatory Visit: Payer: Self-pay | Admitting: Family

## 2024-03-31 DIAGNOSIS — M8588 Other specified disorders of bone density and structure, other site: Secondary | ICD-10-CM | POA: Diagnosis not present

## 2024-03-31 DIAGNOSIS — I10 Essential (primary) hypertension: Secondary | ICD-10-CM

## 2024-03-31 DIAGNOSIS — N958 Other specified menopausal and perimenopausal disorders: Secondary | ICD-10-CM | POA: Diagnosis not present

## 2024-04-19 DIAGNOSIS — Z1231 Encounter for screening mammogram for malignant neoplasm of breast: Secondary | ICD-10-CM | POA: Diagnosis not present

## 2024-04-19 LAB — HM MAMMOGRAPHY

## 2024-05-10 ENCOUNTER — Ambulatory Visit: Admitting: Family Medicine

## 2024-05-11 ENCOUNTER — Other Ambulatory Visit (INDEPENDENT_AMBULATORY_CARE_PROVIDER_SITE_OTHER)

## 2024-05-11 ENCOUNTER — Encounter: Payer: Self-pay | Admitting: Family Medicine

## 2024-05-11 ENCOUNTER — Ambulatory Visit (INDEPENDENT_AMBULATORY_CARE_PROVIDER_SITE_OTHER): Admitting: Family Medicine

## 2024-05-11 VITALS — BP 102/74 | HR 59 | Temp 97.6°F | Ht 63.0 in | Wt 151.0 lb

## 2024-05-11 DIAGNOSIS — E782 Mixed hyperlipidemia: Secondary | ICD-10-CM | POA: Diagnosis not present

## 2024-05-11 DIAGNOSIS — M8589 Other specified disorders of bone density and structure, multiple sites: Secondary | ICD-10-CM

## 2024-05-11 DIAGNOSIS — R7303 Prediabetes: Secondary | ICD-10-CM | POA: Diagnosis not present

## 2024-05-11 DIAGNOSIS — I10 Essential (primary) hypertension: Secondary | ICD-10-CM | POA: Diagnosis not present

## 2024-05-11 DIAGNOSIS — E559 Vitamin D deficiency, unspecified: Secondary | ICD-10-CM | POA: Diagnosis not present

## 2024-05-11 DIAGNOSIS — E039 Hypothyroidism, unspecified: Secondary | ICD-10-CM | POA: Diagnosis not present

## 2024-05-11 DIAGNOSIS — B001 Herpesviral vesicular dermatitis: Secondary | ICD-10-CM | POA: Insufficient documentation

## 2024-05-11 NOTE — Progress Notes (Signed)
 Subjective:     Patient ID: Sarah Hartman, female    DOB: 1951-07-06, 73 y.o.   MRN: [id number]  Chief Complaint  Patient presents with   Medical Management of Chronic Issues    HPI  History of Present Illness         She is here to follow-up on chronic health conditions.  This is her second visit with me since establishing care in April. Since then she had normal ABIs.  Dermatologist Eye doctor- [facility] in [facility]  Dentist  Gyn- [facility]- Dr. Abran    Hx of prediabetes and HTN    GERD- no issues lately. No medication     Vitamin D  - takes 5,000 IUs daily    On statin therapy    Hypothyroidism- levothyroxine  50 mcg   RLS not as noticeable since April.  She has not wanted to take oral medication for RLS   DEXA done at [facility] and showed osteopenia.  She consumes a lot of calcium  in her diet.  Taking vitamin D   Coffee and wine at dinner sometimes.    Volunteers.  Lives alone. Widowed  Daughter age 109    Does not smoke. No drug use     Health Maintenance Due  Topic Date Due   COVID-19 Vaccine (5 - Moderna risk 2024-25 season) 01/01/2024   Medicare Annual Wellness (AWV)  01/26/2024    Past Medical History:  Diagnosis Date   Adenomatous colon polyp    Allergy    GERD (gastroesophageal reflux disease)    Heart murmur    history of as teenager    Herpes labialis 05/11/2024   Hyperlipidemia    IBS (irritable bowel syndrome)    Labile hypertension    white coat per pt   Macular degeneration of left eye    RLS (restless legs syndrome)    Thyroid  disease    hypothyroid   Vitamin D  deficiency     Past Surgical History:  Procedure Laterality Date   COLONOSCOPY     POLYPECTOMY     TENDON RECONSTRUCTION Right 04/27/2013   Procedure: RIGHT ELBOW DEBRIDE/REPAIR/EXPLORE - LATERAL HUMERAL EPICONDYLE;  Surgeon: Toribio JULIANNA Chancy, MD;  Location: [facility];  Service: Orthopedics;  Laterality: Right;   TUBAL  LIGATION      Family History  Problem Relation Age of Onset   Hypertension Father    Cerebral aneurysm Father    Alzheimer's disease Mother    Stomach cancer Paternal Grandmother 81   Colon cancer Neg Hx    Esophageal cancer Neg Hx    Rectal cancer Neg Hx     Social History   Socioeconomic History   Marital status: Single    Spouse name: Not on file   Number of children: Not on file   Years of education: Not on file   Highest education level: GED or equivalent  Occupational History   Not on file  Tobacco Use   Smoking status: Former    Current packs/day: 0.00    Average packs/day: 2.0 packs/day for 14.5 years (28.9 ttl pk-yrs)    Types: Cigarettes    Start date: 11/02/1966    Quit date: 04/24/1981    Years since quitting: 43.0   Smokeless tobacco: Never  Vaping Use   Vaping status: Never Used  Substance and Sexual Activity   Alcohol use: Yes    Alcohol/week: 3.0 standard drinks of alcohol    Types: 3 Glasses of wine per  week    Comment: occ   Drug use: No   Sexual activity: Not Currently    Birth control/protection: Post-menopausal, Surgical  Other Topics Concern   Not on file  Social History Narrative   Not on file   Social Drivers of Health   Financial Resource Strain: Low Risk  (01/31/2024)   Overall Financial Resource Strain (CARDIA)    Difficulty of Paying Living Expenses: Not very hard  Food Insecurity: Food Insecurity Present (01/31/2024)   Hunger Vital Sign    Worried About Running Out of Food in the Last Year: Sometimes true    Ran Out of Food in the Last Year: Never true  Transportation Needs: No Transportation Needs (01/31/2024)   PRAPARE - Administrator, Civil Service (Medical): No    Lack of Transportation (Non-Medical): No  Physical Activity: Sufficiently Active (01/31/2024)   Exercise Vital Sign    Days of Exercise per Week: 6 days    Minutes of Exercise per Session: 50 min  Stress: No Stress Concern Present (01/31/2024)   [employer] of Occupational Health - Occupational Stress Questionnaire    Feeling of Stress : Only a little  Social Connections: Moderately Isolated (01/31/2024)   Social Connection and Isolation Panel    Frequency of Communication with Friends and Family: More than three times a week    Frequency of Social Gatherings with Friends and Family: Once a week    Attends Religious Services: Never    Database administrator or Organizations: Yes    Attends Engineer, structural: 1 to 4 times per year    Marital Status: Divorced  Catering manager Violence: Not on file    Outpatient Medications Prior to Visit  Medication Sig Dispense Refill   bisoprolol  (ZEBETA ) 10 MG tablet TAKE 1/2 TO 1 (ONE-HALF TO ONE) TABLET BY MOUTH IN THE MORNING FOR BLOOD PRESSURE 90 tablet 0   Cholecalciferol (VITAMIN D3) 5000 units TABS Take 5,000 Units by mouth daily.     hydrochlorothiazide  (HYDRODIURIL ) 25 MG tablet TAKE 1 TABLET BY MOUTH ONCE DAILY FOR BLOOD PRESSURE AND FOR FLUID 90 tablet 0   levothyroxine  (SYNTHROID ) 50 MCG tablet TAKE 1 TABLET BY MOUTH ONCE DAILY ON AN EMPTY STOMACH *NO  ANTACID  MEDS,  CALCIUM ,  OR  MAGNESIUM  FOR  30  MINUTES.  AVOID  BIOTIN** 90 tablet 0   Magnesium 500 MG TABS Take by mouth 2 (two) times daily.     mometasone (ELOCON) 0.1 % cream Apply 1 Application topically as needed.     Multiple Vitamins-Minerals (PRESERVISION AREDS 2 PO) daily.     olmesartan  (BENICAR ) 20 MG tablet Take 1 tablet (20 mg total) by mouth daily. 90 tablet 0   rosuvastatin  (CRESTOR ) 20 MG tablet Take one tablet every other day. 45 tablet 3   UNABLE TO FIND Med Name: Magnalife cream     acyclovir  (ZOVIRAX ) 400 MG tablet Take  1 capsule  3 x /day  with Meals  &  2 capsules  at Bedtime for Mouth Ulcers (Patient not taking: Reported on 05/11/2024) 50 tablet 3   No facility-administered medications prior to visit.    Allergies  Allergen Reactions   Codeine Nausea And Vomiting and Other (See Comments)     Migraine    Review of Systems  Constitutional:  Negative for chills, fever and malaise/fatigue.  Eyes:  Negative for blurred vision, double vision and photophobia.  Respiratory:  Negative for shortness of breath.  Cardiovascular:  Negative for chest pain, palpitations and leg swelling.  Gastrointestinal:  Negative for abdominal pain, constipation, diarrhea, nausea and vomiting.  Genitourinary:  Negative for dysuria, frequency and urgency.  Musculoskeletal:  Negative for back pain, falls and neck pain.  Neurological:  Negative for dizziness, focal weakness and headaches.  Psychiatric/Behavioral:  Negative for depression. The patient is not nervous/anxious.        Objective:    Physical Exam Constitutional:      General: She is not in acute distress.    Appearance: She is not ill-appearing.  Eyes:     Extraocular Movements: Extraocular movements intact.     Conjunctiva/sclera: Conjunctivae normal.  Cardiovascular:     Rate and Rhythm: Normal rate.  Pulmonary:     Effort: Pulmonary effort is normal.  Musculoskeletal:     Cervical back: Normal range of motion and neck supple.  Skin:    General: Skin is warm and dry.  Neurological:     General: No focal deficit present.     Mental Status: She is alert and oriented to person, place, and time.  Psychiatric:        Mood and Affect: Mood normal.        Behavior: Behavior normal.        Thought Content: Thought content normal.      BP 102/74 (BP Location: Left Arm, Patient Position: Sitting)   Pulse (!) 59   Temp 97.6 F (36.4 C) (Temporal)   Ht 5' 3 (1.6 m)   Wt 151 lb (68.5 kg)   SpO2 97%   BMI 26.75 kg/m  Wt Readings from Last 3 Encounters:  05/11/24 151 lb (68.5 kg)  02/02/24 148 lb (67.1 kg)  11/05/23 145 lb 9.6 oz (66 kg)       Assessment & Plan:   Problem List Items Addressed This Visit     Hyperlipidemia, mixed   Relevant Orders   Lipid panel (Completed)   Hypothyroidism   Relevant Orders   TSH  (Completed)   Osteopenia   Vitamin D  deficiency   Relevant Orders   VITAMIN D  25 Hydroxy (Vit-D Deficiency, Fractures) (Completed)   Other Visit Diagnoses       Prediabetes    -  Primary   Relevant Orders   CBC with Differential/Platelet (Completed)   Comprehensive metabolic panel with GFR (Completed)   Hemoglobin A1c (Completed)     Essential hypertension       Relevant Orders   CBC with Differential/Platelet (Completed)   Comprehensive metabolic panel with GFR (Completed)      Appears to be doing well with stable chronic health conditions. Due for labs including A1c, vitamin D , TSH and renal function to monitor her chronic health conditions. Follow-up pending lab results.  Refill medications as needed. I have asked her to schedule a fasting CPE and AWV with the nurse  I am having Sarah Hartman. Lahman maintain her Vitamin D3, Multiple Vitamins-Minerals (PRESERVISION AREDS 2 PO), mometasone, Magnesium, acyclovir , bisoprolol , hydrochlorothiazide , olmesartan , rosuvastatin , UNABLE TO FIND, and levothyroxine .  No orders of the defined types were placed in this encounter.

## 2024-05-11 NOTE — Patient Instructions (Addendum)
 Please go to [address] for labs.   The lab is in the basement.

## 2024-05-12 LAB — LIPID PANEL
Cholesterol: 139 mg/dL (ref 0–200)
HDL: 52.9 mg/dL (ref >39.00)
LDL Cholesterol: 52 mg/dL (ref 0–99)
NonHDL: 85.8
Total CHOL/HDL Ratio: 3
Triglycerides: 168 mg/dL — ABNORMAL HIGH (ref 0.0–149.0)
VLDL: 33.6 mg/dL (ref 0.0–40.0)

## 2024-05-12 LAB — CBC WITH DIFFERENTIAL/PLATELET
Basophils Absolute: 0 K/uL (ref 0.0–0.1)
Basophils Relative: 0.5 % (ref 0.0–3.0)
Eosinophils Absolute: 0 K/uL (ref 0.0–0.7)
Eosinophils Relative: 0.6 % (ref 0.0–5.0)
HCT: 34.8 % — ABNORMAL LOW (ref 36.0–46.0)
Hemoglobin: 12.1 g/dL (ref 12.0–15.0)
Lymphocytes Relative: 40.5 % (ref 12.0–46.0)
Lymphs Abs: 2.5 K/uL (ref 0.7–4.0)
MCHC: 34.7 g/dL (ref 30.0–36.0)
MCV: 89.4 fl (ref 78.0–100.0)
Monocytes Absolute: 0.4 K/uL (ref 0.1–1.0)
Monocytes Relative: 5.8 % (ref 3.0–12.0)
Neutro Abs: 3.2 K/uL (ref 1.4–7.7)
Neutrophils Relative %: 52.6 % (ref 43.0–77.0)
Platelets: 291 K/uL (ref 150.0–400.0)
RBC: 3.89 Mil/uL (ref 3.87–5.11)
RDW: 12.7 % (ref 11.5–15.5)
WBC: 6.1 K/uL (ref 4.0–10.5)

## 2024-05-12 LAB — HEMOGLOBIN A1C: Hgb A1c MFr Bld: 5.8 % (ref 4.6–6.5)

## 2024-05-12 LAB — COMPREHENSIVE METABOLIC PANEL WITH GFR
ALT: 16 U/L (ref 0–35)
AST: 23 U/L (ref 0–37)
Albumin: 4.9 g/dL (ref 3.5–5.2)
Alkaline Phosphatase: 30 U/L — ABNORMAL LOW (ref 39–117)
BUN: 27 mg/dL — ABNORMAL HIGH (ref 6–23)
CO2: 26 meq/L (ref 19–32)
Calcium: 9.8 mg/dL (ref 8.4–10.5)
Chloride: 102 meq/L (ref 96–112)
Creatinine, Ser: 1.26 mg/dL — ABNORMAL HIGH (ref 0.40–1.20)
GFR: 42.12 mL/min — ABNORMAL LOW (ref >60.00)
Glucose, Bld: 98 mg/dL (ref 70–99)
Potassium: 3.8 meq/L (ref 3.5–5.1)
Sodium: 141 meq/L (ref 135–145)
Total Bilirubin: 0.4 mg/dL (ref 0.2–1.2)
Total Protein: 7.6 g/dL (ref 6.0–8.3)

## 2024-05-12 LAB — TSH: TSH: 1.82 u[IU]/mL (ref 0.35–5.50)

## 2024-05-12 LAB — VITAMIN D 25 HYDROXY (VIT D DEFICIENCY, FRACTURES): VITD: 73.18 ng/mL (ref 30.00–100.00)

## 2024-05-16 ENCOUNTER — Ambulatory Visit: Payer: PPO | Admitting: Internal Medicine

## 2024-05-16 ENCOUNTER — Ambulatory Visit: Payer: Self-pay | Admitting: Family Medicine

## 2024-05-16 NOTE — Progress Notes (Signed)
 Her kidney function is decreased. This has been an issue in the past as well. Her labs are stable otherwise. Please ask her to follow up with me in 3 months and we will recheck her kidney function at that time also. For now, please stay hydrated and limit over the counter pain medications such as ibuprofen, Aleve, Advil, etc. Ok to take Tylenol  if needed for pain.

## 2024-06-26 ENCOUNTER — Other Ambulatory Visit: Payer: Self-pay | Admitting: Family Medicine

## 2024-06-26 DIAGNOSIS — E039 Hypothyroidism, unspecified: Secondary | ICD-10-CM

## 2024-07-05 ENCOUNTER — Encounter: Payer: Self-pay | Admitting: Pharmacist

## 2024-07-05 NOTE — Progress Notes (Signed)
 Pharmacy Quality Measure Review  This patient is appearing on a report for being at risk of failing the adherence measure for cholesterol (statin) medications this calendar year.   Medication: Rosuvastatin  20 mg Last fill date: 05/11/24 for 90 day supply  Pt is not due for refill until October, however will fail adherence measure 07/14/24 due to previously overdue refill. Have set a reminder to f/u adherence for future fills on 10/16 when pt comes for OV.   Darrelyn Drum, PharmD, BCPS, CPP Clinical Pharmacist Practitioner [facility] [phone]

## 2024-07-17 ENCOUNTER — Ambulatory Visit (INDEPENDENT_AMBULATORY_CARE_PROVIDER_SITE_OTHER)

## 2024-07-17 VITALS — Ht 63.0 in | Wt 151.0 lb

## 2024-07-17 DIAGNOSIS — Z0000 Encounter for general adult medical examination without abnormal findings: Secondary | ICD-10-CM

## 2024-07-17 NOTE — Patient Instructions (Addendum)
 Ms. Sarah Hartman,  Thank you for taking the time for your Medicare Wellness Visit. I appreciate your continued commitment to your health goals. Please review the care plan we discussed, and feel free to reach out if I can assist you further.  Medicare recommends these wellness visits once per year to help you and your care team stay ahead of potential health issues. These visits are designed to focus on prevention, allowing your provider to concentrate on managing your acute and chronic conditions during your regular appointments.  Please note that Annual Wellness Visits do not include a physical exam. Some assessments may be limited, especially if the visit was conducted virtually. If needed, we may recommend a separate in-person follow-up with your provider.  Ongoing Care Seeing your primary care provider every 3 to 6 months helps us  monitor your health and provide consistent, personalized care.   Referrals If a referral was made during today's visit and you haven't received any updates within two weeks, please contact the referred provider directly to check on the status.  Recommended Screenings:  Health Maintenance  Topic Date Due   Flu Shot  06/02/2024   COVID-19 Vaccine (5 - Moderna risk 2024-25 season) 07/03/2024   Breast Cancer Screening  04/19/2025   Medicare Annual Wellness Visit  07/17/2025   DEXA scan (bone density measurement)  05/22/2026   Colon Cancer Screening  09/29/2027   DTaP/Tdap/Td vaccine (3 - Td or Tdap) 12/27/2028   Pneumococcal Vaccine for age over 29  Completed   Hepatitis C Screening  Completed   HPV Vaccine  Aged Out   Meningitis B Vaccine  Aged Out   Zoster (Shingles) Vaccine  Discontinued       01/08/2022    9:49 AM  Advanced Directives  Does Patient Have a Medical Advance Directive? Yes  Type of Estate agent of [place];Living will  Does patient want to make changes to medical advance directive? No - Patient declined  Copy of  Healthcare Power of Attorney in Chart? No - copy requested   Advance Care Planning is important because it: Ensures you receive medical care that aligns with your values, goals, and preferences. Provides guidance to your family and loved ones, reducing the emotional burden of decision-making during critical moments.  Vision: Annual vision screenings are recommended for early detection of glaucoma, cataracts, and diabetic retinopathy. These exams can also reveal signs of chronic conditions such as diabetes and high blood pressure.  Dental: Annual dental screenings help detect early signs of oral cancer, gum disease, and other conditions linked to overall health, including heart disease and diabetes.

## 2024-07-17 NOTE — Progress Notes (Addendum)
 Subjective:  Please attest and cosign this visit due to patients primary care provider not being in the office at the time the visit was completed.  (Pt of Sarah Mackintosh, NP)   Sarah Hartman is a 73 y.o. who presents for a Medicare Wellness preventive visit.  As a reminder, Annual Wellness Visits don't include a physical exam, and some assessments may be limited, especially if this visit is performed virtually. We may recommend an in-person follow-up visit with your provider if needed.  Visit Complete: Virtual I connected with  Lucienne GORMAN Lesches on 07/17/24 by a audio enabled telemedicine application and verified that I am speaking with the correct person using two identifiers.  Patient Location: Home  Provider Location: Office/Clinic  I discussed the limitations of evaluation and management by telemedicine. The patient expressed understanding and agreed to proceed.  Vital Signs: Because this visit was a virtual/telehealth visit, some criteria may be missing or patient reported. Any vitals not documented were not able to be obtained and vitals that have been documented are patient reported.  VideoDeclined- This patient declined Librarian, academic. Therefore the visit was completed with audio only.  Persons Participating in Visit: Patient.  AWV Questionnaire: No: Patient Medicare AWV questionnaire was not completed prior to this visit.  Cardiac Risk Factors include: advanced age (>68men, >52 women);dyslipidemia;hypertension     Objective:    Today's Vitals   07/17/24 1536  Weight: 151 lb (68.5 kg)  Height: 5' 3 (1.6 m)   Body mass index is 26.75 kg/m.     07/17/2024    3:47 PM 01/08/2022    9:49 AM 12/25/2020   11:29 AM 03/19/2020   10:22 AM 12/27/2018    2:05 PM 12/20/2017    9:03 AM 01/25/2017    9:48 AM  Advanced Directives  Does Patient Have a Medical Advance Directive? No Yes No No No  No  No   Type of Press photographer;Living will       Does patient want to make changes to medical advance directive?  No - Patient declined       Copy of Healthcare Power of Attorney in Chart?  No - copy requested       Would patient like information on creating a medical advance directive? Yes (MAU/Ambulatory/Procedural Areas - Information given)  No - Patient declined No - Patient declined No - Patient declined  Yes (MAU/Ambulatory/Procedural Areas - Information given)  Yes (MAU/Ambulatory/Procedural Areas - Information given)      Data saved with a previous flowsheet row definition    Current Medications (verified) Outpatient Encounter Medications as of 07/17/2024  Medication Sig   acyclovir  (ZOVIRAX ) 400 MG tablet Take  1 capsule  3 x /day  with Meals  &  2 capsules  at Bedtime for Mouth Ulcers   bisoprolol  (ZEBETA ) 10 MG tablet TAKE 1/2 TO 1 (ONE-HALF TO ONE) TABLET BY MOUTH IN THE MORNING FOR BLOOD PRESSURE   Cholecalciferol (VITAMIN D3) 5000 units TABS Take 5,000 Units by mouth daily.   hydrochlorothiazide  (HYDRODIURIL ) 25 MG tablet TAKE 1 TABLET BY MOUTH ONCE DAILY FOR BLOOD PRESSURE AND FOR FLUID   levothyroxine  (SYNTHROID ) 50 MCG tablet TAKE 1 TABLET BY MOUTH ONCE DAILY ON AN EMPTY STOMACH. NO ANTACID MEDS, CALCIUM , OR MAGNESIUM FOR 30 MINUTES. AVOID BIOTIN.   Magnesium 500 MG TABS Take by mouth 2 (two) times daily.   mometasone (ELOCON) 0.1 % cream Apply 1 Application topically as  needed.   Multiple Vitamins-Minerals (PRESERVISION AREDS 2 PO) daily.   olmesartan  (BENICAR ) 20 MG tablet Take 1 tablet (20 mg total) by mouth daily.   rosuvastatin  (CRESTOR ) 20 MG tablet Take one tablet every other day.   UNABLE TO FIND Med Name: Magnalife cream   No facility-administered encounter medications on file as of 07/17/2024.    Allergies (verified) Codeine   History: Past Medical History:  Diagnosis Date   Adenomatous colon polyp    Allergy    GERD (gastroesophageal reflux disease)    Heart murmur     history of as teenager    Herpes labialis 05/11/2024   Hyperlipidemia    IBS (irritable bowel syndrome)    Labile hypertension    white coat per pt   Macular degeneration of left eye    RLS (restless legs syndrome)    Thyroid  disease    hypothyroid   Vitamin D  deficiency    Past Surgical History:  Procedure Laterality Date   COLONOSCOPY     POLYPECTOMY     TENDON RECONSTRUCTION Right 04/27/2013   Procedure: RIGHT ELBOW DEBRIDE/REPAIR/EXPLORE - LATERAL HUMERAL EPICONDYLE;  Surgeon: Toribio JULIANNA Chancy, MD;  Location: [facility];  Service: Orthopedics;  Laterality: Right;   TUBAL LIGATION     Family History  Problem Relation Age of Onset   Hypertension Father    Cerebral aneurysm Father    Alzheimer's disease Mother    Stomach cancer Paternal Grandmother 45   Colon cancer Neg Hx    Esophageal cancer Neg Hx    Rectal cancer Neg Hx    Social History   Socioeconomic History   Marital status: Single    Spouse name: Not on file   Number of children: Not on file   Years of education: Not on file   Highest education level: GED or equivalent  Occupational History   Not on file  Tobacco Use   Smoking status: Former    Current packs/day: 0.00    Average packs/day: 2.0 packs/day for 14.5 years (28.9 ttl pk-yrs)    Types: Cigarettes    Start date: 11/02/1966    Quit date: 04/24/1981    Years since quitting: 43.2   Smokeless tobacco: Never  Vaping Use   Vaping status: Never Used  Substance and Sexual Activity   Alcohol use: Yes    Alcohol/week: 3.0 standard drinks of alcohol    Types: 3 Glasses of wine per week    Comment: occas   Drug use: No   Sexual activity: Not Currently    Birth control/protection: Post-menopausal, Surgical  Other Topics Concern   Not on file  Social History Narrative   Not on file   Social Drivers of Health   Financial Resource Strain: Low Risk  (07/17/2024)   Overall Financial Resource Strain (CARDIA)    Difficulty of Paying Living  Expenses: Not hard at all  Food Insecurity: No Food Insecurity (07/17/2024)   Hunger Vital Sign    Worried About Running Out of Food in the Last Year: Never true    Ran Out of Food in the Last Year: Never true  Transportation Needs: No Transportation Needs (07/17/2024)   PRAPARE - Administrator, Civil Service (Medical): No    Lack of Transportation (Non-Medical): No  Physical Activity: Sufficiently Active (07/17/2024)   Exercise Vital Sign    Days of Exercise per Week: 6 days    Minutes of Exercise per Session: 50 min  Stress: No Stress  Concern Present (07/17/2024)   [employer] of Occupational Health - Occupational Stress Questionnaire    Feeling of Stress: Not at all  Social Connections: Moderately Isolated (07/17/2024)   Social Connection and Isolation Panel    Frequency of Communication with Friends and Family: More than three times a week    Frequency of Social Gatherings with Friends and Family: Once a week    Attends Religious Services: Never    Database administrator or Organizations: Yes    Attends Engineer, structural: 1 to 4 times per year    Marital Status: Divorced    Tobacco Counseling Counseling given: No    Clinical Intake:  Pre-visit preparation completed: Yes  Pain : No/denies pain     BMI - recorded: 26.75 Nutritional Status: BMI 25 -29 Overweight Nutritional Risks: None Diabetes: No  Lab Results  Component Value Date   HGBA1C 5.8 05/11/2024   HGBA1C 5.7 (H) 11/05/2023   HGBA1C 5.7 (H) 04/28/2023     How often do you need to have someone help you when you read instructions, pamphlets, or other written materials from your doctor or pharmacy?: 1 - Never  Interpreter Needed?: No  Information entered by :: Verdie Saba, CMA   Activities of Daily Living     07/17/2024    3:39 PM  In your present state of health, do you have any difficulty performing the following activities:  Hearing? 0  Vision? 0  Difficulty  concentrating or making decisions? 0  Walking or climbing stairs? 0  Dressing or bathing? 0  Doing errands, shopping? 0  Preparing Food and eating ? N  Using the Toilet? N  In the past six months, have you accidently leaked urine? N  Do you have problems with loss of bowel control? N  Managing your Medications? N  Managing your Finances? N  Housekeeping or managing your Housekeeping? N    Patient Care Team: Lendia Sarah CROME, NP-C as PCP - General (Family Medicine) Beverley Toribio FALCON, MD (Inactive) as Consulting Physician (Orthopedic Surgery) Abran Norleen SAILOR, MD as Consulting Physician (Gastroenterology) Nieves Cough, MD as Consulting Physician (Urology) Rosalynn LELON Ingle, MD (Inactive) as Consulting Physician (Obstetrics and Gynecology) Nancyann DOROTHA Ebbing, M.D., PA (Optometry)  I have updated your Care Teams any recent Medical Services you may have received from other providers in the past year.     Assessment:   This is a routine wellness examination for [place].  Hearing/Vision screen Hearing Screening - Comments:: Denies hearing difficulties   Vision Screening - Comments:: Wears eyeglasses for reading  - up to date with routine eye exams with [facility]   Goals Addressed               This Visit's Progress     Patient Stated (pt-stated)        Patient stated she plans to continue doing well - staying active       Depression Screen     07/17/2024    3:41 PM 01/26/2023   11:19 AM 10/26/2022   11:23 PM 01/08/2022    9:52 AM 09/29/2021   11:48 PM 12/25/2020   11:37 AM 09/15/2020    7:52 PM  PHQ 2/9 Scores  PHQ - 2 Score 0 0 0 0 0 0 0  PHQ- 9 Score 0          Fall Risk     07/17/2024    3:40 PM 02/02/2024   10:35 AM 01/26/2023  11:19 AM 10/26/2022   11:23 PM 01/08/2022    9:51 AM  Fall Risk   Falls in the past year? 0 0 0 0 0  Number falls in past yr: 0 0   0  Injury with Fall? 0 0   0  Risk for fall due to : No Fall Risks No Fall Risks  No Fall Risks No  Fall Risks  Follow up Falls evaluation completed;Falls prevention discussed Falls evaluation completed  Falls prevention discussed;Education provided;Falls evaluation completed  Falls prevention discussed;Falls evaluation completed      Data saved with a previous flowsheet row definition    MEDICARE RISK AT HOME:  Medicare Risk at Home Any stairs in or around the home?: Yes If so, are there any without handrails?: No Home free of loose throw rugs in walkways, pet beds, electrical cords, etc?: Yes Adequate lighting in your home to reduce risk of falls?: Yes Life alert?: No Use of a cane, walker or w/c?: No Grab bars in the bathroom?: Yes Shower chair or bench in shower?: No Elevated toilet seat or a handicapped toilet?: No  TIMED UP AND GO:  Was the test performed?  No  Cognitive Function: 6CIT completed        07/17/2024    3:43 PM  6CIT Screen  What Year? 0 points  What month? 0 points  What time? 0 points  Count back from 20 0 points  Months in reverse 0 points  Repeat phrase 0 points  Total Score 0 points    Immunizations Immunization History  Administered Date(s) Administered   Fluad Quad(high Dose 65+) 11/06/2021, 08/03/2023   INFLUENZA, HIGH DOSE SEASONAL PF 08/01/2018, 07/07/2019, 09/30/2022   Influenza-Unspecified 07/29/2016, 09/01/2020, 07/04/2023   Moderna SARS-COV2 Booster Vaccination 09/10/2021   Moderna Sars-Covid-2 Vaccination 11/29/2019, 12/27/2019, 09/01/2020   Pneumococcal Conjugate-13 08/19/2015   Pneumococcal Polysaccharide-23 11/09/2012, 08/01/2018   Respiratory Syncytial Virus Vaccine,Recomb Aduvanted(Arexvy) 07/30/2022   Td 12/27/2018   Tdap 10/18/2008   Unspecified SARS-COV-2 Vaccination 07/04/2023   Zoster, Live 07/03/2014    Screening Tests Health Maintenance  Topic Date Due   Influenza Vaccine  06/02/2024   COVID-19 Vaccine (5 - Moderna risk 2024-25 season) 07/03/2024   Mammogram  04/19/2025   Medicare Annual Wellness (AWV)   07/17/2025   DEXA SCAN  05/22/2026   Colonoscopy  09/29/2027   DTaP/Tdap/Td (3 - Td or Tdap) 12/27/2028   Pneumococcal Vaccine: 50+ Years  Completed   Hepatitis C Screening  Completed   HPV VACCINES  Aged Out   Meningococcal B Vaccine  Aged Out   Zoster Vaccines- Shingrix  Discontinued    Health Maintenance Items Addressed: 07/17/2024  Additional Screening:  Vision Screening: Recommended annual ophthalmology exams for early detection of glaucoma and other disorders of the eye. Is the patient up to date with their annual eye exam?  Yes  Who is the provider or what is the name of the office in which the patient attends annual eye exams? [facility]  Dental Screening: Recommended annual dental exams for proper oral hygiene  Community Resource Referral / Chronic Care Management: CRR required this visit?  No   CCM required this visit?  No   Plan:    I have personally reviewed and noted the following in the patient's chart:   Medical and social history Use of alcohol, tobacco or illicit drugs  Current medications and supplements including opioid prescriptions. Patient is not currently taking opioid prescriptions. Functional ability and status Nutritional status Physical  activity Advanced directives List of other physicians Hospitalizations, surgeries, and ER visits in previous 12 months Vitals Screenings to include cognitive, depression, and falls Referrals and appointments  In addition, I have reviewed and discussed with patient certain preventive protocols, quality metrics, and best practice recommendations. A written personalized care plan for preventive services as well as general preventive health recommendations were provided to patient.   Verdie CHRISTELLA Saba, CMA   07/17/2024   After Visit Summary: (Declined) Due to this being a telephonic visit, with patients personalized plan was offered to patient but patient Declined AVS at this time   Notes: Nothing significant  to report at this time.

## 2024-07-26 DIAGNOSIS — H04123 Dry eye syndrome of bilateral lacrimal glands: Secondary | ICD-10-CM | POA: Diagnosis not present

## 2024-07-26 DIAGNOSIS — G43809 Other migraine, not intractable, without status migrainosus: Secondary | ICD-10-CM | POA: Diagnosis not present

## 2024-07-26 DIAGNOSIS — H353131 Nonexudative age-related macular degeneration, bilateral, early dry stage: Secondary | ICD-10-CM | POA: Diagnosis not present

## 2024-08-16 ENCOUNTER — Ambulatory Visit: Payer: PPO | Admitting: Nurse Practitioner

## 2024-08-17 ENCOUNTER — Encounter: Payer: Self-pay | Admitting: Family Medicine

## 2024-08-17 ENCOUNTER — Ambulatory Visit: Admitting: Family Medicine

## 2024-08-17 ENCOUNTER — Ambulatory Visit: Payer: Self-pay | Admitting: Family Medicine

## 2024-08-17 VITALS — BP 108/74 | HR 53 | Temp 97.6°F | Ht 63.0 in | Wt 149.0 lb

## 2024-08-17 DIAGNOSIS — E782 Mixed hyperlipidemia: Secondary | ICD-10-CM

## 2024-08-17 DIAGNOSIS — I10 Essential (primary) hypertension: Secondary | ICD-10-CM | POA: Diagnosis not present

## 2024-08-17 DIAGNOSIS — R7303 Prediabetes: Secondary | ICD-10-CM

## 2024-08-17 DIAGNOSIS — E039 Hypothyroidism, unspecified: Secondary | ICD-10-CM

## 2024-08-17 LAB — CBC WITH DIFFERENTIAL/PLATELET
Basophils Absolute: 0 K/uL (ref 0.0–0.1)
Basophils Relative: 0.6 % (ref 0.0–3.0)
Eosinophils Absolute: 0 K/uL (ref 0.0–0.7)
Eosinophils Relative: 0.5 % (ref 0.0–5.0)
HCT: 36.1 % (ref 36.0–46.0)
Hemoglobin: 12.5 g/dL (ref 12.0–15.0)
Lymphocytes Relative: 34.8 % (ref 12.0–46.0)
Lymphs Abs: 1.9 K/uL (ref 0.7–4.0)
MCHC: 34.6 g/dL (ref 30.0–36.0)
MCV: 89.5 fl (ref 78.0–100.0)
Monocytes Absolute: 0.3 K/uL (ref 0.1–1.0)
Monocytes Relative: 4.5 % (ref 3.0–12.0)
Neutro Abs: 3.3 K/uL (ref 1.4–7.7)
Neutrophils Relative %: 59.6 % (ref 43.0–77.0)
Platelets: 313 K/uL (ref 150.0–400.0)
RBC: 4.03 Mil/uL (ref 3.87–5.11)
RDW: 13.1 % (ref 11.5–15.5)
WBC: 5.5 K/uL (ref 4.0–10.5)

## 2024-08-17 LAB — BASIC METABOLIC PANEL WITH GFR
BUN: 21 mg/dL (ref 6–23)
CO2: 28 meq/L (ref 19–32)
Calcium: 9.9 mg/dL (ref 8.4–10.5)
Chloride: 102 meq/L (ref 96–112)
Creatinine, Ser: 1.07 mg/dL (ref 0.40–1.20)
GFR: 51.16 mL/min — ABNORMAL LOW (ref >60.00)
Glucose, Bld: 93 mg/dL (ref 70–99)
Potassium: 4 meq/L (ref 3.5–5.1)
Sodium: 141 meq/L (ref 135–145)

## 2024-08-17 LAB — TSH: TSH: 2.04 u[IU]/mL (ref 0.35–5.50)

## 2024-08-17 LAB — HEMOGLOBIN A1C: Hgb A1c MFr Bld: 5.9 % (ref 4.6–6.5)

## 2024-08-17 NOTE — Progress Notes (Signed)
 Her kidney function has improved. Her A1c is stable at 5.9% and thyroid  function is also normal. No new concerns.

## 2024-08-17 NOTE — Progress Notes (Signed)
 Subjective:     Patient ID: Sarah Hartman, female    DOB: Sep 23, 1951, 73 y.o.   MRN: [id number]  Chief Complaint  Patient presents with   Medical Management of Chronic Issues    3 month f/u    HPI  Discussed the use of AI scribe software for clinical note transcription with the patient, who gave verbal consent to proceed.  History of Present Illness Sarah Hartman is a 73 year old female who presents for follow-up on chronic health conditions.  Ocular migraines and visual disturbances - Ocular migraines with visual disturbances have occurred twice since June - recent work up with ophthalmologist  - No associated headaches following visual disturbances - Warm cloth provides symptomatic relief - Uses Refresh eye drops for dry eyes  Weight gain and dietary habits - Weight increased to nearly 150 pounds - Weight gain attributed to snacking out of boredom - Maintains a diet and exercise routine  Hydration and renal monitoring - Stays well hydrated due to medication regimen - Monitors urine color to assess kidney function  Palpitations and cardiac monitoring - History of palpitations with only one episode since starting annual EKGs at age 56  Dizziness and syncope - Half tablet of bisoprolol  daily has reduced dizziness and fainting spells     There are no preventive care reminders to display for this patient.  Past Medical History:  Diagnosis Date   Adenomatous colon polyp    Allergy    GERD (gastroesophageal reflux disease)    Heart murmur    history of as teenager    Herpes labialis 05/11/2024   Hyperlipidemia    IBS (irritable bowel syndrome)    Labile hypertension    white coat per pt   Macular degeneration of left eye    RLS (restless legs syndrome)    Thyroid  disease    hypothyroid   Vitamin D  deficiency     Past Surgical History:  Procedure Laterality Date   COLONOSCOPY     POLYPECTOMY     TENDON RECONSTRUCTION Right 04/27/2013    Procedure: RIGHT ELBOW DEBRIDE/REPAIR/EXPLORE - LATERAL HUMERAL EPICONDYLE;  Surgeon: Toribio JULIANNA Chancy, MD;  Location: [facility];  Service: Orthopedics;  Laterality: Right;   TUBAL LIGATION      Family History  Problem Relation Age of Onset   Hypertension Father    Cerebral aneurysm Father    Alzheimer's disease Mother    Stomach cancer Paternal Grandmother 37   Colon cancer Neg Hx    Esophageal cancer Neg Hx    Rectal cancer Neg Hx     Social History   Socioeconomic History   Marital status: Single    Spouse name: Not on file   Number of children: Not on file   Years of education: Not on file   Highest education level: GED or equivalent  Occupational History   Not on file  Tobacco Use   Smoking status: Former    Current packs/day: 0.00    Average packs/day: 2.0 packs/day for 14.5 years (28.9 ttl pk-yrs)    Types: Cigarettes    Start date: 11/02/1966    Quit date: 04/24/1981    Years since quitting: 43.3   Smokeless tobacco: Never  Vaping Use   Vaping status: Never Used  Substance and Sexual Activity   Alcohol use: Yes    Alcohol/week: 3.0 standard drinks of alcohol    Types: 3 Glasses of wine per week    Comment: occas  Drug use: No   Sexual activity: Not Currently    Birth control/protection: Post-menopausal, Surgical  Other Topics Concern   Not on file  Social History Narrative   Not on file   Social Drivers of Health   Financial Resource Strain: Low Risk  (07/17/2024)   Overall Financial Resource Strain (CARDIA)    Difficulty of Paying Living Expenses: Not hard at all  Food Insecurity: No Food Insecurity (07/17/2024)   Hunger Vital Sign    Worried About Running Out of Food in the Last Year: Never true    Ran Out of Food in the Last Year: Never true  Transportation Needs: No Transportation Needs (07/17/2024)   PRAPARE - Administrator, Civil Service (Medical): No    Lack of Transportation (Non-Medical): No  Physical Activity:  Sufficiently Active (07/17/2024)   Exercise Vital Sign    Days of Exercise per Week: 6 days    Minutes of Exercise per Session: 50 min  Stress: No Stress Concern Present (07/17/2024)   [employer] of Occupational Health - Occupational Stress Questionnaire    Feeling of Stress: Not at all  Social Connections: Moderately Isolated (07/17/2024)   Social Connection and Isolation Panel    Frequency of Communication with Friends and Family: More than three times a week    Frequency of Social Gatherings with Friends and Family: Once a week    Attends Religious Services: Never    Database administrator or Organizations: Yes    Attends Banker Meetings: 1 to 4 times per year    Marital Status: Divorced  Intimate Partner Violence: Not At Risk (07/17/2024)   Humiliation, Afraid, Rape, and Kick questionnaire    Fear of Current or Ex-Partner: No    Emotionally Abused: No    Physically Abused: No    Sexually Abused: No    Outpatient Medications Prior to Visit  Medication Sig Dispense Refill   acyclovir  (ZOVIRAX ) 400 MG tablet Take  1 capsule  3 x /day  with Meals  &  2 capsules  at Bedtime for Mouth Ulcers 50 tablet 3   bisoprolol  (ZEBETA ) 10 MG tablet TAKE 1/2 TO 1 (ONE-HALF TO ONE) TABLET BY MOUTH IN THE MORNING FOR BLOOD PRESSURE 90 tablet 0   Cholecalciferol (VITAMIN D3) 5000 units TABS Take 5,000 Units by mouth daily.     hydrochlorothiazide  (HYDRODIURIL ) 25 MG tablet TAKE 1 TABLET BY MOUTH ONCE DAILY FOR BLOOD PRESSURE AND FOR FLUID 90 tablet 0   levothyroxine  (SYNTHROID ) 50 MCG tablet TAKE 1 TABLET BY MOUTH ONCE DAILY ON AN EMPTY STOMACH. NO ANTACID MEDS, CALCIUM , OR MAGNESIUM FOR 30 MINUTES. AVOID BIOTIN. 90 tablet 0   Magnesium 500 MG TABS Take by mouth 2 (two) times daily.     mometasone (ELOCON) 0.1 % cream Apply 1 Application topically as needed.     Multiple Vitamins-Minerals (PRESERVISION AREDS 2 PO) daily.     olmesartan  (BENICAR ) 20 MG tablet Take 1 tablet (20 mg  total) by mouth daily. 90 tablet 0   rosuvastatin  (CRESTOR ) 20 MG tablet Take one tablet every other day. 45 tablet 3   UNABLE TO FIND Med Name: Magnalife cream     No facility-administered medications prior to visit.    Allergies  Allergen Reactions   Codeine Nausea And Vomiting and Other (See Comments)    Migraine    Review of Systems  Constitutional:  Negative for chills, fever, malaise/fatigue and weight loss.  Respiratory:  Negative for shortness  of breath.   Cardiovascular:  Negative for chest pain, palpitations and leg swelling.  Gastrointestinal:  Negative for abdominal pain, constipation, diarrhea, nausea and vomiting.  Genitourinary:  Negative for dysuria, frequency and urgency.  Neurological:  Negative for dizziness, focal weakness and headaches.  Psychiatric/Behavioral:  Negative for depression. The patient is not nervous/anxious.        Objective:    Physical Exam Constitutional:      General: She is not in acute distress.    Appearance: She is not ill-appearing.  Eyes:     Extraocular Movements: Extraocular movements intact.     Conjunctiva/sclera: Conjunctivae normal.  Cardiovascular:     Rate and Rhythm: Bradycardia present.  Pulmonary:     Effort: Pulmonary effort is normal.  Musculoskeletal:     Cervical back: Normal range of motion and neck supple.     Right lower leg: No edema.     Left lower leg: No edema.  Skin:    General: Skin is warm and dry.  Neurological:     General: No focal deficit present.     Mental Status: She is alert and oriented to person, place, and time.     Motor: No weakness.     Coordination: Coordination normal.     Gait: Gait normal.  Psychiatric:        Mood and Affect: Mood normal.        Behavior: Behavior normal.        Thought Content: Thought content normal.      BP 108/74   Pulse (!) 53   Temp 97.6 F (36.4 C) (Temporal)   Ht 5' 3 (1.6 m)   Wt 149 lb (67.6 kg)   SpO2 98%   BMI 26.39 kg/m  Wt Readings  from Last 3 Encounters:  08/17/24 149 lb (67.6 kg)  07/17/24 151 lb (68.5 kg)  05/11/24 151 lb (68.5 kg)       Assessment & Plan:   Problem List Items Addressed This Visit     Hyperlipidemia, mixed   Hypothyroidism - Primary   Relevant Orders   TSH (Completed)   Other Visit Diagnoses       Essential hypertension       Relevant Orders   Basic metabolic panel with GFR (Completed)   CBC with Differential/Platelet (Completed)     Prediabetes       Relevant Orders   Hemoglobin A1c (Completed)   TSH (Completed)       Assessment and Plan Assessment & Plan Hypertension Blood pressure readings are within normal range. She is on a half dose of bisoprolol , which has alleviated previous symptoms of dizziness and faintness. - Continue olmesartan , hydrochlorothiazide , and bisoprolol  at current doses. - Consider reducing bisoprolol  dose if bradycardia symptoms worsen.  Chronic kidney disease Chronic kidney disease is being monitored. Recent urine microalbumin test was normal. She is advised to maintain hydration to support kidney function. - Encourage adequate hydration. - Order blood work to recheck kidney function.  Prediabetes Prediabetes is being monitored with an A1c of 5.8, indicating early prediabetes. Lifestyle modifications, including dietary changes, have been implemented. - Continue monitoring A1c levels. - Encourage dietary modifications to manage blood sugar levels.  Hyperlipidemia Hyperlipidemia is managed with rosuvastatin . Dietary changes, such as reducing sweet tea consumption, have been made to manage cholesterol levels. - Continue rosuvastatin . - Encourage dietary modifications to manage cholesterol levels.  Ocular migraine Ocular migraine episodes have occurred twice, resolving spontaneously within minutes without associated headaches. No specific  treatment is prescribed as symptoms are self-limiting. - Consider keeping a symptom diary to track  episodes.  Obesity Reports weight gain, currently at 150 pounds. Contributing factors include dietary habits and snacking due to boredom. She is aware of the need for lifestyle changes. - Encourage lifestyle modifications, including reducing snacking and increasing physical activity. - Consider portion control strategies for snacks.  Bradycardia Bradycardia with a pulse in the fifties is noted, not causing symptoms. She is on a half dose of bisoprolol , which has alleviated previous symptoms of dizziness. - Consider reducing bisoprolol  dose if heart rate decreases further.  General Health Maintenance Plans to schedule a flu shot closer to winter. - Schedule flu shot closer to winter.     I am having Sarah Hartman maintain her Vitamin D3, Multiple Vitamins-Minerals (PRESERVISION AREDS 2 PO), mometasone, Magnesium, acyclovir , bisoprolol , hydrochlorothiazide , olmesartan , rosuvastatin , UNABLE TO FIND, and levothyroxine .  No orders of the defined types were placed in this encounter.

## 2024-08-29 ENCOUNTER — Other Ambulatory Visit: Payer: Self-pay

## 2024-08-29 MED ORDER — OLMESARTAN MEDOXOMIL 20 MG PO TABS: 20.0000 mg | ORAL_TABLET | Freq: Every day | ORAL | 0 refills | Status: AC

## 2024-08-29 NOTE — Telephone Encounter (Signed)
 Copied from CRM [id number]. Topic: Clinical - Prescription Issue >> Aug 29, 2024  8:19 AM Ivette P wrote: Reason for CRM: pt is wanting to refill olmesartan  (BENICAR ) 20 MG tablet which used to be refilled before with old primary.    Pt wants to know if can be filled and would like a follow up

## 2024-08-30 ENCOUNTER — Other Ambulatory Visit: Payer: Self-pay | Admitting: Family Medicine

## 2024-08-30 ENCOUNTER — Other Ambulatory Visit: Payer: Self-pay | Admitting: Family

## 2024-08-30 DIAGNOSIS — I10 Essential (primary) hypertension: Secondary | ICD-10-CM

## 2024-08-30 DIAGNOSIS — E039 Hypothyroidism, unspecified: Secondary | ICD-10-CM

## 2024-09-20 ENCOUNTER — Other Ambulatory Visit: Payer: Self-pay | Admitting: Family Medicine

## 2024-09-20 DIAGNOSIS — I10 Essential (primary) hypertension: Secondary | ICD-10-CM

## 2024-09-20 NOTE — Telephone Encounter (Unsigned)
 Copied from CRM #[id number]. Topic: Clinical - Medication Refill >> Sep 20, 2024  3:20 PM Shanda MATSU wrote: Medication: hydrochlorothiazide  (HYDRODIURIL ) 25 MG tablet  Has the patient contacted their pharmacy? Yes, no answer (Agent: If no, request that the patient contact the pharmacy for the refill. If patient does not wish to contact the pharmacy document the reason why and proceed with request.) (Agent: If yes, when and what did the pharmacy advise?)  This is the patient's preferred pharmacy:  [facility] [address] [place] - [address] [place] [address] Phone: [phone] Fax: [phone]  Is this the correct pharmacy for this prescription? Yes If no, delete pharmacy and type the correct one.   Has the prescription been filled recently? No  Is the patient out of the medication? No  Has the patient been seen for an appointment in the last year OR does the patient have an upcoming appointment? Yes  Can we respond through MyChart? No  Agent: Please be advised that Rx refills may take up to 3 business days. We ask that you follow-up with your pharmacy.

## 2024-09-21 MED ORDER — HYDROCHLOROTHIAZIDE 25 MG PO TABS: ORAL_TABLET | ORAL | 0 refills | Status: AC

## 2024-11-06 ENCOUNTER — Ambulatory Visit

## 2024-11-08 ENCOUNTER — Encounter: Payer: Self-pay | Admitting: Family Medicine

## 2024-11-08 ENCOUNTER — Ambulatory Visit: Admitting: Family Medicine

## 2024-11-08 ENCOUNTER — Ambulatory Visit

## 2024-11-08 ENCOUNTER — Ambulatory Visit: Payer: Self-pay | Admitting: Family Medicine

## 2024-11-08 VITALS — BP 102/62 | HR 50 | Temp 98.1°F

## 2024-11-08 VITALS — BP 102/62 | HR 60 | Ht 63.0 in | Wt 151.8 lb

## 2024-11-08 DIAGNOSIS — E559 Vitamin D deficiency, unspecified: Secondary | ICD-10-CM

## 2024-11-08 DIAGNOSIS — H3530 Unspecified macular degeneration: Secondary | ICD-10-CM | POA: Diagnosis not present

## 2024-11-08 DIAGNOSIS — E782 Mixed hyperlipidemia: Secondary | ICD-10-CM

## 2024-11-08 DIAGNOSIS — I10 Essential (primary) hypertension: Secondary | ICD-10-CM | POA: Diagnosis not present

## 2024-11-08 DIAGNOSIS — Z0000 Encounter for general adult medical examination without abnormal findings: Secondary | ICD-10-CM | POA: Diagnosis not present

## 2024-11-08 DIAGNOSIS — M8589 Other specified disorders of bone density and structure, multiple sites: Secondary | ICD-10-CM | POA: Diagnosis not present

## 2024-11-08 DIAGNOSIS — Z0001 Encounter for general adult medical examination with abnormal findings: Secondary | ICD-10-CM

## 2024-11-08 DIAGNOSIS — N183 Chronic kidney disease, stage 3 unspecified: Secondary | ICD-10-CM | POA: Diagnosis not present

## 2024-11-08 DIAGNOSIS — R7303 Prediabetes: Secondary | ICD-10-CM | POA: Diagnosis not present

## 2024-11-08 DIAGNOSIS — E039 Hypothyroidism, unspecified: Secondary | ICD-10-CM | POA: Diagnosis not present

## 2024-11-08 LAB — CBC
HCT: 36.1 % (ref 36.0–46.0)
Hemoglobin: 12.4 g/dL (ref 12.0–15.0)
MCHC: 34.5 g/dL (ref 30.0–36.0)
MCV: 89.8 fl (ref 78.0–100.0)
Platelets: 321 K/uL (ref 150.0–400.0)
RBC: 4.02 Mil/uL (ref 3.87–5.11)
RDW: 13.2 % (ref 11.5–15.5)
WBC: 5.5 K/uL (ref 4.0–10.5)

## 2024-11-08 LAB — COMPREHENSIVE METABOLIC PANEL WITH GFR
ALT: 16 U/L (ref 3–35)
AST: 23 U/L (ref 5–37)
Albumin: 4.7 g/dL (ref 3.5–5.2)
Alkaline Phosphatase: 37 U/L — ABNORMAL LOW (ref 39–117)
BUN: 16 mg/dL (ref 6–23)
CO2: 31 meq/L (ref 19–32)
Calcium: 9.9 mg/dL (ref 8.4–10.5)
Chloride: 103 meq/L (ref 96–112)
Creatinine, Ser: 1 mg/dL (ref 0.40–1.20)
GFR: 55.4 mL/min — ABNORMAL LOW
Glucose, Bld: 97 mg/dL (ref 70–99)
Potassium: 4 meq/L (ref 3.5–5.1)
Sodium: 141 meq/L (ref 135–145)
Total Bilirubin: 0.4 mg/dL (ref 0.2–1.2)
Total Protein: 7.4 g/dL (ref 6.0–8.3)

## 2024-11-08 LAB — LIPID PANEL
Cholesterol: 143 mg/dL (ref 28–200)
HDL: 61.9 mg/dL
LDL Cholesterol: 51 mg/dL (ref 10–99)
NonHDL: 80.88
Total CHOL/HDL Ratio: 2
Triglycerides: 149 mg/dL (ref 10.0–149.0)
VLDL: 29.8 mg/dL (ref 0.0–40.0)

## 2024-11-08 MED ORDER — LEVOTHYROXINE SODIUM 50 MCG PO TABS: ORAL_TABLET | ORAL | 1 refills | Status: AC

## 2024-11-08 NOTE — Progress Notes (Signed)
 "  Complete physical exam  Patient: Sarah Hartman   DOB: 29-Dec-1950   74 y.o. Female  MRN: [id number]  Subjective:    Chief Complaint  Patient presents with   Annual Exam   She is here for a complete physical exam.    Discussed the use of AI scribe software for clinical note transcription with the patient, who gave verbal consent to proceed.  History of Present Illness Sarah Hartman is a 74 year old female who presents for her annual physical exam and follow-up on chronic health conditions.  Glycemic control - Last hemoglobin A1c in October was 5.9%. - Monitors sugar and carbohydrate intake. - No changes in glycemic control.  Renal function - Prior mild decline in kidney function with some improvement since July. - Increases fluid intake and maintains hydration.  Dyslipidemia - Fasting today for cholesterol check due to prior mild triglyceride elevation. - Reduced sweet tea and made other dietary changes, resulting in improved triglyceride levels.  Thyroid  function - Takes levothyroxine  50 mcg daily. - Recent thyroid  testing was normal.  Hypertension - Takes blood pressure medications as prescribed.  Macular degeneration and visual disturbances - History of macular degeneration. - Prior brief kaleidoscope-like visual disturbances that resolved quickly.  Ear eczema - Intermittent ear eczema managed with topical cream.  Gastrointestinal symptoms - Occasional constipation controlled with Senokot as needed. - Recent short episode of abdominal pain that resolved after walking, consistent with prior gas-related pain.  Cardiac symptoms - History of heart palpitations up to age 3; no episodes since.    Health Maintenance  Topic Date Due   COVID-19 Vaccine (5 - 2025-26 season) 07/03/2024   Flu Shot  01/30/2025*   Breast Cancer Screening  04/19/2025   Medicare Annual Wellness Visit  11/08/2025   Osteoporosis screening with Bone Density Scan  05/22/2026    Colon Cancer Screening  09/29/2027   DTaP/Tdap/Td vaccine (3 - Td or Tdap) 12/27/2028   Pneumococcal Vaccine for age over 35  Completed   Hepatitis C Screening  Completed   Meningitis B Vaccine  Aged Out   Zoster (Shingles) Vaccine  Discontinued  *Topic was postponed. The date shown is not the original due date.     Depression screening:    11/08/2024   10:01 AM 07/17/2024    3:41 PM 01/26/2023   11:19 AM  Depression screen PHQ 2/9  Decreased Interest 0 0 0  Down, Depressed, Hopeless 0 0 0  PHQ - 2 Score 0 0 0  Altered sleeping 1 0   Tired, decreased energy 1 0   Change in appetite 0 0   Feeling bad or failure about yourself  0 0   Trouble concentrating 0 0   Moving slowly or fidgety/restless 0 0   Suicidal thoughts 0 0   PHQ-9 Score 2 0    Difficult doing work/chores Not difficult at all Not difficult at all      Data saved with a previous flowsheet row definition   Anxiety Screening:     No data to display           Patient Care Team: Lendia Boby CROME, NP-C as PCP - General (Family Medicine) Beverley Toribio FALCON, MD (Inactive) as Consulting Physician (Orthopedic Surgery) Abran Norleen SAILOR, MD as Consulting Physician (Gastroenterology) Nieves Cough, MD as Consulting Physician (Urology) Rosalynn LELON Ingle, MD (Inactive) as Consulting Physician (Obstetrics and Gynecology) Nancyann DOROTHA Ebbing, M.D., PA (Optometry)   Show/hide medication list[1]  Review of Systems  Constitutional:  Negative for chills, fever, malaise/fatigue and weight loss.  HENT:  Negative for congestion, ear pain, sinus pain and sore throat.   Eyes:  Negative for blurred vision, double vision and pain.  Respiratory:  Negative for cough, shortness of breath and wheezing.   Cardiovascular:  Negative for chest pain, palpitations and leg swelling.  Gastrointestinal:  Negative for abdominal pain, constipation, diarrhea, nausea and vomiting.  Genitourinary:  Negative for dysuria, frequency and urgency.   Musculoskeletal:  Negative for back pain, joint pain and myalgias.  Skin:  Negative for rash.  Neurological:  Negative for dizziness, tingling, focal weakness and headaches.  Psychiatric/Behavioral:  Negative for depression and memory loss. The patient is not nervous/anxious.        Objective:    BP 102/62 (BP Location: Right Arm, Patient Position: Sitting, Cuff Size: Normal)   Pulse (!) 50   Temp 98.1 F (36.7 C)   SpO2 97%  BP Readings from Last 3 Encounters:  11/08/24 102/62  11/08/24 102/62  08/17/24 108/74   Wt Readings from Last 3 Encounters:  11/08/24 151 lb 12.8 oz (68.9 kg)  08/17/24 149 lb (67.6 kg)  07/17/24 151 lb (68.5 kg)    Physical Exam Constitutional:      General: She is not in acute distress.    Appearance: She is not ill-appearing.  HENT:     Right Ear: Tympanic membrane, ear canal and external ear normal.     Left Ear: Tympanic membrane, ear canal and external ear normal.     Nose: Nose normal.     Mouth/Throat:     Mouth: Mucous membranes are moist.     Pharynx: Oropharynx is clear.  Eyes:     Extraocular Movements: Extraocular movements intact.     Conjunctiva/sclera: Conjunctivae normal.     Pupils: Pupils are equal, round, and reactive to light.  Neck:     Thyroid : No thyroid  mass, thyromegaly or thyroid  tenderness.  Cardiovascular:     Rate and Rhythm: Normal rate and regular rhythm.     Pulses: Normal pulses.     Heart sounds: Normal heart sounds.  Pulmonary:     Effort: Pulmonary effort is normal.     Breath sounds: Normal breath sounds.  Abdominal:     General: Bowel sounds are normal.     Palpations: Abdomen is soft.     Tenderness: There is no abdominal tenderness. There is no right CVA tenderness, left CVA tenderness, guarding or rebound.  Musculoskeletal:        General: Normal range of motion.     Cervical back: Normal range of motion and neck supple. No tenderness.     Right lower leg: No edema.     Left lower leg: No  edema.  Lymphadenopathy:     Cervical: No cervical adenopathy.  Skin:    General: Skin is warm and dry.     Findings: No lesion or rash.  Neurological:     General: No focal deficit present.     Mental Status: She is alert and oriented to person, place, and time.     Cranial Nerves: No cranial nerve deficit.     Sensory: No sensory deficit.     Motor: No weakness.     Gait: Gait normal.  Psychiatric:        Mood and Affect: Mood normal.        Behavior: Behavior normal.        Thought Content: Thought content normal.  No results found for any visits on 11/08/24.    Assessment & Plan:    Routine Health Maintenance and Physical Exam Problem List Items Addressed This Visit     Hyperlipidemia, mixed   Relevant Orders   Lipid panel   CT CARDIAC SCORING (SELF PAY ONLY)   Hypothyroidism   Relevant Medications   levothyroxine  (SYNTHROID ) 50 MCG tablet   Macular degeneration of left eye   Osteopenia   Vitamin D  deficiency   Other Visit Diagnoses       Encounter for general adult medical examination with abnormal findings    -  Primary     Prediabetes       Relevant Orders   Comprehensive metabolic panel with GFR   CBC     Essential hypertension       Relevant Orders   Comprehensive metabolic panel with GFR   CBC     Stage 3 chronic kidney disease, unspecified whether stage 3a or 3b CKD (HCC)       Relevant Orders   Comprehensive metabolic panel with GFR       Assessment and Plan Assessment & Plan Prediabetes A1c remains at 5.9, indicating prediabetes.  - Advised on dietary modifications to reduce sugar and carbohydrate intake  Chronic kidney disease Slightly declined kidney function, previously improved. Importance of hydration and avoiding nephrotoxic medications discussed. - Continue to monitor kidney function every 6 months - Advised on maintaining hydration - Advised to avoid NSAIDs and use Tylenol  for pain management  Essential hypertension Blood  pressure is well-controlled with current medication regimen. Pulse is at baseline range. - Continue current antihypertensive medications  Mixed hyperlipidemia LDL was 52 in July, indicating good control. Triglycerides were slightly elevated previously. Fasting lipid panel to be conducted today. - Ordered fasting lipid panel - Continue current lipid-lowering therapy  Hypothyroidism Thyroid  function is normal with current levothyroxine  dose of 50 mcg. - Refilled levothyroxine  prescription  Osteopenia Bone density was low but not requiring intervention. Last bone density scan was in ?2025 - Check recent bone density scan results  Vitamin D  deficiency Currently taking vitamin D3 supplementation. - Continue vitamin D3 supplementation  Macular degeneration, left eye Intermittent visual disturbances in the left eye, previously evaluated by an eye doctor. No acute issues reported. - Continue regular follow-up with eye doctor  Eczema of ear Intermittent eczema flare-ups in the ear, managed with topical cream. - Continue use of topical cream as needed  General Health Maintenance Up to date on flu shot, mammogram, and colonoscopy. Discussed colonoscopy screening guidelines and potential use of Cologuard in the future. Discussed CT coronary calcium  scan for cardiovascular risk assessment. - Ordered CT coronary calcium  scan - Continue routine health maintenance screenings     Return in about 6 months (around 05/08/2025) for chronic health conditions.     [name], NP-C      [1]  Outpatient Medications Prior to Visit  Medication Sig   acyclovir  (ZOVIRAX ) 400 MG tablet Take  1 capsule  3 x /day  with Meals  &  2 capsules  at Bedtime for Mouth Ulcers   bisoprolol  (ZEBETA ) 10 MG tablet TAKE 1/2 TO 1 (ONE-HALF TO ONE) TABLET BY MOUTH IN THE MORNING FOR BLOOD PRESSURE   Cholecalciferol (VITAMIN D3) 5000 units TABS Take 5,000 Units by mouth daily.   hydrochlorothiazide  (HYDRODIURIL )  25 MG tablet TAKE 1 TABLET BY MOUTH ONCE DAILY FOR BLOOD PRESSURE AND FOR FLUID   Magnesium 500 MG TABS Take by mouth 2 (  two) times daily.   mometasone (ELOCON) 0.1 % cream Apply 1 Application topically as needed.   Multiple Vitamins-Minerals (PRESERVISION AREDS 2 PO) daily.   olmesartan  (BENICAR ) 20 MG tablet Take 1 tablet (20 mg total) by mouth daily.   rosuvastatin  (CRESTOR ) 20 MG tablet Take one tablet every other day.   UNABLE TO FIND Med Name: Magnalife cream   [DISCONTINUED] levothyroxine  (SYNTHROID ) 50 MCG tablet TAKE 1 TABLET BY MOUTH ONCE DAILY ON AN EMPTY STOMACH NO  ANTACID  MEDS,  CALCIUM   OR  MAGNESIUM  FOR  30  MINUTES.  AVOID  BIOTIN   No facility-administered medications prior to visit.   "

## 2024-11-08 NOTE — Patient Instructions (Addendum)
 Sarah Hartman,  Thank you for taking the time for your Medicare Wellness Visit. I appreciate your continued commitment to your health goals. Please review the care plan we discussed, and feel free to reach out if I can assist you further.  Please note that Annual Wellness Visits do not include a physical exam. Some assessments may be limited, especially if the visit was conducted virtually. If needed, we may recommend an in-person follow-up with your provider.  Ongoing Care Seeing your primary care provider every 3 to 6 months helps us  monitor your health and provide consistent, personalized care.   Referrals If a referral was made during today's visit and you haven't received any updates within two weeks, please contact the referred provider directly to check on the status.  Recommended Screenings:  Health Maintenance  Topic Date Due   COVID-19 Vaccine (5 - 2025-26 season) 07/03/2024   Flu Shot  01/30/2025*   Breast Cancer Screening  04/19/2025   Medicare Annual Wellness Visit  11/08/2025   Osteoporosis screening with Bone Density Scan  05/22/2026   Colon Cancer Screening  09/29/2027   DTaP/Tdap/Td vaccine (3 - Td or Tdap) 12/27/2028   Pneumococcal Vaccine for age over 14  Completed   Hepatitis C Screening  Completed   Meningitis B Vaccine  Aged Out   Zoster (Shingles) Vaccine  Discontinued  *Topic was postponed. The date shown is not the original due date.       11/08/2024   10:00 AM  Advanced Directives  Does Patient Have a Medical Advance Directive? No  Would patient like information on creating a medical advance directive? No - Patient declined    Vision: Annual vision screenings are recommended for early detection of glaucoma, cataracts, and diabetic retinopathy. These exams can also reveal signs of chronic conditions such as diabetes and high blood pressure.  Dental: Annual dental screenings help detect early signs of oral cancer, gum disease, and other conditions linked to  overall health, including heart disease and diabetes.

## 2024-11-08 NOTE — Progress Notes (Signed)
 "  Chief Complaint  Patient presents with   Medicare Wellness     Subjective:   Sarah Hartman is a 74 y.o. female who presents for a Medicare Annual Wellness Visit.  Visit info / Clinical Intake: Medicare Wellness Visit Type:: Subsequent Annual Wellness Visit Persons participating in visit and providing information:: patient Medicare Wellness Visit Mode:: In-person (required for WTM) Interpreter Needed?: No Pre-visit prep was completed: yes AWV questionnaire completed by patient prior to visit?: yes Date:: 11/06/24 Living arrangements:: (!) lives alone Patient's Overall Health Status Rating: good Typical amount of pain: some Does pain affect daily life?: no Are you currently prescribed opioids?: no  Dietary Habits and Nutritional Risks How many meals a day?: 2 Eats fruit and vegetables daily?: yes Most meals are obtained by: preparing own meals In the last 2 weeks, have you had any of the following?: none Diabetic:: no  Functional Status Activities of Daily Living (to include ambulation/medication): Independent Ambulation: Independent with device- listed below Home Assistive Devices/Equipment: Eyeglasses Medication Administration: Independent Home Management (perform basic housework or laundry): Independent Manage your own finances?: yes Primary transportation is: driving Concerns about vision?: no *vision screening is required for WTM* Concerns about hearing?: no  Fall Screening Falls in the past year?: 0 Number of falls in past year: 0 Was there an injury with Fall?: 0 Fall Risk Category Calculator: 0 Patient Fall Risk Level: Low Fall Risk  Fall Risk Patient at Risk for Falls Due to: No Fall Risks Fall risk Follow up: Falls evaluation completed; Falls prevention discussed  Home and Transportation Safety: All rugs have non-skid backing?: (!) no All stairs or steps have railings?: N/A, no stairs Grab bars in the bathtub or shower?: yes Have non-skid surface  in bathtub or shower?: yes Good home lighting?: yes Regular seat belt use?: yes Hospital stays in the last year:: no  Cognitive Assessment Difficulty concentrating, remembering, or making decisions? : no Will 6CIT or Mini Cog be Completed: yes What year is it?: 0 points What month is it?: 0 points Give patient an address phrase to remember (5 components): [address] [place], [place] About what time is it?: 0 points Count backwards from 20 to 1: 0 points (3) Say the months of the year in reverse: 0 points Repeat the address phrase from earlier: 2 points ([place]) 6 CIT Score: 2 points  Advance Directives (For Healthcare) Does Patient Have a Medical Advance Directive?: No Would patient like information on creating a medical advance directive?: No - Patient declined  Reviewed/Updated  Reviewed/Updated: Reviewed All (Medical, Surgical, Family, Medications, Allergies, Care Teams, Patient Goals)    Allergies (verified) Codeine   Current Medications (verified) Outpatient Encounter Medications as of 11/08/2024  Medication Sig   acyclovir  (ZOVIRAX ) 400 MG tablet Take  1 capsule  3 x /day  with Meals  &  2 capsules  at Bedtime for Mouth Ulcers   bisoprolol  (ZEBETA ) 10 MG tablet TAKE 1/2 TO 1 (ONE-HALF TO ONE) TABLET BY MOUTH IN THE MORNING FOR BLOOD PRESSURE   Cholecalciferol (VITAMIN D3) 5000 units TABS Take 5,000 Units by mouth daily.   hydrochlorothiazide  (HYDRODIURIL ) 25 MG tablet TAKE 1 TABLET BY MOUTH ONCE DAILY FOR BLOOD PRESSURE AND FOR FLUID   levothyroxine  (SYNTHROID ) 50 MCG tablet TAKE 1 TABLET BY MOUTH ONCE DAILY ON AN EMPTY STOMACH NO  ANTACID  MEDS,  CALCIUM   OR  MAGNESIUM  FOR  30  MINUTES.  AVOID  BIOTIN   Magnesium 500 MG TABS Take by  mouth 2 (two) times daily.   mometasone (ELOCON) 0.1 % cream Apply 1 Application topically as needed.   Multiple Vitamins-Minerals (PRESERVISION AREDS 2 PO) daily.   olmesartan  (BENICAR ) 20 MG tablet Take 1 tablet (20 mg total) by mouth daily.    rosuvastatin  (CRESTOR ) 20 MG tablet Take one tablet every other day.   UNABLE TO FIND Med Name: Magnalife cream   No facility-administered encounter medications on file as of 11/08/2024.    History: Past Medical History:  Diagnosis Date   Adenomatous colon polyp    Allergy    GERD (gastroesophageal reflux disease)    Heart murmur    history of as teenager    Herpes labialis 05/11/2024   Hyperlipidemia    IBS (irritable bowel syndrome)    Labile hypertension    white coat per pt   Macular degeneration of left eye    RLS (restless legs syndrome)    Thyroid  disease    hypothyroid   Vitamin D  deficiency    Past Surgical History:  Procedure Laterality Date   COLONOSCOPY     POLYPECTOMY     TENDON RECONSTRUCTION Right 04/27/2013   Procedure: RIGHT ELBOW DEBRIDE/REPAIR/EXPLORE - LATERAL HUMERAL EPICONDYLE;  Surgeon: Toribio JULIANNA Chancy, MD;  Location: [facility];  Service: Orthopedics;  Laterality: Right;   TUBAL LIGATION     Family History  Problem Relation Age of Onset   Hypertension Father    Cerebral aneurysm Father    Alzheimer's disease Mother    Stomach cancer Paternal Grandmother 28   Colon cancer Neg Hx    Esophageal cancer Neg Hx    Rectal cancer Neg Hx    Social History   Occupational History   Not on file  Tobacco Use   Smoking status: Former    Current packs/day: 0.00    Average packs/day: 2.0 packs/day for 14.5 years (28.9 ttl pk-yrs)    Types: Cigarettes    Start date: 11/02/1966    Quit date: 04/24/1981    Years since quitting: 43.5   Smokeless tobacco: Never  Vaping Use   Vaping status: Never Used  Substance and Sexual Activity   Alcohol use: Yes    Alcohol/week: 3.0 standard drinks of alcohol    Types: 3 Glasses of wine per week    Comment: occas   Drug use: No   Sexual activity: Not Currently    Birth control/protection: Post-menopausal, Surgical   Tobacco Counseling Counseling given: Yes  SDOH Screenings   Food Insecurity: No  Food Insecurity (11/08/2024)  Housing: Low Risk (11/08/2024)  Transportation Needs: No Transportation Needs (11/08/2024)  Utilities: Not At Risk (11/08/2024)  Alcohol Screen: Low Risk (11/06/2024)  Depression (PHQ2-9): Low Risk (11/08/2024)  Financial Resource Strain: Low Risk (11/06/2024)  Physical Activity: Sufficiently Active (11/08/2024)  Social Connections: Moderately Isolated (11/08/2024)  Stress: No Stress Concern Present (11/08/2024)  Tobacco Use: Medium Risk (11/08/2024)  Health Literacy: Adequate Health Literacy (11/08/2024)   See flowsheets for full screening details  Depression Screen PHQ 2 & 9 Depression Scale- Over the past 2 weeks, how often have you been bothered by any of the following problems? Little interest or pleasure in doing things: 0 Feeling down, depressed, or hopeless (PHQ Adolescent also includes...irritable): 0 PHQ-2 Total Score: 0 Trouble falling or staying asleep, or sleeping too much: 1 (due to restless legs; gets 4-5hrs of sleep) Feeling tired or having little energy: 1 Poor appetite or overeating (PHQ Adolescent also includes...weight loss): 0 Feeling bad about yourself -  or that you are a failure or have let yourself or your family down: 0 Trouble concentrating on things, such as reading the newspaper or watching television (PHQ Adolescent also includes...like school work): 0 Moving or speaking so slowly that other people could have noticed. Or the opposite - being so fidgety or restless that you have been moving around a lot more than usual: 0 Thoughts that you would be better off dead, or of hurting yourself in some way: 0 PHQ-9 Total Score: 2 If you checked off any problems, how difficult have these problems made it for you to do your work, take care of things at home, or get along with other people?: Not difficult at all  Depression Treatment Depression Interventions/Treatment : [id number] Score <4 Follow-up Not Indicated     Goals Addressed               This  Visit's Progress     Patient Stated (pt-stated)        Patient stated she plans to continue managing health             Objective:    Today's Vitals   11/08/24 0959  BP: 102/62  Pulse: 60  SpO2: 97%  Weight: 151 lb 12.8 oz (68.9 kg)  Height: 5' 3 (1.6 m)   Body mass index is 26.89 kg/m.  Hearing/Vision screen Hearing Screening - Comments:: Denies hearing difficulties   Vision Screening - Comments:: Wears rx glasses - up to date with routine eye exams with [facility] Immunizations and Health Maintenance Health Maintenance  Topic Date Due   COVID-19 Vaccine (5 - 2025-26 season) 07/03/2024   Influenza Vaccine  01/30/2025 (Originally 06/02/2024)   Mammogram  04/19/2025   Medicare Annual Wellness (AWV)  11/08/2025   Bone Density Scan  05/22/2026   Colonoscopy  09/29/2027   DTaP/Tdap/Td (3 - Td or Tdap) 12/27/2028   Pneumococcal Vaccine: 50+ Years  Completed   Hepatitis C Screening  Completed   Meningococcal B Vaccine  Aged Out   Zoster Vaccines- Shingrix  Discontinued        Assessment/Plan:  This is a routine wellness examination for Sarah Hartman.  Patient Care Team: Lendia Boby CROME, NP-C as PCP - General (Family Medicine) Beverley Toribio FALCON, MD (Inactive) as Consulting Physician (Orthopedic Surgery) Abran Norleen SAILOR, MD as Consulting Physician (Gastroenterology) Nieves Cough, MD as Consulting Physician (Urology) Rosalynn LELON Ingle, MD (Inactive) as Consulting Physician (Obstetrics and Gynecology) Nancyann DOROTHA Ebbing, M.D., PA [facility])  I have personally reviewed and noted the following in the patients chart:   Medical and social history Use of alcohol, tobacco or illicit drugs  Current medications and supplements including opioid prescriptions. Functional ability and status Nutritional status Physical activity Advanced directives List of other physicians Hospitalizations, surgeries, and ER visits in previous 12 months Vitals Screenings to include cognitive,  depression, and falls Referrals and appointments  No orders of the defined types were placed in this encounter.  In addition, I have reviewed and discussed with patient certain preventive protocols, quality metrics, and best practice recommendations. A written personalized care plan for preventive services as well as general preventive health recommendations were provided to patient.   Verdie CHRISTELLA Saba, CMA   11/08/2024   Return in 1 year (on 11/08/2025).  After Visit Summary: (In Person-Declined) Patient declined AVS at this time.  Nurse Notes: CPE appt today w/PCP; Appointment(s) made: (scheduled 2027 AWV/CPE appts)  "

## 2024-11-08 NOTE — Progress Notes (Signed)
 Her kidney function improved slightly.  Her cholesterol looks good including triglycerides.  Nothing concerning on these results.

## 2024-11-15 ENCOUNTER — Encounter: Payer: PPO | Admitting: Internal Medicine

## 2024-11-22 ENCOUNTER — Encounter: Payer: PPO | Admitting: Internal Medicine

## 2024-11-23 ENCOUNTER — Ambulatory Visit (HOSPITAL_BASED_OUTPATIENT_CLINIC_OR_DEPARTMENT_OTHER)
Admission: RE | Admit: 2024-11-23 | Discharge: 2024-11-23 | Disposition: A | Discharge status: Home / Self Care | Payer: Self-pay | Source: Ambulatory Visit | Attending: Family Medicine | Admitting: Family Medicine

## 2024-11-23 DIAGNOSIS — E782 Mixed hyperlipidemia: Secondary | ICD-10-CM | POA: Insufficient documentation

## 2024-11-23 NOTE — Progress Notes (Signed)
 Her CT Coronary calcium  score is 0. This is great news.

## 2025-11-09 ENCOUNTER — Ambulatory Visit

## 2025-11-09 ENCOUNTER — Encounter: Admitting: Family Medicine
